# Patient Record
Sex: Female | Born: 1985
Health system: Southern US, Community
[De-identification: ages and names within clinical notes are randomized; demographics above are authoritative.]

## PROBLEM LIST (undated history)

## (undated) DIAGNOSIS — K219 Gastro-esophageal reflux disease without esophagitis: Secondary | ICD-10-CM

## (undated) DIAGNOSIS — K589 Irritable bowel syndrome without diarrhea: Secondary | ICD-10-CM

## (undated) DIAGNOSIS — F329 Major depressive disorder, single episode, unspecified: Secondary | ICD-10-CM

## (undated) DIAGNOSIS — N838 Other noninflammatory disorders of ovary, fallopian tube and broad ligament: Secondary | ICD-10-CM

## (undated) DIAGNOSIS — N946 Dysmenorrhea, unspecified: Secondary | ICD-10-CM

## (undated) DIAGNOSIS — R112 Nausea with vomiting, unspecified: Secondary | ICD-10-CM

## (undated) DIAGNOSIS — R87619 Unspecified abnormal cytological findings in specimens from cervix uteri: Secondary | ICD-10-CM

## (undated) DIAGNOSIS — Z9889 Other specified postprocedural states: Secondary | ICD-10-CM

## (undated) DIAGNOSIS — F32A Depression, unspecified: Secondary | ICD-10-CM

## (undated) DIAGNOSIS — Z8619 Personal history of other infectious and parasitic diseases: Secondary | ICD-10-CM

## (undated) DIAGNOSIS — K209 Esophagitis, unspecified without bleeding: Secondary | ICD-10-CM

## (undated) DIAGNOSIS — K449 Diaphragmatic hernia without obstruction or gangrene: Secondary | ICD-10-CM

## (undated) DIAGNOSIS — F419 Anxiety disorder, unspecified: Secondary | ICD-10-CM

## (undated) HISTORY — PX: CERVICAL BIOPSY  W/ LOOP ELECTRODE EXCISION: SUR135

## (undated) HISTORY — DX: Depression, unspecified: F32.A

## (undated) HISTORY — DX: Personal history of other infectious and parasitic diseases: Z86.19

## (undated) HISTORY — DX: Unspecified abnormal cytological findings in specimens from cervix uteri: R87.619

## (undated) HISTORY — DX: Gastro-esophageal reflux disease without esophagitis: K21.9

## (undated) HISTORY — DX: Major depressive disorder, single episode, unspecified: F32.9

## (undated) HISTORY — DX: Diaphragmatic hernia without obstruction or gangrene: K44.9

## (undated) HISTORY — DX: Irritable bowel syndrome, unspecified: K58.9

## (undated) HISTORY — DX: Anxiety disorder, unspecified: F41.9

## (undated) HISTORY — DX: Dysmenorrhea, unspecified: N94.6

## (undated) HISTORY — DX: Esophagitis, unspecified without bleeding: K20.90

## (undated) HISTORY — PX: COLPOSCOPY: SHX161

---
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
## 2005-05-24 ENCOUNTER — Ambulatory Visit: Payer: Self-pay | Admitting: Family Medicine

## 2005-10-10 ENCOUNTER — Ambulatory Visit: Payer: Self-pay | Admitting: Family Medicine

## 2006-05-08 ENCOUNTER — Ambulatory Visit: Payer: Self-pay | Admitting: Family Medicine

## 2006-05-13 ENCOUNTER — Ambulatory Visit: Payer: Self-pay | Admitting: Family Medicine

## 2006-10-28 HISTORY — PX: WISDOM TOOTH EXTRACTION: SHX21

## 2006-10-29 ENCOUNTER — Ambulatory Visit: Payer: Self-pay | Admitting: Family Medicine

## 2007-06-22 ENCOUNTER — Ambulatory Visit: Payer: Self-pay | Admitting: Family Medicine

## 2007-06-22 DIAGNOSIS — F332 Major depressive disorder, recurrent severe without psychotic features: Secondary | ICD-10-CM | POA: Insufficient documentation

## 2007-06-28 ENCOUNTER — Emergency Department: Payer: Self-pay | Admitting: Emergency Medicine

## 2007-07-07 ENCOUNTER — Ambulatory Visit: Payer: Self-pay | Admitting: Professional

## 2007-09-10 ENCOUNTER — Ambulatory Visit: Payer: Self-pay | Admitting: Family Medicine

## 2007-09-10 DIAGNOSIS — K219 Gastro-esophageal reflux disease without esophagitis: Secondary | ICD-10-CM | POA: Insufficient documentation

## 2007-09-10 DIAGNOSIS — F3132 Bipolar disorder, current episode depressed, moderate: Secondary | ICD-10-CM | POA: Insufficient documentation

## 2007-10-08 ENCOUNTER — Ambulatory Visit: Payer: Self-pay | Admitting: Family Medicine

## 2007-10-08 ENCOUNTER — Encounter: Payer: Self-pay | Admitting: Family Medicine

## 2007-10-08 ENCOUNTER — Other Ambulatory Visit: Admission: RE | Admit: 2007-10-08 | Discharge: 2007-10-08 | Payer: Self-pay | Admitting: Family Medicine

## 2007-10-09 ENCOUNTER — Encounter: Payer: Self-pay | Admitting: Family Medicine

## 2007-10-12 LAB — CONVERTED CEMR LAB: Pap Smear: NORMAL

## 2007-10-13 ENCOUNTER — Encounter (INDEPENDENT_AMBULATORY_CARE_PROVIDER_SITE_OTHER): Payer: Self-pay | Admitting: *Deleted

## 2007-10-13 LAB — CONVERTED CEMR LAB
HCV Ab: NEGATIVE
Hep B Core Total Ab: NEGATIVE
Hep B S Ab: POSITIVE — AB

## 2007-12-17 ENCOUNTER — Ambulatory Visit: Payer: Self-pay | Admitting: Family Medicine

## 2008-01-22 ENCOUNTER — Ambulatory Visit: Payer: Self-pay | Admitting: Family Medicine

## 2008-10-18 ENCOUNTER — Ambulatory Visit: Payer: Self-pay | Admitting: Family Medicine

## 2010-05-30 ENCOUNTER — Ambulatory Visit: Payer: Self-pay | Admitting: Family Medicine

## 2010-07-27 ENCOUNTER — Telehealth (INDEPENDENT_AMBULATORY_CARE_PROVIDER_SITE_OTHER): Payer: Self-pay | Admitting: *Deleted

## 2010-07-27 ENCOUNTER — Ambulatory Visit: Payer: Self-pay | Admitting: Family Medicine

## 2010-07-27 LAB — CONVERTED CEMR LAB
Eosinophils Absolute: 0.1 10*3/uL (ref 0.0–0.7)
Glucose, Bld: 87 mg/dL (ref 70–99)
HCT: 37.8 % (ref 36.0–46.0)
Lymphs Abs: 1.8 10*3/uL (ref 0.7–4.0)
MCHC: 34.4 g/dL (ref 30.0–36.0)
MCV: 88.4 fL (ref 78.0–100.0)
Monocytes Absolute: 0.4 10*3/uL (ref 0.1–1.0)
Neutrophils Relative %: 56.2 % (ref 43.0–77.0)
Platelets: 198 10*3/uL (ref 150.0–400.0)

## 2010-08-08 ENCOUNTER — Other Ambulatory Visit: Admission: RE | Admit: 2010-08-08 | Discharge: 2010-08-08 | Payer: Self-pay | Admitting: Family Medicine

## 2010-08-08 ENCOUNTER — Ambulatory Visit: Payer: Self-pay | Admitting: Family Medicine

## 2010-08-10 LAB — CONVERTED CEMR LAB: Hepatitis B Surface Ag: NEGATIVE

## 2010-08-16 DIAGNOSIS — R87619 Unspecified abnormal cytological findings in specimens from cervix uteri: Secondary | ICD-10-CM | POA: Insufficient documentation

## 2010-08-16 LAB — CONVERTED CEMR LAB

## 2010-09-10 ENCOUNTER — Ambulatory Visit: Payer: Self-pay | Admitting: Obstetrics & Gynecology

## 2010-09-11 ENCOUNTER — Ambulatory Visit: Payer: Self-pay | Admitting: Family Medicine

## 2010-09-26 ENCOUNTER — Ambulatory Visit: Payer: Self-pay | Admitting: Family Medicine

## 2010-09-26 LAB — CONVERTED CEMR LAB
Calcium: 9.8 mg/dL (ref 8.4–10.5)
GFR calc non Af Amer: 108.43 mL/min (ref >60)
Glucose, Bld: 74 mg/dL (ref 70–99)
Potassium: 4.7 meq/L (ref 3.5–5.1)
Sodium: 141 meq/L (ref 135–145)
Vitamin B-12: 393 pg/mL (ref 211–911)

## 2010-10-08 ENCOUNTER — Ambulatory Visit: Payer: Self-pay | Admitting: Obstetrics & Gynecology

## 2010-10-08 ENCOUNTER — Encounter: Payer: Self-pay | Admitting: Obstetrics and Gynecology

## 2010-10-08 LAB — CONVERTED CEMR LAB: Yeast Wet Prep HPF POC: NONE SEEN

## 2010-11-12 ENCOUNTER — Ambulatory Visit
Admission: RE | Admit: 2010-11-12 | Discharge: 2010-11-12 | Payer: Self-pay | Source: Home / Self Care | Attending: Family Medicine | Admitting: Family Medicine

## 2010-11-27 NOTE — Assessment & Plan Note (Signed)
Summary: PAIN IN LEGS  CYD   Vital Signs:  Patient profile:   25 year old female Height:      64.5 inches Weight:      156.75 pounds BMI:     26.59 Temp:     97.3 degrees F oral Pulse rate:   80 / minute Pulse rhythm:   regular BP sitting:   92 / 80  (right arm) Cuff size:   regular  Vitals Entered By: Linde Gillis CMA Duncan Dull) (September 26, 2010 11:21 AM) CC: pain in legs   History of Present Illness: Pt here for pain in the lower legs, mostly in the calf muscles, she denies unusual exercise, work or activity. It has been happening since last Fri (5 days) having it most days, frequent. She is not aware of being able to make it happen. She is on OCPS, this one since 6 mos ago. She is otherwise not on any other medication. She does not take any OTC meds. Working seems to make it worse...standing tends to bother her. 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 No new activity at work in the last 6 weeks. She thinks her job is ok. She has  noticed a little sweeling of the upper ankle of both legs.  She has never really eaten healthy foods. She has not started a new exercise regimen and does not exercise regularly. She has not changed her diet lately.   Problems Prior to Update: 1)  Abnormal Glandular Papanicolaou Smear of Cervix  (ICD-795.00) 2)  Routine Gynecological Examination  (ICD-V72.31) 3)  Screening Examination For Venereal Disease  (ICD-V74.5) 4)  Preventive Health Care  (ICD-V70.0) 5)  Bipolar I D/o Most Recent Epis Depressed Mod  (ICD-296.52) 6)  Gerd  (ICD-530.81) 7)  Depressive Disorder, Rcr, Severe  (ICD-296.33)  Medications Prior to Update: 1)  Sprintec 28 0.25-35 Mg-Mcg Tabs (Norgestimate-Eth Estradiol) .Marland Kitchen.. 1 Tab By Mouth Daily  Allergies (verified): No Known Drug Allergies  Physical Exam  General:  Well-developed,well-nourished,in no acute distress; alert,appropriate and cooperative throughout examination Extremities:  No clubbing,  cyanosis, edema, or deformity noted with normal full range of motion of all joints.  Calves NT to palpation, Homan's negative. No problems moving or getting on or off exam table.   Impression & Recommendations:  Problem # 1:  MUSCLE SPASM, CALVES BILAT (ICD-728.85) Assessment New See instructions. Will get Bmet for metabolites, Mg and B12. Discussed OCPs and clotting but bilat presentation trivializes this.  Increased activity should help. Orders: Venipuncture (11914) TLB-BMP (Basic Metabolic Panel-BMET) (80048-METABOL) TLB-Magnesium (Mg) (83735-MG) TLB-B12, Serum-Total ONLY (78295-A21)  Complete Medication List: 1)  Sprintec 28 0.25-35 Mg-mcg Tabs (Norgestimate-eth estradiol) .Marland Kitchen.. 1 tab by mouth daily  Patient Instructions: 1)  Call for lab results tomm nite.  2)  Try regular vinegar or mustard. 3)  Start regular exercise regimen as discussed. 4)  Be active at work station. 5)  RTC or call if not improved in a few weeks or worsens anytime.   Orders Added: 1)  Venipuncture [36415] 2)  TLB-BMP (Basic Metabolic Panel-BMET) [80048-METABOL] 3)  TLB-Magnesium (Mg) [83735-MG] 4)  TLB-B12, Serum-Total ONLY [82607-B12] 5)  Est. Patient Level III [30865]    Current Allergies (reviewed today): No known allergies

## 2010-11-27 NOTE — Progress Notes (Signed)
----   Converted from flag ---- ---- 07/27/2010 11:00 AM, Kerby Nora MD wrote: Paula Evans ---- 07/27/2010 10:42 AM, Liane Comber CMA (AAMA) wrote: Which labs do you want drawn on pt, she is scheduled for glucose, but it also has a dx code of 280.9 (iron def?) cannot find orders in chart. ------------------------------

## 2010-11-27 NOTE — Assessment & Plan Note (Signed)
Summary: 10 AM BIRTH CONTROL APPT/DLO   Vital Signs:  Patient profile:   25 year old female Height:      64.5 inches Weight:      153.2 pounds BMI:     25.98 Temp:     98.5 degrees F oral Pulse rate:   80 / minute Pulse rhythm:   regular BP sitting:   90 / 60  (left arm) Cuff size:   regular  Vitals Entered By: Benny Lennert CMA Duncan Dull) (May 30, 2010 10:01 AM)  History of Present Illness: Chief complaint birth control  Heavy menses, pain with menses. Cramping, severe..causes her to leave work...mainly on first day. Has always had this issue. She is interested in birth control for regulation of menses.  She is not currently sexually active.  Recent menses finished...lasts 5 days. 4-5 pads each day..soaked.  No lightheadedness. No fatigue.  Never been on birth control before.    No smoking.  No clots personal or family history.  No clotting disorders.  Problems Prior to Update: 1)  Cervicalgia  (ICD-723.1) 2)  Preventive Health Care  (ICD-V70.0) 3)  Bipolar I D/o Most Recent Epis Depressed Mod  (ICD-296.52) 4)  Gerd  (ICD-530.81) 5)  Depressive Disorder, Rcr, Severe  (ICD-296.33)  Current Medications (verified): 1)  Prilosec 20 Mg  Cpdr (Omeprazole) .... Take 1 Tablet By Mouthprn 2)  Zoloft 50 Mg  Tabs (Sertraline Hcl) .... One Tab By Mouth Qd 3)  Flexeril 10 Mg  Tabs (Cyclobenzaprine Hcl) .... 1/2 0-1 Tab By Mouth Once Daily As Needed 4)  Tramadol Hcl 50 Mg Tabs (Tramadol Hcl) .... Take One Tablet By Mouth Every 4 To 6 Hours 5)  Sprintec 28 0.25-35 Mg-Mcg Tabs (Norgestimate-Eth Estradiol) .Marland Kitchen.. 1 Tab By Mouth Daily  Allergies (verified): No Known Drug Allergies  Past History:  Past medical, surgical, family and social histories (including risk factors) reviewed, and no changes noted (except as noted below).  Past Medical History: Reviewed history from 01/22/2008 and no changes required. Current Problems:  PREVENTIVE HEALTH CARE (ICD-V70.0) BIPOLAR I D/O  MOST RECENT EPIS DEPRESSED MOD (ICD-296.52) GERD (ICD-530.81) DEPRESSIVE DISORDER, RCR, SEVERE (ICD-296.33)    Family History: Reviewed history from 10/08/2007 and no changes required. Father: healthy Mother: high cholesterol Siblings: sister healthy PGF: HTN PGM: HTN MGM: breast cancer  Social History: Reviewed history from 10/08/2007 and no changes required. Marital Status: Single Children: None Occupation: Will start [employer] 1/06, [employer] Lives with Mom Never Smoked Alcohol use-no Drug use-no  Review of Systems General:  Denies fatigue and fever. CV:  Denies chest pain or discomfort. Resp:  Denies shortness of breath.  Physical Exam  General:  Well-developed,well-nourished,in no acute distress; alert,appropriate and cooperative throughout examination Mouth:  Oral mucosa and oropharynx without lesions or exudates.  Teeth in good repair. Neck:  no carotid bruit or thyromegaly no cervical or supraclavicular lymphadenopathy  Lungs:  Normal respiratory effort, chest expands symmetrically. Lungs are clear to auscultation, no crackles or wheezes. Heart:  Normal rate and regular rhythm. S1 and S2 normal without gallop, murmur, click, rub or other extra sounds. Abdomen:  Bowel sounds positive,abdomen soft and non-tender without masses, organomegaly or hernias noted.   Impression & Recommendations:  Problem # 1:  DYSFUNCTIONAL UTERINE BLEEDING (ICD-626.8) Will return for pelvic exam.  EWill start on OCP...discussed SE and benifits.   Complete Medication List: 1)  Prilosec 20 Mg Cpdr (Omeprazole) .... Take 1 tablet by mouthprn 2)  Zoloft 50 Mg Tabs (Sertraline hcl) .Marland KitchenMarland KitchenMarland Kitchen  One tab by mouth qd 3)  Flexeril 10 Mg Tabs (Cyclobenzaprine hcl) .... 1/2 0-1 tab by mouth once daily as needed 4)  Tramadol Hcl 50 Mg Tabs (Tramadol hcl) .... Take one tablet by mouth every 4 to 6 hours 5)  Sprintec 28 0.25-35 Mg-mcg Tabs (Norgestimate-eth estradiol) .Marland Kitchen.. 1 tab by mouth daily  Patient  Instructions: 1)  Schedule CPX/PAP in next 1-2 months. Prescriptions: SPRINTEC 28 0.25-35 MG-MCG TABS (NORGESTIMATE-ETH ESTRADIOL) 1 tab by mouth daily  #1 x 2   Entered and Authorized by:   Kerby Nora MD   Signed by:   Kerby Nora MD on 05/30/2010   Method used:   Electronically to        [facility] (retail)       [address]       [place], [place]  [address]       Ph: [phone]       Fax: [phone]   RxID:   [id number]   Current Allergies (reviewed today): No known allergies

## 2010-11-27 NOTE — Assessment & Plan Note (Signed)
Summary: cpx/pap/dlo   Vital Signs:  Patient profile:   25 year old female Height:      64.5 inches Weight:      157.0 pounds BMI:     26.63 Temp:     99.0 degrees F oral Pulse rate:   80 / minute Pulse rhythm:   regular BP sitting:   90 / 60  (left arm) Cuff size:   regular  Vitals Entered By: Benny Lennert CMA Duncan Dull) (August 08, 2010 11:43 AM)  History of Present Illness: Chief complaint cpx with pap  The patient is here for annual wellness exam and preventative care.     GERD, well controlled..off prilosec.  Depression, bipolar stable..not on any medicaiton currently.  Doing well on sprintec...no current sexual activity.  Anemia in past per record (pt does not recall)..resolved..nml Hg   Problems Prior to Update: 1)  Unspecified Iron Deficiency Anemia  (ICD-280.9) 2)  Dysfunctional Uterine Bleeding  (ICD-626.8) 3)  Cervicalgia  (ICD-723.1) 4)  Preventive Health Care  (ICD-V70.0) 5)  Bipolar I D/o Most Recent Epis Depressed Mod  (ICD-296.52) 6)  Gerd  (ICD-530.81) 7)  Depressive Disorder, Rcr, Severe  (ICD-296.33)  Current Medications (verified): 1)  Sprintec 28 0.25-35 Mg-Mcg Tabs (Norgestimate-Eth Estradiol) .Marland Kitchen.. 1 Tab By Mouth Daily  Allergies (verified): No Known Drug Allergies  Past History:  Past medical, surgical, family and social histories (including risk factors) reviewed, and no changes noted (except as noted below).  Past Medical History: Reviewed history from 01/22/2008 and no changes required. Current Problems:  PREVENTIVE HEALTH CARE (ICD-V70.0) BIPOLAR I D/O MOST RECENT EPIS DEPRESSED MOD (ICD-296.52) GERD (ICD-530.81) DEPRESSIVE DISORDER, RCR, SEVERE (ICD-296.33)    Family History: Reviewed history from 10/08/2007 and no changes required. Father: healthy Mother: high cholesterol Siblings: sister healthy PGF: HTN PGM: HTN MGM: breast cancer  Social History: Reviewed history from 10/08/2007 and no changes required. Marital  Status: Single Children: None Occupation: Will start [employer] 1/06, [employer] Lives with Mom Never Smoked Alcohol use-no Drug use-no  Review of Systems General:  Denies fatigue. CV:  Denies chest pain or discomfort. Resp:  Denies shortness of breath. GI:  Complains of abdominal pain; denies bloody stools, constipation, and diarrhea; In last month.Marland Kitchenafter eatng sugary junk food..pulling pain in right side laterally.. GU:  Denies abnormal vaginal bleeding and dysuria. MS:  Denies joint pain; no neck pain seen at urgent care for tendinitis.Marland Kitchenin last month..resolved now.  Physical Exam  General:  Well-developed,well-nourished,in no acute distress; alert,appropriate and cooperative throughout examination Eyes:  No corneal or conjunctival inflammation noted. EOMI. Perrla. Funduscopic exam benign, without hemorrhages, exudates or papilledema. Vision grossly normal. Ears:  External ear exam shows no significant lesions or deformities.  Otoscopic examination reveals clear canals, tympanic membranes are intact bilaterally without bulging, retraction, inflammation or discharge. Hearing is grossly normal bilaterally. Nose:  External nasal examination shows no deformity or inflammation. Nasal mucosa are pink and moist without lesions or exudates. Mouth:  Oral mucosa and oropharynx without lesions or exudates.  Teeth in good repair. Neck:  no carotid bruit or thyromegaly no cervical or supraclavicular lymphadenopathy  Chest Wall:  No deformities, masses, or tenderness noted. Breasts:  No mass, nodules, thickening, tenderness, bulging, retraction, inflamation, nipple discharge or skin changes noted.   Lungs:  Normal respiratory effort, chest expands symmetrically. Lungs are clear to auscultation, no crackles or wheezes. Heart:  Normal rate and regular rhythm. S1 and S2 normal without gallop, murmur, click, rub or other extra sounds. Abdomen:  Bowel  sounds positive,abdomen soft and non-tender without  masses, organomegaly or hernias noted. Genitalia:  Pelvic Exam:        External: normal female genitalia without lesions or masses        Vagina: normal without lesions or masses        Cervix: normal without lesions or masses        Adnexa: normal bimanual exam without masses or fullness        Uterus: normal by palpation        Pap smear: performed Msk:  No deformity or scoliosis noted of thoracic or lumbar spine.   Pulses:  R and L posterior tibial pulses are full and equal bilaterally  Extremities:  no edmea Skin:  Intact without suspicious lesions or rashes Psych:  Cognition and judgment appear intact. Alert and cooperative with normal attention span and concentration. No apparent delusions, illusions, hallucinations   Impression & Recommendations:  Problem # 1:  PREVENTIVE HEALTH CARE (ICD-V70.0) The patient's preventative maintenance and recommended screening tests for an annual wellness exam were reviewed in full today. Brought up to date unless services declined.  Counselled on the importance of diet, exercise, and its role in overall health and mortality. The patient's FH and SH was reviewed, including their home life, tobacco status, and drug and alcohol status.     Problem # 2:  ROUTINE GYNECOLOGICAL EXAMINATION (ICD-V72.31) PAP pending.  STD screening.   Complete Medication List: 1)  Sprintec 28 0.25-35 Mg-mcg Tabs (Norgestimate-eth estradiol) .Marland Kitchen.. 1 tab by mouth daily  Other Orders: T-Hepatitis Acute Panel ([id number]) T-RPR (Syphilis) (09811-91478) T-HIV Antibody  (Reflex) ([id number])  Patient Instructions: 1)  Please schedule a follow-up appointment in 1 year.  2)  Work on [employer], sweets. Work on exercisie 3-5 times a week.  Current Allergies (reviewed today): No known allergies   Last Flu Vaccine:  refused (10/08/2007 10:57:20 AM) Flu Vaccine Result Date:  08/08/2010 Flu Vaccine Result:  at work Flu Vaccine Next Due:  1 yr  Appended  Document: cpx/pap/dlo

## 2011-02-11 ENCOUNTER — Other Ambulatory Visit: Payer: Self-pay | Admitting: Family Medicine

## 2011-03-07 ENCOUNTER — Ambulatory Visit (INDEPENDENT_AMBULATORY_CARE_PROVIDER_SITE_OTHER): Payer: Private Health Insurance - Indemnity

## 2011-03-07 DIAGNOSIS — N760 Acute vaginitis: Secondary | ICD-10-CM

## 2011-03-07 DIAGNOSIS — N898 Other specified noninflammatory disorders of vagina: Secondary | ICD-10-CM

## 2011-03-07 DIAGNOSIS — N899 Noninflammatory disorder of vagina, unspecified: Secondary | ICD-10-CM

## 2014-07-03 ENCOUNTER — Emergency Department: Payer: Self-pay | Admitting: Emergency Medicine

## 2014-07-03 LAB — CBC
HCT: 42.7 % (ref 35.0–47.0)
HGB: 14 g/dL (ref 12.0–16.0)
MCH: 27.5 pg (ref 26.0–34.0)
MCHC: 32.9 g/dL (ref 32.0–36.0)
MCV: 84 fL (ref 80–100)
Platelet: 260 10*3/uL (ref 150–440)
RBC: 5.1 10*6/uL (ref 3.80–5.20)
RDW: 14.1 % (ref 11.5–14.5)
WBC: 6.9 10*3/uL (ref 3.6–11.0)

## 2014-07-03 LAB — BASIC METABOLIC PANEL
ANION GAP: 4 — AB (ref 7–16)
BUN: 11 mg/dL (ref 7–18)
CHLORIDE: 104 mmol/L (ref 98–107)
CO2: 30 mmol/L (ref 21–32)
Calcium, Total: 10 mg/dL (ref 8.5–10.1)
Creatinine: 0.92 mg/dL (ref 0.60–1.30)
EGFR (African American): >60
EGFR (Non-African Amer.): >60
Glucose: 112 mg/dL — ABNORMAL HIGH (ref 65–99)
Osmolality: 276 (ref 275–301)
Potassium: 3.9 mmol/L (ref 3.5–5.1)
SODIUM: 138 mmol/L (ref 136–145)

## 2014-07-03 LAB — TROPONIN I: Troponin-I: <0.02 ng/mL

## 2014-07-04 LAB — D-DIMER(ARMC): D-Dimer: 223 ng/ml

## 2014-07-12 ENCOUNTER — Ambulatory Visit (INDEPENDENT_AMBULATORY_CARE_PROVIDER_SITE_OTHER): Payer: 59 | Admitting: Family Medicine

## 2014-07-12 ENCOUNTER — Encounter: Payer: Self-pay | Admitting: Family Medicine

## 2014-07-12 VITALS — BP 110/80 | HR 75 | Temp 98.3°F | Ht 65.0 in | Wt 203.2 lb

## 2014-07-12 DIAGNOSIS — K219 Gastro-esophageal reflux disease without esophagitis: Secondary | ICD-10-CM

## 2014-07-12 DIAGNOSIS — G5602 Carpal tunnel syndrome, left upper limb: Secondary | ICD-10-CM | POA: Insufficient documentation

## 2014-07-12 DIAGNOSIS — G5600 Carpal tunnel syndrome, unspecified upper limb: Secondary | ICD-10-CM

## 2014-07-12 NOTE — Patient Instructions (Addendum)
Decrease reflux triggers like caffeine, soda, spicy, tomato, junk food, chocolate, citris.  Consider a trial of prilosec 2 x 20 mg tablets daily 2-3 weeks then taper it off. Schedule CPX with fasting labs prior in next few months.  Work on low cholesterol diet.  Wear carpal tunnel brace at night x 2 weeks.  Fat and Cholesterol Control Diet Fat and cholesterol levels in your blood and organs are influenced by your diet. High levels of fat and cholesterol may lead to diseases of the heart, small and large blood vessels, gallbladder, liver, and pancreas. CONTROLLING FAT AND CHOLESTEROL WITH DIET Although exercise and lifestyle factors are important, your diet is key. That is because certain foods are known to raise cholesterol and others to lower it. The goal is to balance foods for their effect on cholesterol and more importantly, to replace saturated and trans fat with other types of fat, such as monounsaturated fat, polyunsaturated fat, and omega-3 fatty acids. On average, a person should consume no more than 15 to 17 g of saturated fat daily. Saturated and trans fats are considered "bad" fats, and they will raise LDL cholesterol. Saturated fats are primarily found in animal products such as meats, butter, and cream. However, that does not mean you need to give up all your favorite foods. Today, there are good tasting, low-fat, low-cholesterol substitutes for most of the things you like to eat. Choose low-fat or nonfat alternatives. Choose round or loin cuts of red meat. These types of cuts are lowest in fat and cholesterol. Chicken (without the skin), fish, veal, and ground [place] breast are great choices. Eliminate fatty meats, such as hot dogs and salami. Even shellfish have little or no saturated fat. Have a 3 oz (85 g) portion when you eat lean meat, poultry, or fish. Trans fats are also called "partially hydrogenated oils." They are oils that have been scientifically manipulated so that they are  solid at room temperature resulting in a longer shelf life and improved taste and texture of foods in which they are added. Trans fats are found in stick margarine, some tub margarines, cookies, crackers, and baked goods.  When baking and cooking, oils are a great substitute for butter. The monounsaturated oils are especially beneficial since it is believed they lower LDL and raise HDL. The oils you should avoid entirely are saturated tropical oils, such as coconut and palm.  Remember to eat a lot from food groups that are naturally free of saturated and trans fat, including fish, fruit, vegetables, beans, grains (barley, rice, couscous, bulgur wheat), and pasta (without cream sauces).  IDENTIFYING FOODS THAT LOWER FAT AND CHOLESTEROL  Soluble fiber may lower your cholesterol. This type of fiber is found in fruits such as apples, vegetables such as broccoli, potatoes, and carrots, legumes such as beans, peas, and lentils, and grains such as barley. Foods fortified with plant sterols (phytosterol) may also lower cholesterol. You should eat at least 2 g per day of these foods for a cholesterol lowering effect.  Read package labels to identify low-saturated fats, trans fat free, and low-fat foods at the supermarket. Select cheeses that have only 2 to 3 g saturated fat per ounce. Use a heart-healthy tub margarine that is free of trans fats or partially hydrogenated oil. When buying baked goods (cookies, crackers), avoid partially hydrogenated oils. Breads and muffins should be made from whole grains (whole-wheat or whole oat flour, instead of "flour" or "enriched flour"). Buy non-creamy canned soups with reduced salt and no  added fats.  FOOD PREPARATION TECHNIQUES  Never deep-fry. If you must fry, either stir-fry, which uses very little fat, or use non-stick cooking sprays. When possible, broil, bake, or roast meats, and steam vegetables. Instead of putting butter or margarine on vegetables, use lemon and herbs,  applesauce, and cinnamon (for squash and sweet potatoes). Use nonfat yogurt, salsa, and low-fat dressings for salads.  LOW-SATURATED FAT / LOW-FAT FOOD SUBSTITUTES Meats / Saturated Fat (g)  Avoid: Steak, marbled (3 oz/85 g) / 11 g  Choose: Steak, lean (3 oz/85 g) / 4 g  Avoid: Hamburger (3 oz/85 g) / 7 g  Choose: Hamburger, lean (3 oz/85 g) / 5 g  Avoid: Ham (3 oz/85 g) / 6 g  Choose: Ham, lean cut (3 oz/85 g) / 2.4 g  Avoid: Chicken, with skin, dark meat (3 oz/85 g) / 4 g  Choose: Chicken, skin removed, dark meat (3 oz/85 g) / 2 g  Avoid: Chicken, with skin, light meat (3 oz/85 g) / 2.5 g  Choose: Chicken, skin removed, light meat (3 oz/85 g) / 1 g Dairy / Saturated Fat (g)  Avoid: Whole milk (1 cup) / 5 g  Choose: Low-fat milk, 2% (1 cup) / 3 g  Choose: Low-fat milk, 1% (1 cup) / 1.5 g  Choose: Skim milk (1 cup) / 0.3 g  Avoid: Hard cheese (1 oz/28 g) / 6 g  Choose: Skim milk cheese (1 oz/28 g) / 2 to 3 g  Avoid: Cottage cheese, 4% fat (1 cup) / 6.5 g  Choose: Low-fat cottage cheese, 1% fat (1 cup) / 1.5 g  Avoid: Ice cream (1 cup) / 9 g  Choose: Sherbet (1 cup) / 2.5 g  Choose: Nonfat frozen yogurt (1 cup) / 0.3 g  Choose: Frozen fruit bar / trace  Avoid: Whipped cream (1 tbs) / 3.5 g  Choose: Nondairy whipped topping (1 tbs) / 1 g Condiments / Saturated Fat (g)  Avoid: Mayonnaise (1 tbs) / 2 g  Choose: Low-fat mayonnaise (1 tbs) / 1 g  Avoid: Butter (1 tbs) / 7 g  Choose: Extra light margarine (1 tbs) / 1 g  Avoid: Coconut oil (1 tbs) / 11.8 g  Choose: Olive oil (1 tbs) / 1.8 g  Choose: Corn oil (1 tbs) / 1.7 g  Choose: Safflower oil (1 tbs) / 1.2 g  Choose: Sunflower oil (1 tbs) / 1.4 g  Choose: Soybean oil (1 tbs) / 2.4 g  Choose: Canola oil (1 tbs) / 1 g Document Released: 10/14/2005 Document Revised: 02/08/2013 Document Reviewed: 01/12/2014 Exit[employer] Patient Information 2015 [place], [place]. This information is not intended to  replace advice given to you by your health care provider. Make sure you discuss any questions you have with your health care provider.
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
## 2014-07-12 NOTE — Progress Notes (Signed)
Pre visit review using our clinic review tool, if applicable. No additional management support is needed unless otherwise documented below in the visit note. 

## 2014-07-12 NOTE — Assessment & Plan Note (Signed)
Most likely cause of symptoms. Treat with nighttime carpal tunnel brace. Avoid positional triggers through the day. If not improving keep appt with neuro.

## 2014-07-12 NOTE — Assessment & Plan Note (Signed)
Trigger avoidance. Start prilosec 40 mg course then taper.

## 2014-09-01 ENCOUNTER — Encounter: Payer: Self-pay | Admitting: Family Medicine

## 2014-09-01 ENCOUNTER — Encounter: Payer: Self-pay | Admitting: *Deleted

## 2014-09-01 ENCOUNTER — Ambulatory Visit (INDEPENDENT_AMBULATORY_CARE_PROVIDER_SITE_OTHER): Payer: 59 | Admitting: Family Medicine

## 2014-09-01 VITALS — BP 110/80 | HR 75 | Temp 98.7°F | Ht 65.0 in | Wt 199.0 lb

## 2014-09-01 DIAGNOSIS — J111 Influenza due to unidentified influenza virus with other respiratory manifestations: Secondary | ICD-10-CM

## 2014-09-01 DIAGNOSIS — J1189 Influenza due to unidentified influenza virus with other manifestations: Secondary | ICD-10-CM

## 2014-09-01 MED ORDER — OSELTAMIVIR PHOSPHATE 75 MG PO CAPS: 75.0000 mg | ORAL_CAPSULE | Freq: Two times a day (BID) | ORAL | Status: DC

## 2014-09-01 NOTE — Progress Notes (Signed)
   Subjective:    Patient ID: Paula Evans, female    DOB: 1986/01/23, 28 y.o.   MRN: [id number]  Fever  This is a new problem. The current episode started yesterday (she had similar symptoms including fever 2 weeks ago as well x few days resolved  and came back). The maximum temperature noted was 102 to 102.9 F. Associated symptoms include congestion, coughing, ear pain, headaches, muscle aches and a sore throat. Pertinent negatives include no abdominal pain, chest pain, diarrhea, urinary pain or wheezing. Associated symptoms comments: Chills, right ear pain, no facial sinus pain, headache . She has tried acetaminophen and NSAIDs (Has taken OTC meds.) for the symptoms.  Cough Associated symptoms include ear pain, a fever, headaches and a sore throat. Pertinent negatives include no chest pain, shortness of breath or wheezing.    Has no none sick contacts.       Review of Systems  Constitutional: Positive for fever. Negative for fatigue.  HENT: Positive for congestion, ear pain and sore throat.   Eyes: Negative for pain.  Respiratory: Positive for cough. Negative for chest tightness, shortness of breath and wheezing.   Cardiovascular: Negative for chest pain, palpitations and leg swelling.  Gastrointestinal: Negative for abdominal pain and diarrhea.  Genitourinary: Negative for dysuria.  Neurological: Positive for headaches.       Objective:   Physical Exam  Constitutional: Vital signs are normal. She appears well-developed and well-nourished. She is cooperative.  Non-toxic appearance. She does not appear ill. No distress.  HENT:  Head: Normocephalic.  Right Ear: Hearing, tympanic membrane, external ear and ear canal normal. Tympanic membrane is not erythematous, not retracted and not bulging.  Left Ear: Hearing, tympanic membrane, external ear and ear canal normal. Tympanic membrane is not erythematous, not retracted and not bulging.  Nose: Mucosal edema and rhinorrhea present.  Right sinus exhibits no maxillary sinus tenderness and no frontal sinus tenderness. Left sinus exhibits no maxillary sinus tenderness and no frontal sinus tenderness.  Mouth/Throat: Uvula is midline, oropharynx is clear and moist and mucous membranes are normal.  Eyes: Conjunctivae, EOM and lids are normal. Pupils are equal, round, and reactive to light. Lids are everted and swept, no foreign bodies found.  Neck: Trachea normal and normal range of motion. Neck supple. Carotid bruit is not present. No thyroid mass and no thyromegaly present.  Cardiovascular: Normal rate, regular rhythm, S1 normal, S2 normal, normal heart sounds, intact distal pulses and normal pulses.  Exam reveals no gallop and no friction rub.   No murmur heard. Pulmonary/Chest: Effort normal and breath sounds normal. No tachypnea. No respiratory distress. She has no decreased breath sounds. She has no wheezes. She has no rhonchi. She has no rales.  Neurological: She is alert.  Skin: Skin is warm, dry and intact. No rash noted.  Psychiatric: Her speech is normal and behavior is normal. Judgment normal. Her mood appears not anxious. Cognition and memory are normal. She does not exhibit a depressed mood.          Assessment & Plan:

## 2014-09-01 NOTE — Patient Instructions (Signed)
Rest, fluids. Complete tamiflu x 5 days. Mucinex DM. Remains out of work until 24 hours after fever resolved off of antipyretics. Tylenol for fever.

## 2014-09-01 NOTE — Progress Notes (Signed)
Pre visit review using our clinic review tool, if applicable. No additional management support is needed unless otherwise documented below in the visit note. 

## 2014-09-08 DIAGNOSIS — R87610 Atypical squamous cells of undetermined significance on cytologic smear of cervix (ASC-US): Secondary | ICD-10-CM | POA: Insufficient documentation

## 2014-10-06 DIAGNOSIS — J111 Influenza due to unidentified influenza virus with other respiratory manifestations: Secondary | ICD-10-CM | POA: Insufficient documentation

## 2014-10-06 NOTE — Assessment & Plan Note (Signed)
Discussed symptomatic care.  Hydration, rest. Call if SOB, cough worsening or prolongued fever. Reviewed flu timeline. She has decided to use use of tamiflu. Discussed family prophylaxis, her children will call pediatrition to consider tamiflu prophylaxis. She was advised to not return to work until 24 hour after fever resolved on no antipyretics.

## 2014-11-01 ENCOUNTER — Telehealth: Payer: Self-pay | Admitting: Family Medicine

## 2014-11-01 DIAGNOSIS — Z13220 Encounter for screening for lipoid disorders: Secondary | ICD-10-CM

## 2014-11-01 NOTE — Telephone Encounter (Signed)
-----   Message from Ellamae Sia sent at 10/25/2014  2:08 PM EST ----- Regarding: Lab orders for Wednesday, 1.6.15 Patient is scheduled for CPX labs, please order future labs, Thanks , Karna Christmas

## 2014-11-02 ENCOUNTER — Other Ambulatory Visit (INDEPENDENT_AMBULATORY_CARE_PROVIDER_SITE_OTHER): Payer: 59

## 2014-11-02 DIAGNOSIS — Z13220 Encounter for screening for lipoid disorders: Secondary | ICD-10-CM

## 2014-11-02 LAB — COMPREHENSIVE METABOLIC PANEL
ALBUMIN: 3.7 g/dL (ref 3.5–5.2)
ALK PHOS: 40 U/L (ref 39–117)
ALT: 15 U/L (ref 0–35)
AST: 20 U/L (ref 0–37)
BUN: 10 mg/dL (ref 6–23)
CALCIUM: 8.9 mg/dL (ref 8.4–10.5)
CHLORIDE: 107 meq/L (ref 96–112)
CO2: 26 meq/L (ref 19–32)
CREATININE: 0.8 mg/dL (ref 0.4–1.2)
GFR: 91.9 mL/min (ref >60.00)
GLUCOSE: 95 mg/dL (ref 70–99)
POTASSIUM: 4.2 meq/L (ref 3.5–5.1)
Sodium: 137 mEq/L (ref 135–145)
Total Bilirubin: 0.4 mg/dL (ref 0.2–1.2)
Total Protein: 6.9 g/dL (ref 6.0–8.3)

## 2014-11-02 LAB — LIPID PANEL
Cholesterol: 177 mg/dL (ref 0–200)
HDL: 44.7 mg/dL (ref >39.00)
LDL Cholesterol: 108 mg/dL — ABNORMAL HIGH (ref 0–99)
NonHDL: 132.3
Total CHOL/HDL Ratio: 4
Triglycerides: 121 mg/dL (ref 0.0–149.0)
VLDL: 24.2 mg/dL (ref 0.0–40.0)

## 2014-11-08 ENCOUNTER — Encounter: Payer: Self-pay | Admitting: Family Medicine

## 2014-11-08 ENCOUNTER — Ambulatory Visit (INDEPENDENT_AMBULATORY_CARE_PROVIDER_SITE_OTHER): Payer: 59 | Admitting: Family Medicine

## 2014-11-08 VITALS — BP 108/70 | HR 64 | Temp 98.6°F | Ht 65.5 in | Wt 199.5 lb

## 2014-11-08 DIAGNOSIS — K5900 Constipation, unspecified: Secondary | ICD-10-CM

## 2014-11-08 DIAGNOSIS — M5417 Radiculopathy, lumbosacral region: Secondary | ICD-10-CM

## 2014-11-08 DIAGNOSIS — M5416 Radiculopathy, lumbar region: Secondary | ICD-10-CM | POA: Insufficient documentation

## 2014-11-08 MED ORDER — DICLOFENAC SODIUM 75 MG PO TBEC: 75.0000 mg | DELAYED_RELEASE_TABLET | Freq: Two times a day (BID) | ORAL | Status: DC

## 2014-11-08 NOTE — Assessment & Plan Note (Signed)
Due to poor diet. Increase water fiber and exercise.

## 2014-11-08 NOTE — Patient Instructions (Addendum)
Increase exercise as able. Goal 150 min per week. Work on healthy eating, increase fiber and water. Start home back physical therapy. Start course of diclofenac twice daily x 1-2 weeks.  Limit lifting > 10 lbs and bending at waist x 2 weeks. If back pain not improving follow up in 2 weeks.

## 2014-11-08 NOTE — Assessment & Plan Note (Signed)
NSAIDs, heat, Start home PT. Recommended increasing core strength. No indication of X-ray at this time.

## 2014-11-08 NOTE — Progress Notes (Signed)
Pre visit review using our clinic review tool, if applicable. No additional management support is needed unless otherwise documented below in the visit note. 

## 2014-11-08 NOTE — Progress Notes (Signed)
   Subjective:    Patient ID: Paula Evans, female    DOB: 1985/11/25, 29 y.o.   MRN: [id number]   HPI 29 year old female presents for annual eval of chronic medical issues.   She has occ pain in low back, about 5 days a week.  Has shooting pain in right buttock when at work.  Pain is constant through the day if moving. Better at rest sitting. No known injury. She does have to lift 20 lb boxes at times. Occ uses aleve.   She sees a GYN for her pelvic and breast exam.  Labs reviewed in detail with pt. Lab Results  Component Value Date   CHOL 177 11/02/2014   HDL 44.70 11/02/2014   LDLCALC 108* 11/02/2014   TRIG 121.0 11/02/2014   CHOLHDL 4 11/02/2014    Diet: poor diet, limited water and fiber. Occ exercise on bike.  Review of Systems  Constitutional: Negative for fever and fatigue.  HENT: Negative for ear pain.   Eyes: Negative for pain.  Respiratory: Negative for chest tightness and shortness of breath.   Cardiovascular: Negative for chest pain, palpitations and leg swelling.  Gastrointestinal: Positive for constipation. Negative for abdominal pain.       Milk of mag helped.  Genitourinary: Negative for dysuria.       Objective:   Physical Exam  Constitutional: Vital signs are normal. She appears well-developed and well-nourished. She is cooperative.  Non-toxic appearance. She does not appear ill. No distress.  HENT:  Head: Normocephalic.  Right Ear: Hearing, tympanic membrane, external ear and ear canal normal. Tympanic membrane is not erythematous, not retracted and not bulging.  Left Ear: Hearing, tympanic membrane, external ear and ear canal normal. Tympanic membrane is not erythematous, not retracted and not bulging.  Nose: No mucosal edema or rhinorrhea. Right sinus exhibits no maxillary sinus tenderness and no frontal sinus tenderness. Left sinus exhibits no maxillary sinus tenderness and no frontal sinus tenderness.  Mouth/Throat: Uvula is midline, oropharynx  is clear and moist and mucous membranes are normal.  Eyes: Conjunctivae, EOM and lids are normal. Pupils are equal, round, and reactive to light. Lids are everted and swept, no foreign bodies found.  Neck: Trachea normal and normal range of motion. Neck supple. Carotid bruit is not present. No thyroid mass and no thyromegaly present.  Cardiovascular: Normal rate, regular rhythm, S1 normal, S2 normal, normal heart sounds, intact distal pulses and normal pulses.  Exam reveals no gallop and no friction rub.   No murmur heard. Pulmonary/Chest: Effort normal and breath sounds normal. No tachypnea. No respiratory distress. She has no decreased breath sounds. She has no wheezes. She has no rhonchi. She has no rales.  Abdominal: Soft. Normal appearance and bowel sounds are normal. There is no tenderness.  Musculoskeletal:       Lumbar back: She exhibits decreased range of motion and tenderness. She exhibits no bony tenderness.  Neg faber's, neg SLR  Neurological: She is alert. She has normal strength. No cranial nerve deficit or sensory deficit. She displays a negative Romberg sign. Gait normal.  Skin: Skin is warm, dry and intact. No rash noted.  Psychiatric: Her speech is normal and behavior is normal. Judgment and thought content normal. Her mood appears not anxious. Cognition and memory are normal. She does not exhibit a depressed mood.          Assessment & Plan:

## 2015-03-27 ENCOUNTER — Telehealth: Payer: Self-pay | Admitting: Family

## 2015-03-27 DIAGNOSIS — B3731 Acute candidiasis of vulva and vagina: Secondary | ICD-10-CM

## 2015-03-27 DIAGNOSIS — B373 Candidiasis of vulva and vagina: Secondary | ICD-10-CM

## 2015-03-27 MED ORDER — FLUCONAZOLE 150 MG PO TABS: 150.0000 mg | ORAL_TABLET | Freq: Once | ORAL | Status: DC

## 2015-03-27 NOTE — Progress Notes (Signed)
We are sorry that you are not feeling well. Here is how we plan to help! Based on what you shared with me it looks like you: May have a yeast vaginosis  Vaginosis is an inflammation of the vagina that can result in discharge, itching and pain. The cause is usually a change in the normal balance of vaginal bacteria or an infection. Vaginosis can also result from reduced estrogen levels after menopause.  The most common causes of vaginosis are:   Bacterial vaginosis which results from an overgrowth of one on several organisms that are normally present in your vagina.   Yeast infections which are caused by a naturally occurring fungus called candida.   Vaginal atrophy (atrophic vaginosis) which results from the thinning of the vagina from reduced estrogen levels after menopause.   Trichomoniasis which is caused by a parasite and is commonly transmitted by sexual intercourse.  Factors that increase your risk of developing vaginosis include: Marland Kitchen Medications, such as antibiotics and steroids . Uncontrolled diabetes . Use of hygiene products such as bubble bath, vaginal spray or vaginal deodorant . Douching . Wearing damp or tight-fitting clothing . Using an intrauterine device (IUD) for birth control . Hormonal changes, such as those associated with pregnancy, birth control pills or menopause . Sexual activity . Having a sexually transmitted infection  Your treatment plan is A single Diflucan (fluconazole) 150mg  tablet once.  I have electronically sent this prescription into the pharmacy that you have chosen.   You need to follow up with your primary care provider about the vaginal lesions.  Be sure to take all of the medication as directed. Stop taking any medication if you develop a rash, tongue swelling or shortness of breath. Mothers who are breast feeding should consider pumping and discarding their breast milk while on these antibiotics. However, there is no consensus that infant exposure  at these doses would be harmful.  Remember that medication creams can weaken latex condoms. Marland Kitchen   HOME CARE:  Good hygiene may prevent some types of vaginosis from recurring and may relieve some symptoms:  . Avoid baths, hot tubs and whirlpool spas. Rinse soap from your outer genital area after a shower, and dry the area well to prevent irritation. Don't use scented or harsh soaps, such as those with deodorant or antibacterial action. Marland Kitchen Avoid irritants. These include scented tampons and pads. . Wipe from front to back after using the toilet. Doing so avoids spreading fecal bacteria to your vagina.  Other things that may help prevent vaginosis include:  Marland Kitchen Don't douche. Your vagina doesn't require cleansing other than normal bathing. Repetitive douching disrupts the normal organisms that reside in the vagina and can actually increase your risk of vaginal infection. Douching won't clear up a vaginal infection. . Use a latex condom. Both female and female latex condoms may help you avoid infections spread by sexual contact. . Wear cotton underwear. Also wear pantyhose with a cotton crotch. If you feel comfortable without it, skip wearing underwear to bed. Yeast thrives in [employer] Your symptoms should improve in the next day or two.  GET HELP RIGHT AWAY IF:  . You have pain in your lower abdomen ( pelvic area or over your ovaries) . You develop nausea or vomiting . You develop a fever . Your discharge changes or worsens . You have persistent pain with intercourse . You develop shortness of breath, a rapid pulse, or you faint.  These symptoms could be signs of problems or infections  that need to be evaluated by a medical provider now.  MAKE SURE YOU    Understand these instructions.  Will watch your condition.  Will get help right away if you are not doing well or get worse.   Your e-visit answers were reviewed by a board certified advanced clinical practitioner to complete  your personal care plan. Depending upon the condition, your plan could have included both over the counter or prescription medications. Please review your pharmacy choice to make sure that you have choses a pharmacy that is open for you to pick up any needed prescription, Your safety is important to [place]. If you have drug allergies check your prescription carefully.  You can use MyChart to ask questions about today's visit, request a non-urgent call back, or ask for a work or school excuse. You will get a MyChart message within the next two days asking about your experience. I hope that your e-visit has been valuable and will speed your recovery.

## 2015-03-27 NOTE — Addendum Note (Signed)
Addended by: Evelina Dun A on: 03/27/2015 03:08 PM   Modules accepted: Orders

## 2015-05-09 ENCOUNTER — Encounter: Payer: Self-pay | Admitting: Obstetrics and Gynecology

## 2015-05-09 ENCOUNTER — Ambulatory Visit (INDEPENDENT_AMBULATORY_CARE_PROVIDER_SITE_OTHER): Payer: 59 | Admitting: Obstetrics and Gynecology

## 2015-05-09 VITALS — BP 119/76 | HR 87 | Ht 65.5 in | Wt 192.0 lb

## 2015-05-09 DIAGNOSIS — N871 Moderate cervical dysplasia: Secondary | ICD-10-CM

## 2015-05-09 MED ORDER — DIAZEPAM 5 MG PO TABS: 5.0000 mg | ORAL_TABLET | Freq: Three times a day (TID) | ORAL | Status: DC | PRN

## 2015-05-09 MED ORDER — NORETHIN-ETH ESTRAD-FE BIPHAS 1 MG-10 MCG / 10 MCG PO TABS: 1.0000 | ORAL_TABLET | Freq: Every day | ORAL | Status: DC

## 2015-05-09 NOTE — Patient Instructions (Signed)
Call back once completed sample pills to get prescription .

## 2015-05-09 NOTE — Progress Notes (Signed)
GYNECOLOGY PROGRESS NOTE  Subjective:    Patient ID: Paula Evans, female    DOB: Aug 14, 1986, 29 y.o.   MRN: [id number]  HPI  Patient is a 29 y.o. G0P0000 female who presents for f/u pap smear with ECC.  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  Had LEEP with results of CIN II with positive margins (12/19/14).     Patient also complains of increased yeast infections and vaginal dryness on OCPs.  Discontinued pills 2 months ago, with improvement in symptoms. Desires to discuss alternative contraceptive management for heavy menses.  Lastly, patient desires a short supply of anxiety medication as she is taking a trip to [place] and has a h/o anxiety, noting a panic attack on last lengthy plane ride.   The following portions of the patient's history were reviewed and updated as appropriate: allergies, current medications, past family history, past medical history, past social history, past surgical history and problem list.  Review of Systems Pertinent items are noted in HPI.   Objective:    Blood pressure 119/76, pulse 87, height 5' 5.5" (1.664 m), weight 192 lb (87.091 kg), last menstrual period 04/16/2015. General appearance: alert and no distress Abdomen: soft, non-tender; bowel sounds normal; no masses,  no organomegaly Pelvic: cervix normal in appearance, external genitalia normal, rectovaginal septum normal and vagina normal without discharge Extremities: extremities normal, atraumatic, no cyanosis or edema Neurologic: Grossly normal   Assessment:   CIN II Heavy menses Anxiety  Plan:   Pap smear with ECC done today.  Needs to have pap with ECC q 4 months until 2 negative results.  Discussion had on other management options for heavy menses, including changing to differen (lower dose) OCP, or different method altogether (IUD, Nexplanon, Depo Provera, contraceptive patch or vaginal ring).  Patient desires to try lower  dose contraceptive pill.  Given 2 month sample of Lo Loestrin to begin after onset of next cycle. To call if medication working well to get prescription called in.  Will prescribe short supply of Valium for plane trip.   RTC in 4 months.   Rubie Maid, MD [facility]

## 2015-05-10 LAB — PAP IG W/ RFLX HPV ASCU: PAP Smear Comment: 0

## 2015-05-11 LAB — PATHOLOGY

## 2015-05-12 ENCOUNTER — Telehealth: Payer: Self-pay

## 2015-05-12 NOTE — Telephone Encounter (Signed)
-----   Message from Rubie Maid, MD sent at 05/11/2015  1:55 PM EDT ----- Please inform patient of negative pap smear and ECC. For repeat in 4-6 months as previously scheduled.

## 2015-05-12 NOTE — Telephone Encounter (Signed)
Pt informed of negative results.

## 2015-08-25 ENCOUNTER — Telehealth: Payer: 59 | Admitting: Neurology

## 2015-08-25 DIAGNOSIS — N760 Acute vaginitis: Secondary | ICD-10-CM | POA: Diagnosis not present

## 2015-08-25 DIAGNOSIS — A499 Bacterial infection, unspecified: Secondary | ICD-10-CM

## 2015-08-25 DIAGNOSIS — B9689 Other specified bacterial agents as the cause of diseases classified elsewhere: Secondary | ICD-10-CM

## 2015-08-25 MED ORDER — FLUCONAZOLE 150 MG PO TABS: 150.0000 mg | ORAL_TABLET | Freq: Once | ORAL | Status: DC

## 2015-08-25 NOTE — Progress Notes (Signed)
ok 

## 2015-08-25 NOTE — Progress Notes (Signed)

## 2015-09-12 ENCOUNTER — Ambulatory Visit (INDEPENDENT_AMBULATORY_CARE_PROVIDER_SITE_OTHER): Payer: 59 | Admitting: Obstetrics and Gynecology

## 2015-09-12 VITALS — BP 106/69 | HR 72 | Ht 65.5 in | Wt 176.3 lb

## 2015-09-12 DIAGNOSIS — Z01419 Encounter for gynecological examination (general) (routine) without abnormal findings: Secondary | ICD-10-CM | POA: Diagnosis not present

## 2015-09-12 DIAGNOSIS — R87610 Atypical squamous cells of undetermined significance on cytologic smear of cervix (ASC-US): Secondary | ICD-10-CM

## 2015-09-12 DIAGNOSIS — Z113 Encounter for screening for infections with a predominantly sexual mode of transmission: Secondary | ICD-10-CM

## 2015-09-12 DIAGNOSIS — Z8742 Personal history of other diseases of the female genital tract: Secondary | ICD-10-CM

## 2015-09-13 ENCOUNTER — Encounter: Payer: Self-pay | Admitting: Obstetrics and Gynecology

## 2015-09-13 NOTE — Progress Notes (Signed)
Subjective:    Paula Evans is a 29 y.o. P0 female who presents for an annual exam. The patient has no complaints today. The patient is sexually active (female partners only). GYN screening history: last pap: was normal and approximate date 04/2015 and was normal (however pap smear in 08/2014 was ASCUS HR HPV with CIN II on cervical biopsy). The patient wears seatbelts: yes. The patient participates in regular exercise: yes. Has the patient ever been transfused or tattooed?: yes. The patient reports that there is not domestic violence in her life.   Menstrual History: OB History    Gravida Para Term Preterm AB TAB SAB Ectopic Multiple Living   0 0 0 0 0 0 0 0 0 0       Menarche age: 24  Patient's last menstrual period was 09/07/2015.  Notes normal regular menses (took OCPs x 1 month then discontinued)  Denies h/o of STIs. Does note h/o abnormal pap smears  Past Medical History  Diagnosis Date  . History of chicken pox   . GERD (gastroesophageal reflux disease)   . Abnormal Pap smear of cervix   . Dysmenorrhea   . Anxiety     Family History  Problem Relation Age of Onset  . Hyperlipidemia Mother   . Alcohol abuse Father   . Lung cancer Paternal Uncle   . Diabetes Sister     prediabetes   Past Surgical History  Procedure Laterality Date  . Wisdom tooth extraction  2008  . Cervical biopsy  w/ loop electrode excision  11/2014  . Colposcopy  10/2014    Social History   Social History  . Marital Status: Single    Spouse Name: N/A  . Number of Children: N/A  . Years of Education: N/A   Occupational History  . dining services [employer]   Social History Main Topics  . Smoking status: Never Smoker   . Smokeless tobacco: Never Used  . Alcohol Use: 1.2 oz/week    2 Standard drinks or equivalent per week  . Drug Use: No  . Sexual Activity:    Partners: Female    Museum/gallery curator: None   Other Topics Concern  . Not on file   Social History Narrative   Diet: junk  food, water, some fruits and veggies, minimum calcium.    Medications: None  No Known Allergies  Review of Systems A comprehensive review of systems was negative.    Objective:    BP 106/69 mmHg  Pulse 72  Ht 5' 5.5" (1.664 m)  Wt 176 lb 4.8 oz (79.969 kg)  BMI 28.88 kg/m2  LMP 09/07/2015.    General Appearance:    Alert, cooperative, no distress, appears stated age  Head:    Normocephalic, without obvious abnormality, atraumatic  Eyes:    PERRL, conjunctiva/corneas clear, EOM's intact, both eyes  Ears:    Normal external ear canals, both ears  Nose:   Nares normal, septum midline, mucosa normal, no drainage or sinus tenderness  Throat:   Lips, mucosa, and tongue normal; teeth and gums normal  Neck:   Supple, symmetrical, trachea midline, no adenopathy; thyroid: no enlargement/tenderness/nodules; no carotid bruit or JVD  Back:     Symmetric, no curvature, ROM normal, no CVA tenderness  Lungs:     Clear to auscultation bilaterally, respirations unlabored  Chest Wall:    No tenderness or deformity   Heart:    Regular rate and rhythm, S1 and S2 normal, no murmur, rub or gallop  Breast Exam:    No tenderness, masses, or nipple abnormality  Abdomen:     Soft, non-tender, bowel sounds active all four quadrants, no masses, no organomegaly.   Genitalia:    Pelvic:external genitalia normal, vagina without lesions, discharge, or tenderness, rectovaginal septum  normal. Cervix normal in appearance, no cervical motion tenderness, no adnexal masses or tenderness.  Uterus normal shape, size, consistency, nontender.   Rectal:    Normal external sphincter.  No hemorrhoids appreciated. Internal exam not done.   Extremities:   Extremities normal, atraumatic, no cyanosis or edema  Pulses:   2+ and symmetric all extremities  Skin:   Skin color, texture, turgor normal, no rashes or lesions  Lymph nodes:   Cervical, supraclavicular, and axillary nodes normal  Neurologic:   CNII-XII intact, normal  strength, sensation and reflexes throughout    Assessment:    Healthy female exam.    Plan:     Blood tests: Basic metabolic panel and CBC with diff. Breast self exam technique reviewed and patient encouraged to perform self-exam monthly. Contraception: none.  In same sex relationship. Counseled on safe sex practices. Discussed healthy lifestyle modifications. Endocervical curettage. Thin prep Pap smear.   Desires STD testing.  Will perform.  Follow up in 1 year.    Rubie Maid, MD [facility]

## 2015-09-13 NOTE — Addendum Note (Signed)
Addended by: Gordy Clement C on: 09/13/2015 11:19 AM   Modules accepted: Orders

## 2015-09-14 ENCOUNTER — Ambulatory Visit: Payer: 59 | Admitting: Obstetrics and Gynecology

## 2015-09-14 LAB — BASIC METABOLIC PANEL
BUN / CREAT RATIO: 10 (ref 8–20)
BUN: 8 mg/dL (ref 6–20)
CHLORIDE: 101 mmol/L (ref 97–106)
CO2: 25 mmol/L (ref 18–29)
Calcium: 9.3 mg/dL (ref 8.7–10.2)
Creatinine, Ser: 0.81 mg/dL (ref 0.57–1.00)
GFR, EST AFRICAN AMERICAN: 114 mL/min/{1.73_m2} (ref >59)
GFR, EST NON AFRICAN AMERICAN: 98 mL/min/{1.73_m2} (ref >59)
Glucose: 76 mg/dL (ref 65–99)
POTASSIUM: 4.6 mmol/L (ref 3.5–5.2)
SODIUM: 140 mmol/L (ref 136–144)

## 2015-09-14 LAB — RPR QUALITATIVE: RPR Ser Ql: NONREACTIVE

## 2015-09-14 LAB — CBC
HEMATOCRIT: 38.8 % (ref 34.0–46.6)
Hemoglobin: 12.5 g/dL (ref 11.1–15.9)
MCH: 25.1 pg — AB (ref 26.6–33.0)
MCHC: 32.2 g/dL (ref 31.5–35.7)
MCV: 78 fL — AB (ref 79–97)
PLATELETS: 332 10*3/uL (ref 150–379)
RBC: 4.98 x10E6/uL (ref 3.77–5.28)
RDW: 15.5 % — AB (ref 12.3–15.4)
WBC: 5.5 10*3/uL (ref 3.4–10.8)

## 2015-09-14 LAB — HIV-2 ANTIBODIES: HIV-2 Ab-O.D. Ratio: NEGATIVE (ref <1.00)

## 2015-09-14 LAB — HEPATITIS B SURFACE ANTIGEN: Hepatitis B Surface Ag: NEGATIVE

## 2015-09-15 LAB — GC/CHLAMYDIA PROBE AMP
Chlamydia trachomatis, NAA: NEGATIVE
Neisseria gonorrhoeae by PCR: NEGATIVE

## 2015-09-15 LAB — PATHOLOGY

## 2015-09-19 ENCOUNTER — Telehealth: Payer: Self-pay

## 2015-09-20 ENCOUNTER — Telehealth: Payer: Self-pay

## 2015-09-20 LAB — PAP IG AND HPV HIGH-RISK
HPV, high-risk: NEGATIVE
PAP SMEAR COMMENT: 0

## 2015-09-20 NOTE — Telephone Encounter (Signed)
Pt informed of normal labs.

## 2015-09-20 NOTE — Telephone Encounter (Signed)
-----   Message from Rubie Maid, MD sent at 09/15/2015  8:32 PM EST ----- Please inform patient that her pap with ECC, annual labs, and STD screening were normal.  Can now go to yearly cervical cancer screening.

## 2015-09-20 NOTE — Telephone Encounter (Signed)
ERROR

## 2015-11-29 ENCOUNTER — Telehealth: Payer: 59 | Admitting: Family

## 2015-11-29 DIAGNOSIS — B3731 Acute candidiasis of vulva and vagina: Secondary | ICD-10-CM

## 2015-11-29 DIAGNOSIS — B373 Candidiasis of vulva and vagina: Secondary | ICD-10-CM

## 2015-11-29 MED ORDER — FLUCONAZOLE 150 MG PO TABS: 150.0000 mg | ORAL_TABLET | Freq: Once | ORAL | Status: DC

## 2015-11-29 NOTE — Progress Notes (Signed)

## 2016-03-05 ENCOUNTER — Encounter: Payer: Self-pay | Admitting: Physician Assistant

## 2016-03-05 ENCOUNTER — Ambulatory Visit: Payer: Self-pay | Admitting: Physician Assistant

## 2016-03-05 VITALS — BP 90/72 | HR 80 | Temp 98.6°F

## 2016-03-05 DIAGNOSIS — F3130 Bipolar disorder, current episode depressed, mild or moderate severity, unspecified: Secondary | ICD-10-CM

## 2016-03-05 MED ORDER — SERTRALINE HCL 50 MG PO TABS: 50.0000 mg | ORAL_TABLET | Freq: Every day | ORAL | Status: DC

## 2016-03-05 MED ORDER — ARIPIPRAZOLE 5 MG PO TABS: 5.0000 mg | ORAL_TABLET | Freq: Every day | ORAL | Status: DC

## 2016-03-05 NOTE — Progress Notes (Signed)
S: hx of bipolar disorder, made an emergency call to [facility] yesterday, they recommend getting back on medication with f/u for counseling with them, has pcp at [place]; denies si/hi, states she use to be on abilify and ?klonopin, not sure but zoloft sounded familiar too  O: vitals wnl, nad, pt is quiet, appears sad, lungs c t a, cv rrr, neuro intact  A: bipolar d/o  P: abilify 5mg  qd, zoloft 50mg  qd, if worsening while on medication stop the meds and come to clinic or go to ER asap, pt to f/u with [facility] and ?dr Nicolasa Ducking if meds not helping

## 2016-05-10 ENCOUNTER — Telehealth: Payer: 59 | Admitting: Nurse Practitioner

## 2016-05-10 DIAGNOSIS — M545 Low back pain: Secondary | ICD-10-CM | POA: Diagnosis not present

## 2016-05-10 MED ORDER — CYCLOBENZAPRINE HCL 10 MG PO TABS: 10.0000 mg | ORAL_TABLET | Freq: Three times a day (TID) | ORAL | Status: DC | PRN

## 2016-05-10 MED ORDER — NAPROXEN 500 MG PO TABS: 500.0000 mg | ORAL_TABLET | Freq: Two times a day (BID) | ORAL | Status: DC

## 2016-05-10 NOTE — Progress Notes (Signed)

## 2016-05-24 ENCOUNTER — Ambulatory Visit: Payer: Self-pay | Admitting: Family Medicine

## 2016-05-27 ENCOUNTER — Ambulatory Visit: Payer: Self-pay | Admitting: Family Medicine

## 2016-05-31 ENCOUNTER — Telehealth: Payer: Self-pay | Admitting: Family Medicine

## 2016-05-31 ENCOUNTER — Ambulatory Visit (INDEPENDENT_AMBULATORY_CARE_PROVIDER_SITE_OTHER): Payer: 59 | Admitting: Family Medicine

## 2016-05-31 ENCOUNTER — Encounter (HOSPITAL_COMMUNITY): Payer: Self-pay

## 2016-05-31 ENCOUNTER — Encounter: Payer: Self-pay | Admitting: Family Medicine

## 2016-05-31 VITALS — BP 112/69 | HR 64 | Temp 98.5°F | Ht 65.5 in | Wt 175.8 lb

## 2016-05-31 DIAGNOSIS — F3132 Bipolar disorder, current episode depressed, moderate: Secondary | ICD-10-CM

## 2016-05-31 DIAGNOSIS — F332 Major depressive disorder, recurrent severe without psychotic features: Secondary | ICD-10-CM

## 2016-05-31 MED ORDER — QUETIAPINE FUMARATE 25 MG PO TABS: 25.0000 mg | ORAL_TABLET | Freq: Every day | ORAL | 3 refills | Status: DC

## 2016-05-31 NOTE — Telephone Encounter (Signed)
Pt placed on [facility] WQ. Pt is aware they will call her to schedule her psychiatry appt. Pt has all of the info for her to call as well.

## 2016-05-31 NOTE — Assessment & Plan Note (Signed)
Needs mood stabilizer. Will start seroquel and refer to psychiatry for further recommendations.

## 2016-05-31 NOTE — Assessment & Plan Note (Signed)
Significant improvement with sertraline.. But ? SE of fatigue or still not ideally controlled depression.  Will add Seroquel as mood stabilizer as well as antidepressant adjunct.

## 2016-05-31 NOTE — Patient Instructions (Signed)
Continue sertaline.  Start Seroquel at bedtime.  Stop at front desk to set up psychiatrist referral.

## 2016-05-31 NOTE — Progress Notes (Signed)
   Subjective:    Patient ID: Paula Evans, female    DOB: [birth year], 30 y.o.   MRN: [id number]  HPI  30 year old female with history of bipolar disorder and major depression present for follow up recent OV at work [facility]) with a PA, Ashok Cordia.   She had been off medication for 5-6 years. Had been doing well until earlier this year about 3-4 months ago.  Felt loss of appetite, depression, moody, decreased motivation, anhedonia. She did have some SI, no plan at that time. Was triggered with stress at work and " bad things"  Started on sertraline and ablify on 03/05/2016 She stopped Abilify because it was making her feel ill,paniky.  At this point she reports she feels 100% better overall from 02/2016. NO SI. She feels sertraline has decreased her energy and it makes her tired.  She has some issues focusing. No symptoms of mania on  SSRI alone.. No hallucinations, no excessive highs.  PHQ9: 10   Review of Systems  Constitutional: Positive for fatigue. Negative for unexpected weight change.  HENT: Negative for ear pain.   Eyes: Negative for pain.  Respiratory: Negative for shortness of breath.   Cardiovascular: Negative for chest pain.       Objective:   Physical Exam  Constitutional: Vital signs are normal. She appears well-developed and well-nourished. She is cooperative.  Non-toxic appearance. She does not appear ill. No distress.  HENT:  Head: Normocephalic.  Right Ear: Hearing, tympanic membrane, external ear and ear canal normal. Tympanic membrane is not erythematous, not retracted and not bulging.  Left Ear: Hearing, tympanic membrane, external ear and ear canal normal. Tympanic membrane is not erythematous, not retracted and not bulging.  Nose: No mucosal edema or rhinorrhea. Right sinus exhibits no maxillary sinus tenderness and no frontal sinus tenderness. Left sinus exhibits no maxillary sinus tenderness and no frontal sinus tenderness.  Mouth/Throat: Uvula is midline,  oropharynx is clear and moist and mucous membranes are normal.  Eyes: Conjunctivae, EOM and lids are normal. Pupils are equal, round, and reactive to light. Lids are everted and swept, no foreign bodies found.  Neck: Trachea normal and normal range of motion. Neck supple. Carotid bruit is not present. No thyroid mass and no thyromegaly present.  Cardiovascular: Normal rate, regular rhythm, S1 normal, S2 normal, normal heart sounds, intact distal pulses and normal pulses.  Exam reveals no gallop and no friction rub.   No murmur heard. Pulmonary/Chest: Effort normal and breath sounds normal. No tachypnea. No respiratory distress. She has no decreased breath sounds. She has no wheezes. She has no rhonchi. She has no rales.  Abdominal: Soft. Normal appearance and bowel sounds are normal. There is no tenderness.  Neurological: She is alert.  Skin: Skin is warm, dry and intact. No rash noted.  Psychiatric: Her speech is normal. Judgment and thought content normal. Her mood appears not anxious. Her affect is blunt. She is withdrawn. She is not slowed. Cognition and memory are normal. She exhibits a depressed mood.   Very flat affect          Assessment & Plan:

## 2016-05-31 NOTE — Progress Notes (Signed)
Pre visit review using our clinic review tool, if applicable. No additional management support is needed unless otherwise documented below in the visit note. 

## 2016-06-13 ENCOUNTER — Encounter: Payer: Self-pay | Admitting: Emergency Medicine

## 2016-06-13 ENCOUNTER — Emergency Department
Admission: EM | Admit: 2016-06-13 | Discharge: 2016-06-13 | Disposition: A | Discharge status: Home / Self Care | Payer: 59 | Attending: Student in an Organized Health Care Education/Training Program | Admitting: Student in an Organized Health Care Education/Training Program

## 2016-06-13 DIAGNOSIS — F332 Major depressive disorder, recurrent severe without psychotic features: Secondary | ICD-10-CM | POA: Diagnosis not present

## 2016-06-13 DIAGNOSIS — F329 Major depressive disorder, single episode, unspecified: Secondary | ICD-10-CM | POA: Diagnosis not present

## 2016-06-13 DIAGNOSIS — R45851 Suicidal ideations: Secondary | ICD-10-CM | POA: Insufficient documentation

## 2016-06-13 DIAGNOSIS — F32A Depression, unspecified: Secondary | ICD-10-CM

## 2016-06-13 DIAGNOSIS — Z79899 Other long term (current) drug therapy: Secondary | ICD-10-CM | POA: Insufficient documentation

## 2016-06-13 DIAGNOSIS — Z046 Encounter for general psychiatric examination, requested by authority: Secondary | ICD-10-CM

## 2016-06-13 LAB — COMPREHENSIVE METABOLIC PANEL
ALT: 25 U/L (ref 14–54)
AST: 31 U/L (ref 15–41)
Albumin: 3.8 g/dL (ref 3.5–5.0)
Alkaline Phosphatase: 48 U/L (ref 38–126)
Anion gap: 7 (ref 5–15)
BUN: 14 mg/dL (ref 6–20)
CHLORIDE: 108 mmol/L (ref 101–111)
CO2: 24 mmol/L (ref 22–32)
CREATININE: 0.74 mg/dL (ref 0.44–1.00)
Calcium: 8.6 mg/dL — ABNORMAL LOW (ref 8.9–10.3)
GFR calc non Af Amer: >60 mL/min (ref >60)
Glucose, Bld: 94 mg/dL (ref 65–99)
POTASSIUM: 3.5 mmol/L (ref 3.5–5.1)
SODIUM: 139 mmol/L (ref 135–145)
Total Bilirubin: 0.5 mg/dL (ref 0.3–1.2)
Total Protein: 7.2 g/dL (ref 6.5–8.1)

## 2016-06-13 LAB — CBC
HCT: 36.1 % (ref 35.0–47.0)
Hemoglobin: 11.9 g/dL — ABNORMAL LOW (ref 12.0–16.0)
MCH: 25.5 pg — AB (ref 26.0–34.0)
MCHC: 33 g/dL (ref 32.0–36.0)
MCV: 77.2 fL — AB (ref 80.0–100.0)
PLATELETS: 255 10*3/uL (ref 150–440)
RBC: 4.68 MIL/uL (ref 3.80–5.20)
RDW: 16.6 % — AB (ref 11.5–14.5)
WBC: 5.9 10*3/uL (ref 3.6–11.0)

## 2016-06-13 LAB — ETHANOL

## 2016-06-13 LAB — URINE DRUG SCREEN, QUALITATIVE (ARMC ONLY)
Amphetamines, Ur Screen: NOT DETECTED
BARBITURATES, UR SCREEN: NOT DETECTED
BENZODIAZEPINE, UR SCRN: NOT DETECTED
CANNABINOID 50 NG, UR ~~LOC~~: NOT DETECTED
Cocaine Metabolite,Ur ~~LOC~~: NOT DETECTED
MDMA (Ecstasy)Ur Screen: NOT DETECTED
Methadone Scn, Ur: NOT DETECTED
Opiate, Ur Screen: NOT DETECTED
PHENCYCLIDINE (PCP) UR S: NOT DETECTED
TRICYCLIC, UR SCREEN: NOT DETECTED

## 2016-06-13 LAB — ACETAMINOPHEN LEVEL: Acetaminophen (Tylenol), Serum: <10 ug/mL — ABNORMAL LOW (ref 10–30)

## 2016-06-13 LAB — POCT PREGNANCY, URINE: PREG TEST UR: NEGATIVE

## 2016-06-13 LAB — SALICYLATE LEVEL

## 2016-06-13 NOTE — ED Notes (Signed)
Report given to Wendy, RN.

## 2016-06-13 NOTE — ED Notes (Signed)
NAD noted at this time. Pt resting in bed. Will continue to monitor for further patient needs.

## 2016-06-13 NOTE — ED Notes (Signed)
Dr. Weber Cooks talking with Patient, Patient is calm and cooperative.

## 2016-06-13 NOTE — ED Notes (Signed)
Reviewed discharge instructions with patient, including follow-up appointments, suicide prevention information, and medications. Pt verbalized that she has supports she can call upon if needed and she verbalized readiness for discharge. She said she was ready to go home discharge and that her mother is en route.

## 2016-06-13 NOTE — ED Notes (Signed)
Pt arousable with some verbal stimulation. NAD noted. Instructed patient that lunch would be here soon. Pt states understanding. Will continue to monitor at this time.

## 2016-06-13 NOTE — ED Notes (Signed)
Patient was discharged per MD. Belongings were returned.Patient verbalized readiness for discharge and appeared in no acute distress when escorted to lobby.

## 2016-06-13 NOTE — ED Notes (Signed)
Paterica oriented to [facility] and urged to approach staff with needs. Pt denied SI/HI/AVH. Tearful. Asked for phone to speak with mom. Phone given and emotional support offered. Will continue to monitor for needs/safety.

## 2016-06-13 NOTE — ED Notes (Signed)
Pt sat up and given meal tray at this time. Will continue to monitor for further patient needs at this time.

## 2016-06-13 NOTE — ED Notes (Signed)
Per Dr. Quentin Cornwall, no 1:1 sitter due to patient being in a hall bed and easily visualized by Rover and ODS officer.

## 2016-06-13 NOTE — ED Notes (Signed)
Pt resting in bed at this time. Respirations even and unlabored at this time. Will continue to monitor for further patient needs. ODS officer sitting in front of patient at this time.

## 2016-06-13 NOTE — Consult Note (Signed)
[facility] Face-to-Face Psychiatry Consult   Reason for Consult:  Consult 30 year old woman with a history of depression who presented to the emergency room today because of feeling acutely overwhelmed. Referring Physician:  Quentin Cornwall Patient Identification: Paula Evans MRN:  [id number] Principal Diagnosis: Recurrent major depression-severe [facility]) Diagnosis:   Patient Active Problem List   Diagnosis Date Noted  . Involuntary commitment [Z04.6] 06/13/2016  . CIN II (cervical intraepithelial neoplasia II) [N87.1] 05/09/2015  . Lumbar back pain with radiculopathy affecting right lower extremity [M54.17] 11/08/2014  . Constipation [K59.00] 11/08/2014  . Pap smear abnormality of cervix with ASCUS favoring dysplasia [R87.610] 09/08/2014  . Carpal tunnel syndrome, left [G56.02] 07/12/2014  . ABNORMAL GLANDULAR PAPANICOLAOU SMEAR OF CERVIX [R87.619] 08/16/2010  . Moderate depressed bipolar I disorder ([place]) [F31.32] 09/10/2007  . GERD [K21.9] 09/10/2007  . Recurrent major depression-severe ([place]) [F33.2] 06/22/2007    Total Time spent with patient: 1 hour  Subjective:   Paula Evans is a 30 y.o. female patient admitted with "I've just been feeling very depressed".  HPI:  Patient interviewed. Chart reviewed. Labs and vitals reviewed. 30 year old woman presented to the emergency room this morning feeling depressed. 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 She had thoughts about wishing she were dead. She came to the emergency room to be evaluated. Patient says she is been feeling depressed for about 4 months. Her mood is down much of the time. She feels fatigued a great deal of the time. Appetite is about the same. She is not having any psychotic symptoms. She has not been having any suicidal thoughts previously. She has been compliant with prescribed medication from her primary care doctor but has not found it to be helpful yet.  Patient speculates that part of the problem today is that she had been out of work for the past 3 days this was the first day coming back to an increased work load and it made her feel worse than usual. She doesn't report any other specific new stress in her life. Denies abuse of alcohol or drugs. She does have an appointment in place to see a new psychiatrist tomorrow.  Medical history: Patient denied any significant medical history but it looks like from her chart that she's had some gynecologic complaints and some chronic pain issues in the past but nothing requiring current ongoing treatment.  Social history: She lives with her mother. She says that she has lived with her mother her whole life. Nevertheless she says that she does feel like she has some friends or support. 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  Substance abuse history: Says she drinks only occasionally maybe one or 2 beers a week. Never felt like it's been a problem. Denies any other drug abuse  Past Psychiatric History: Has had problems with depression in the past. Never been in a psychiatric hospital. No history of suicide attempts. No history of psychosis. Previously she thinks that the Zoloft and Abilify combination may have been helpful but recently the combination she was prescribed by her primary care doctor was not helping. No history of violence.  Risk to Self: Suicidal Ideation: Yes-Currently Present Suicidal Intent: No Is patient at risk for suicide?: Yes Suicidal Plan?: No Access to Means:  (N/A) What has been your use of drugs/alcohol within the last  12 months?: Pt denies  How many times?: 0 Other Self Harm Risks: 0 Triggers for Past Attempts: Other (Comment) (N/A) Intentional Self Injurious Behavior: None Risk to Others: Homicidal Ideation: No Thoughts of Harm to Others: No Current Homicidal Intent:  No Current Homicidal Plan: No Access to Homicidal Means: No Identified Victim: N/A History of harm to others?: No Assessment of Violence: None Noted Violent Behavior Description: N/A Does patient have access to weapons?: No Criminal Charges Pending?: No Does patient have a court date: No Prior Inpatient Therapy: Prior Inpatient Therapy: No Prior Therapy Dates: N/A Prior Therapy Facilty/Provider(s): N/A Reason for Treatment: N/A Prior Outpatient Therapy: Prior Outpatient Therapy: No Prior Therapy Dates: N/A Prior Therapy Facilty/Provider(s): N/A Reason for Treatment: N/A Does patient have an ACCT team?: No Does patient have Intensive In-House Services?  : No Does patient have [facility] services? : No Does patient have P4CC services?: No  Past Medical History:  Past Medical History:  Diagnosis Date  . Abnormal Pap smear of cervix   . Anxiety   . Dysmenorrhea   . GERD (gastroesophageal reflux disease)   . History of chicken pox     Past Surgical History:  Procedure Laterality Date  . CERVICAL BIOPSY  W/ LOOP ELECTRODE EXCISION    . COLPOSCOPY    . WISDOM TOOTH EXTRACTION  2008   Family History:  Family History  Problem Relation Age of Onset  . Hyperlipidemia Mother   . Alcohol abuse Father   . Lung cancer Paternal Uncle   . Diabetes Sister     prediabetes   Family Psychiatric  History: Father has posttraumatic stress disorder. Social History:  History  Alcohol Use  . 1.2 oz/week  . 2 Standard drinks or equivalent per week     History  Drug Use No    Social History   Social History  . Marital status: Single    Spouse name: N/A  . Number of children: N/A  . Years of education: N/A   Occupational History  . dining services [employer]   Social History Main Topics  . Smoking status: Never Smoker  . Smokeless tobacco: Never Used  . Alcohol use 1.2 oz/week    2 Standard drinks or equivalent per week  . Drug use: No  . Sexual activity: Yes    Partners: Female     Birth control/ protection: None   Other Topics Concern  . None   Social History Narrative   Diet: junk food, water, some fruits and veggies, minimum calcium.   Additional Social History:    Allergies:  No Known Allergies  Labs:  Results for orders placed or performed during the hospital encounter of 06/13/16 (from the past 48 hour(s))  Comprehensive metabolic panel     Status: Abnormal   Collection Time: 06/13/16  7:30 AM  Result Value Ref Range   Sodium 139 135 - 145 mmol/L   Potassium 3.5 3.5 - 5.1 mmol/L   Chloride 108 101 - 111 mmol/L   CO2 24 22 - 32 mmol/L   Glucose, Bld 94 65 - 99 mg/dL   BUN 14 6 - 20 mg/dL   Creatinine, Ser 0.74 0.44 - 1.00 mg/dL   Calcium 8.6 (L) 8.9 - 10.3 mg/dL   Total Protein 7.2 6.5 - 8.1 g/dL   Albumin 3.8 3.5 - 5.0 g/dL   AST 31 15 - 41 U/L   ALT 25 14 - 54 U/L   Alkaline Phosphatase 48 38 - 126 U/L   Total  Bilirubin 0.5 0.3 - 1.2 mg/dL   GFR calc non Af Amer >60 >60 mL/min   GFR calc Af Amer >60 >60 mL/min    Comment: (NOTE) The eGFR has been calculated using the CKD EPI equation. This calculation has not been validated in all clinical situations. eGFR's persistently <60 mL/min signify possible Chronic Kidney Disease.    Anion gap 7 5 - 15  Ethanol     Status: None   Collection Time: 06/13/16  7:30 AM  Result Value Ref Range   Alcohol, Ethyl (B) <5 <5 mg/dL    Comment:        LOWEST DETECTABLE LIMIT FOR SERUM ALCOHOL IS 5 mg/dL FOR MEDICAL PURPOSES ONLY   Salicylate level     Status: None   Collection Time: 06/13/16  7:30 AM  Result Value Ref Range   Salicylate Lvl <2.7 2.8 - 30.0 mg/dL  Acetaminophen level     Status: Abnormal   Collection Time: 06/13/16  7:30 AM  Result Value Ref Range   Acetaminophen (Tylenol), Serum <10 (L) 10 - 30 ug/mL    Comment:        THERAPEUTIC CONCENTRATIONS VARY SIGNIFICANTLY. A RANGE OF 10-30 ug/mL MAY BE AN EFFECTIVE CONCENTRATION FOR MANY PATIENTS. HOWEVER, SOME ARE BEST TREATED AT  CONCENTRATIONS OUTSIDE THIS RANGE. ACETAMINOPHEN CONCENTRATIONS >150 ug/mL AT 4 HOURS AFTER INGESTION AND >50 ug/mL AT 12 HOURS AFTER INGESTION ARE OFTEN ASSOCIATED WITH TOXIC REACTIONS.   cbc     Status: Abnormal   Collection Time: 06/13/16  7:30 AM  Result Value Ref Range   WBC 5.9 3.6 - 11.0 K/uL   RBC 4.68 3.80 - 5.20 MIL/uL   Hemoglobin 11.9 (L) 12.0 - 16.0 g/dL   HCT 36.1 35.0 - 47.0 %   MCV 77.2 (L) 80.0 - 100.0 fL   MCH 25.5 (L) 26.0 - 34.0 pg   MCHC 33.0 32.0 - 36.0 g/dL   RDW 16.6 (H) 11.5 - 14.5 %   Platelets 255 150 - 440 K/uL  Urine Drug Screen, Qualitative     Status: None   Collection Time: 06/13/16  7:30 AM  Result Value Ref Range   Tricyclic, Ur Screen NONE DETECTED NONE DETECTED   Amphetamines, Ur Screen NONE DETECTED NONE DETECTED   MDMA (Ecstasy)Ur Screen NONE DETECTED NONE DETECTED   Cocaine Metabolite,Ur [place] NONE DETECTED NONE DETECTED   Opiate, Ur Screen NONE DETECTED NONE DETECTED   Phencyclidine (PCP) Ur S NONE DETECTED NONE DETECTED   Cannabinoid 50 Ng, Ur [place] NONE DETECTED NONE DETECTED   Barbiturates, Ur Screen NONE DETECTED NONE DETECTED   Benzodiazepine, Ur Scrn NONE DETECTED NONE DETECTED   Methadone Scn, Ur NONE DETECTED NONE DETECTED    Comment: (NOTE) 782  Tricyclics, urine               Cutoff 1000 ng/mL 200  Amphetamines, urine             Cutoff 1000 ng/mL 300  MDMA (Ecstasy), urine           Cutoff 500 ng/mL 400  Cocaine Metabolite, urine       Cutoff 300 ng/mL 500  Opiate, urine                   Cutoff 300 ng/mL 600  Phencyclidine (PCP), urine      Cutoff 25 ng/mL 700  Cannabinoid, urine              Cutoff 50 ng/mL 800  Barbiturates, urine             Cutoff 200 ng/mL 900  Benzodiazepine, urine           Cutoff 200 ng/mL 1000 Methadone, urine                Cutoff 300 ng/mL 1100 1200 The urine drug screen provides only a preliminary, unconfirmed 1300 analytical test result and should not be used for non-medical 1400 purposes.  Clinical consideration and professional judgment should 1500 be applied to any positive drug screen result due to possible 1600 interfering substances. A more specific alternate chemical method 1700 must be used in order to obtain a confirmed analytical result.  1800 Gas chromato graphy / mass spectrometry (GC/MS) is the preferred 1900 confirmatory method.   Pregnancy, urine POC     Status: None   Collection Time: 06/13/16  7:52 AM  Result Value Ref Range   Preg Test, Ur NEGATIVE NEGATIVE    Comment:        THE SENSITIVITY OF THIS METHODOLOGY IS >24 mIU/mL     No current facility-administered medications for this encounter.    Current Outpatient Prescriptions  Medication Sig Dispense Refill  . QUEtiapine (SEROQUEL) 25 MG tablet Take 1 tablet (25 mg total) by mouth at bedtime. 30 tablet 3  . sertraline (ZOLOFT) 50 MG tablet Take 1 tablet (50 mg total) by mouth daily. 30 tablet 3    Musculoskeletal: Strength & Muscle Tone: within normal limits Gait & Station: normal Patient leans: N/A  Psychiatric Specialty Exam: Physical Exam  Nursing note and vitals reviewed. Constitutional: She appears well-developed and well-nourished.  HENT:  Head: Normocephalic and atraumatic.  Eyes: Conjunctivae are normal. Pupils are equal, round, and reactive to light.  Neck: Normal range of motion.  Cardiovascular: Regular rhythm and normal heart sounds.   Respiratory: Effort normal. No respiratory distress.  GI: Soft.  Musculoskeletal: Normal range of motion.  Neurological: She is alert.  Skin: Skin is warm and dry.  Psychiatric: Judgment normal. Her affect is blunt. Her speech is delayed. She is slowed. Cognition and memory are normal. She expresses no suicidal ideation.    Review of Systems  Constitutional: Positive for malaise/fatigue.  HENT: Negative.   Eyes: Negative.   Respiratory: Negative.   Cardiovascular: Negative.   Gastrointestinal: Negative.   Musculoskeletal: Negative.    Skin: Negative.   Neurological: Negative.   Psychiatric/Behavioral: Positive for depression. Negative for hallucinations, memory loss, substance abuse and suicidal ideas. The patient is nervous/anxious. The patient does not have insomnia.     Height _0  (1.651 m), weight 79.4 kg (175 lb), last menstrual period 06/08/2016.Body mass index is 29.12 kg/m.  General Appearance: Casual  Eye Contact:  Fair  Speech:  Slow  Volume:  Decreased  Mood:  Depressed  Affect:  Blunt  Thought Process:  Goal Directed  Orientation:  Full (Time, Place, and Person)  Thought Content:  Logical  Suicidal Thoughts:  No  Homicidal Thoughts:  No  Memory:  Immediate;   Good Recent;   Good Remote;   Fair  Judgement:  Fair  Insight:  Fair  Psychomotor Activity:  Decreased  Concentration:  Concentration: Fair  Recall:  [employer] of Knowledge:  Fair  Language:  Fair  Akathisia:  No  Handed:  Right  AIMS (if indicated):     Assets:  Communication Skills Desire for Improvement Financial Resources/Insurance Housing Physical Health Resilience Social Support  ADL's:  Intact  Cognition:  WNL  Sleep:        Treatment Plan Summary: Plan 29 year old woman with Maj. depression came into the hospital because she felt overwhelmed today. It sounds like it was something of a panicky response. She was having thoughts of wishing she were dead but did not have any plan to act on it and did not do anything to hurt her self. That feeling has largely past and she is not having any current suicidal thoughts. She has good perspective on this. No past history of suicidality. Not psychotic. I don't think she requires inpatient hospital treatment. Patient is stating that she definitely will follow-up with the outpatient appointment seeing her psychiatrist tomorrow. She is to continue on her Zoloft for the moment. She says that her doctor at put her on some Seroquel recently and she thinks that's making her feel worse. I told  her it was safe to discontinue this if she thinks it's making her feel bad. No new prescriptions needed. Discontinue IVC. Case reviewed with emergency room doctor.  Disposition: Patient does not meet criteria for psychiatric inpatient admission. Supportive therapy provided about ongoing stressors.  Alethia Berthold, MD 06/13/2016 3:20 PM

## 2016-06-13 NOTE — ED Triage Notes (Signed)
Patient returned to work from vacation today and expressed to manager that she did not want continue to live.  Patient states "Everything is too hard"  Patient is tearful and avoiding eye contact in triage.

## 2016-06-13 NOTE — ED Provider Notes (Addendum)
[facility] Emergency Department Provider Note    First MD Initiated Contact with Patient 06/13/16 0801     (approximate)  I have reviewed the triage vital signs and the nursing notes.   HISTORY  Chief Complaint Suicidal    HPI Paula Evans is a 30 y.o. female who presents with suicidal ideation. Patient has a history of depression and anxiety and reports that she had previous bouts of suicidal ideations. States that she was recently started on Seroquel and feels that her symptoms have worsened over the past 2 weeks. States that she just got back from the beach on vacation was otherwise feeling well and so she came in to work this morning. States the last 24 hours she has been feeling overwhelmed since of emptiness and hopelessness. States that she no longer wants to live. States that she has a plan to overdose on her Seroquel pills. States that she told her boss at work this morning about her thoughts and she was brought to the ER for further evaluation and management.   Past Medical History:  Diagnosis Date  . Abnormal Pap smear of cervix   . Anxiety   . Dysmenorrhea   . GERD (gastroesophageal reflux disease)   . History of chicken pox     Patient Active Problem List   Diagnosis Date Noted  . CIN II (cervical intraepithelial neoplasia II) 05/09/2015  . Lumbar back pain with radiculopathy affecting right lower extremity 11/08/2014  . Constipation 11/08/2014  . Pap smear abnormality of cervix with ASCUS favoring dysplasia 09/08/2014  . Carpal tunnel syndrome, left 07/12/2014  . ABNORMAL GLANDULAR PAPANICOLAOU SMEAR OF CERVIX 08/16/2010  . Moderate depressed bipolar I disorder ([place]) 09/10/2007  . GERD 09/10/2007  . Recurrent major depression-severe ([place]) 06/22/2007    Past Surgical History:  Procedure Laterality Date  . CERVICAL BIOPSY  W/ LOOP ELECTRODE EXCISION    . COLPOSCOPY    . [place] EXTRACTION  2008    Prior to Admission  medications   Medication Sig Start Date End Date Taking? Authorizing Provider  QUEtiapine (SEROQUEL) 25 MG tablet Take 1 tablet (25 mg total) by mouth at bedtime. 05/31/16  Yes Amy Cletis [place], MD  sertraline (ZOLOFT) 50 MG tablet Take 1 tablet (50 mg total) by mouth daily. 03/05/16  Yes Versie Starks, PA-C    Allergies Review of patient's allergies indicates no known allergies.  Family History  Problem Relation Age of Onset  . Hyperlipidemia Mother   . Alcohol abuse Father   . Lung cancer Paternal Uncle   . Diabetes Sister     prediabetes    Social History Social History  Substance Use Topics  . Smoking status: Never Smoker  . Smokeless tobacco: Never Used  . Alcohol use 1.2 oz/week    2 Standard drinks or equivalent per week    Review of Systems Patient denies headaches, rhinorrhea, blurry vision, numbness, shortness of breath, chest pain, edema, cough, abdominal pain, nausea, vomiting, diarrhea, dysuria, fevers, rashes or hallucinations unless otherwise stated above in HPI. ____________________________________________   PHYSICAL EXAM:  VITAL SIGNS: There were no vitals filed for this visit.  Constitutional: Alert and oriented. Tearful and withdrawan Eyes: Conjunctivae are normal. PERRL. EOMI. Head: Atraumatic. Nose: No congestion/rhinnorhea. Mouth/Throat: Mucous membranes are moist.  Oropharynx non-erythematous. Neck: No stridor. Painless ROM. No cervical spine tenderness to palpation Hematological/Lymphatic/Immunilogical: No cervical lymphadenopathy. Cardiovascular: Normal rate, regular rhythm. Grossly normal heart sounds.  Good peripheral circulation. Respiratory: Normal respiratory  effort.  No retractions. Lungs CTAB. Gastrointestinal: Soft and nontender. No distention. No abdominal bruits. No CVA tenderness. Genitourinary:  Musculoskeletal: No lower extremity tenderness nor edema.  No joint effusions. Neurologic:  Normal speech and language. No gross focal  neurologic deficits are appreciated. No gait instability. Skin:  Skin is warm, dry and intact. No rash noted. Psychiatric: Mood depressed and affect withdrawan. Speech and behavior are normal.  ____________________________________________   LABS (all labs ordered are listed, but only abnormal results are displayed)  Results for orders placed or performed during the hospital encounter of 06/13/16 (from the past 24 hour(s))  Comprehensive metabolic panel     Status: Abnormal   Collection Time: 06/13/16  7:30 AM  Result Value Ref Range   Sodium 139 135 - 145 mmol/L   Potassium 3.5 3.5 - 5.1 mmol/L   Chloride 108 101 - 111 mmol/L   CO2 24 22 - 32 mmol/L   Glucose, Bld 94 65 - 99 mg/dL   BUN 14 6 - 20 mg/dL   Creatinine, Ser 0.74 0.44 - 1.00 mg/dL   Calcium 8.6 (L) 8.9 - 10.3 mg/dL   Total Protein 7.2 6.5 - 8.1 g/dL   Albumin 3.8 3.5 - 5.0 g/dL   AST 31 15 - 41 U/L   ALT 25 14 - 54 U/L   Alkaline Phosphatase 48 38 - 126 U/L   Total Bilirubin 0.5 0.3 - 1.2 mg/dL   GFR calc non Af Amer >60 >60 mL/min   GFR calc Af Amer >60 >60 mL/min   Anion gap 7 5 - 15  Ethanol     Status: None   Collection Time: 06/13/16  7:30 AM  Result Value Ref Range   Alcohol, Ethyl (B) <5 <5 mg/dL  Salicylate level     Status: None   Collection Time: 06/13/16  7:30 AM  Result Value Ref Range   Salicylate Lvl 123456 2.8 - 30.0 mg/dL  Acetaminophen level     Status: Abnormal   Collection Time: 06/13/16  7:30 AM  Result Value Ref Range   Acetaminophen (Tylenol), Serum <10 (L) 10 - 30 ug/mL  cbc     Status: Abnormal   Collection Time: 06/13/16  7:30 AM  Result Value Ref Range   WBC 5.9 3.6 - 11.0 K/uL   RBC 4.68 3.80 - 5.20 MIL/uL   Hemoglobin 11.9 (L) 12.0 - 16.0 g/dL   HCT 36.1 35.0 - 47.0 %   MCV 77.2 (L) 80.0 - 100.0 fL   MCH 25.5 (L) 26.0 - 34.0 pg   MCHC 33.0 32.0 - 36.0 g/dL   RDW 16.6 (H) 11.5 - 14.5 %   Platelets 255 150 - 440 K/uL  Urine Drug Screen, Qualitative     Status: None    Collection Time: 06/13/16  7:30 AM  Result Value Ref Range   Tricyclic, Ur Screen NONE DETECTED NONE DETECTED   Amphetamines, Ur Screen NONE DETECTED NONE DETECTED   MDMA (Ecstasy)Ur Screen NONE DETECTED NONE DETECTED   Cocaine Metabolite,Ur [place] NONE DETECTED NONE DETECTED   Opiate, Ur Screen NONE DETECTED NONE DETECTED   Phencyclidine (PCP) Ur S NONE DETECTED NONE DETECTED   Cannabinoid 50 Ng, Ur [place] NONE DETECTED NONE DETECTED   Barbiturates, Ur Screen NONE DETECTED NONE DETECTED   Benzodiazepine, Ur Scrn NONE DETECTED NONE DETECTED   Methadone Scn, Ur NONE DETECTED NONE DETECTED  Pregnancy, urine POC     Status: None   Collection Time: 06/13/16  7:52 AM  Result  Value Ref Range   Preg Test, Ur NEGATIVE NEGATIVE   ____________________________________________  _______________________________  RADIOLOGY   ____________________________________________   PROCEDURES  Procedure(s) performed: none    Critical Care performed: no ____________________________________________   INITIAL IMPRESSION / ASSESSMENT AND PLAN / ED COURSE  Pertinent labs & imaging results that were available during my care of the patient were reviewed by me and considered in my medical decision making (see chart for details).  DDX: Psychosis, delirium, medication effect, noncompliance, polysubstance abuse, Si, Hi, depression   Paula Evans is a 30 y.o. who presents to the ED with for evaluation of SI.  Patient has psych history of Depression, anxiety, SI.  Laboratory testing was ordered to evaluation for underlying electrolyte derangement or signs of underlying organic pathology to explain today's presentation.  Based on history and physical and laboratory evaluation, it appears that the patient's presentation is 2/2 underlying psychiatric disorder and will require further evaluation and management by inpatient psychiatry.  Patient was  made an IVC due to SI with plan.  Disposition pending psychiatric  evaluation.   Clinical Course  Comment By Time  Patient has been evaluated by Dr. Weber Cooks with psychiatry.  Patient felt to be stable without any active suicidal ideation without a true plan. Likely stress reaction. Patient reiterates that she is not suicidal at this time and is no plan for self-harm. Patient will be discharged for further management with an outpatient psychiatrist.  Have discussed with the patient and available family all diagnostics and treatments performed thus far and all questions were answered to the best of my ability. The patient demonstrates understanding and agreement with plan.  Paula Lot, MD 08/17 1644     ____________________________________________   FINAL CLINICAL IMPRESSION(S) / ED DIAGNOSES  Final diagnoses:  Suicidal ideation  Depression      NEW MEDICATIONS STARTED DURING THIS VISIT:  New Prescriptions   No medications on file     Note:  This document was prepared using Dragon voice recognition software and may include unintentional dictation errors.    Paula Lot, MD 06/13/16 [place], MD 06/13/16 [phone]
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
## 2016-06-14 ENCOUNTER — Ambulatory Visit (INDEPENDENT_AMBULATORY_CARE_PROVIDER_SITE_OTHER): Payer: 59 | Admitting: Licensed Clinical Social Worker

## 2016-06-14 ENCOUNTER — Encounter: Payer: Self-pay | Admitting: Licensed Clinical Social Worker

## 2016-06-14 DIAGNOSIS — F411 Generalized anxiety disorder: Secondary | ICD-10-CM | POA: Diagnosis not present

## 2016-06-14 DIAGNOSIS — F332 Major depressive disorder, recurrent severe without psychotic features: Secondary | ICD-10-CM | POA: Diagnosis not present
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
## 2016-06-28 ENCOUNTER — Ambulatory Visit (INDEPENDENT_AMBULATORY_CARE_PROVIDER_SITE_OTHER): Payer: 59 | Admitting: Licensed Clinical Social Worker

## 2016-06-28 DIAGNOSIS — F332 Major depressive disorder, recurrent severe without psychotic features: Secondary | ICD-10-CM

## 2016-06-28 DIAGNOSIS — F411 Generalized anxiety disorder: Secondary | ICD-10-CM | POA: Diagnosis not present
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
## 2016-07-10 ENCOUNTER — Encounter: Payer: Self-pay | Admitting: Psychiatry

## 2016-07-10 ENCOUNTER — Ambulatory Visit (INDEPENDENT_AMBULATORY_CARE_PROVIDER_SITE_OTHER): Payer: 59 | Admitting: Psychiatry

## 2016-07-10 VITALS — BP 107/69 | HR 67 | Temp 98.9°F | Ht 65.0 in | Wt 181.2 lb

## 2016-07-10 DIAGNOSIS — F3130 Bipolar disorder, current episode depressed, mild or moderate severity, unspecified: Secondary | ICD-10-CM

## 2016-07-10 DIAGNOSIS — F332 Major depressive disorder, recurrent severe without psychotic features: Secondary | ICD-10-CM

## 2016-07-10 MED ORDER — SERTRALINE HCL 50 MG PO TABS: 75.0000 mg | ORAL_TABLET | Freq: Every day | ORAL | 3 refills | Status: DC

## 2016-07-10 NOTE — Progress Notes (Signed)
Psychiatric Initial Adult Assessment   Patient Identification: Paula Evans MRN:  [id number] Date of Evaluation:  07/10/2016 Referral Source: ER  Chief Complaint:   Chief Complaint    Depression; Establish Care; Panic Attack; Stress; Fatigue     Visit Diagnosis:    ICD-9-CM ICD-10-CM   1. Severe episode of recurrent major depressive disorder, without psychotic features ([place]) 296.33 F33.2     History of Present Illness:    Patient is a 30 year old single female who was referred from the emergency room. She was evaluated in the emergency room on August 17 by Dr. Weber Cooks  as she was feeling acutely overwhelmed. Patient reported that she has history of severe depression which started almost 5 months ago after she had relationship issues and was having suicidal ideations at that time. However she currently denied feeling suicidal. She reported that she feels depressed hopeless helpless and has been feeling tired. He reported that she was initially referred to the employee assistance program and she started seeing the therapist over there. She was then sent to the employee health and they started her on the combination of Zoloft and the Abilify. However the Abilify was making her more tired and she stopped taking the medication. She was later started on the Seroquel which made her feel worse. She stopped the medication right away and went to the emergency room. When she was in the emergency room it made her feel worse as she thought that she does not belong to the behavioral health. She started following up with a therapist in our practice and she is feeling better. She reported that she is following with the suggestions of the therapist and is also doing exercise. Her mood is improving. She is planning to go to [place] tomorrow to visit her family members and is looking forward to the change  Pt reported that she feels isolated and lonely and depressed. She having a relationship for the past 6  months but her girlfriend left her without any reasons. Not have any other relationship at this time.  Patient currently denied having any suicidal ideations or plans. She denied having any perceptual disturbances.  Associated Signs/Symptoms: Depression Symptoms:  depressed mood, anhedonia, hypersomnia, psychomotor retardation, fatigue, feelings of worthlessness/guilt, difficulty concentrating, hopelessness, anxiety, loss of energy/fatigue, disturbed sleep, (Hypo) Manic Symptoms:  none Anxiety Symptoms:  anxiety Psychotic Symptoms:  none PTSD Symptoms: Negative NA  Past Psychiatric History:  Employee Health- Zoloft 50mg   Abilify- 5 mg -making her tired.  PCP- gave her Seroquel which made her feel worse She has no history of suicide attempts. She has not ever being admitted to a psychiatric hospital  Previous Psychotropic Medications: Zoloft Abilify seroquel - making her worse  Substance Abuse History in the last 12 months:  Yes.    Occasional beer   Consequences of Substance Abuse: Negative NA  Past Medical History:  Past Medical History:  Diagnosis Date  . Abnormal Pap smear of cervix   . Anxiety   . Dysmenorrhea   . GERD (gastroesophageal reflux disease)   . History of chicken pox     Past Surgical History:  Procedure Laterality Date  . CERVICAL BIOPSY  W/ LOOP ELECTRODE EXCISION    . COLPOSCOPY    . WISDOM TOOTH EXTRACTION  2008    Family Psychiatric History:  Father- PTSD, military   Family History:  Family History  Problem Relation Age of Onset  . Hyperlipidemia Mother   . Alcohol abuse Father   . Post-traumatic stress  disorder Father   . Drug abuse Father   . Lung cancer Paternal Uncle     Social History:   Social History   Social History  . Marital status: Single    Spouse name: N/A  . Number of children: N/A  . Years of education: N/A   Occupational History  . dining services [employer]   Social History Main Topics  . Smoking  status: Former Smoker    Types: Cigarettes  . Smokeless tobacco: Never Used  . Alcohol use 1.8 - 2.4 oz/week    2 Standard drinks or equivalent, 1 - 2 Cans of beer per week  . Drug use: No  . Sexual activity: Not Currently    Partners: Female    Birth control/ protection: None   Other Topics Concern  . None   Social History Narrative   Diet: junk food, water, some fruits and veggies, minimum calcium.    Additional Social History:  Lives with mother. 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 sertraline (ZOLOFT) 50 MG tablet Take 1 tablet (50 mg total) by mouth daily. 30 tablet 3  . QUEtiapine (SEROQUEL) 25 MG tablet Take 1 tablet (25 mg total) by mouth at bedtime. (Patient not taking: Reported on 07/10/2016) 30 tablet 3   No current facility-administered medications for this visit.     Neurologic: Headache: No Seizure: No Paresthesias:No  Musculoskeletal: Strength & Muscle Tone: within normal limits Gait & Station: normal Patient leans: N/A  Psychiatric Specialty Exam: Review of Systems  Constitutional: Positive for malaise/fatigue.  Psychiatric/Behavioral: Positive for depression.  All other systems reviewed and are negative.   Blood pressure 107/69, pulse 67, temperature 98.9 F (37.2 C), temperature source Oral, height 5\' 5"  (1.651 m), weight 181 lb 3.2 oz (82.2 kg), last menstrual period 06/08/2016.Body mass index is 30.15 kg/m.  General Appearance: Casual  Eye Contact:  Fair  Speech:  Clear and Coherent and Slow  Volume:  Decreased  Mood:   Depressed and Dysphoric  Affect:  Congruent  Thought Process:  Coherent  Orientation:  Full (Time, Place, and Person)  Thought Content:  WDL and Logical  Suicidal Thoughts:  No  Homicidal Thoughts:  No  Memory:  Immediate;   Fair Recent;   Fair Remote;   Fair  Judgement:  Fair  Insight:  Fair  Psychomotor Activity:  Normal  Concentration:  Concentration: Fair and Attention Span: Fair  Recall:  [employer] of Knowledge:Fair  Language: Fair  Akathisia:  No  Handed:  Right  AIMS (if indicated):    Assets:  Communication Skills Desire for Improvement Physical Health Social Support  ADL's:  Intact  Cognition: WNL  Sleep:  Sleeps a lot     Treatment Plan Summary: Medication management   Discussed with patient what the medications treatment risks benefits and alternatives. She reported that she is following with the therapy appointments with a regular basis. I will increase the dose of Zoloft 75 mg in the morning. Patient agreed with the plan. Follow-up in one month or earlier depending on  her symptoms   More than 50% of the time spent in psychoeducation, counseling and coordination of care.    This note was generated in part or whole with voice recognition software. Voice regonition is usually quite accurate but there are transcription errors that can and very often do occur. I apologize for any typographical errors that were not detected and corrected.    Rainey Pines, MD 9/13/20173:10 PM

## 2016-07-18 ENCOUNTER — Ambulatory Visit: Payer: 59 | Admitting: Licensed Clinical Social Worker

## 2016-07-19 DIAGNOSIS — H5213 Myopia, bilateral: Secondary | ICD-10-CM | POA: Diagnosis not present

## 2016-07-31 ENCOUNTER — Ambulatory Visit (INDEPENDENT_AMBULATORY_CARE_PROVIDER_SITE_OTHER): Payer: 59 | Admitting: Licensed Clinical Social Worker

## 2016-07-31 DIAGNOSIS — F332 Major depressive disorder, recurrent severe without psychotic features: Secondary | ICD-10-CM

## 2016-07-31 DIAGNOSIS — F411 Generalized anxiety disorder: Secondary | ICD-10-CM

## 2016-07-31 NOTE — Progress Notes (Signed)
   THERAPIST PROGRESS NOTE  Session Time: 3 PM to 3:40 PM  Participation Level: Active  Behavioral Response: CasualAlertEuthymic  Type of Therapy: Individual Therapy  Treatment Goals addressed: She  will work through issues in the past, learn skills to increase self-esteem, learn and implement skills needed to manage emotions and improve mood  Interventions: Strength-based, Supportive, Reframing and Other: Emotional regulation skills  Summary: Paula Evans is a 30 y.o. female who presents with feeling better and feeling better about life. She has been reading Bible plans daily from computer application and the program has applied readings to mental health symptoms such as depression. It is a program that'll to help her develop skills and she gave for example to them she is listing everything she is grateful for every day. She is listening to [employer] who does motivational speeches every day that helps her get started and puts positive thoughts and her head to start the day. She is being more vigilant, being more positive. She is letting go of the negative thoughts and replaces those thoughts with a song. She is spending time with family who will always be there. Cats are therapeutic She has not let things get to her, she feels rejuvenated. Talked about some of the things she has learned from a motivational speaker including to shut down the negative part of you. Explored origin of negative thinking and patient identifies that comes from her parents. Discussed skill development for emotional regulation and reviewed self-monitoring of feelings form. Discussed ways before can be helpful in developing skills to manage emotions. Reviewed how her relationship with her dad is impacted her relationships so patient recognizing this awareness will help her make changes to her unhealthy patterns    Suicidal/Homicidal: No  Therapist Response: Reviewed symptoms and explored reasons that patient is feeling  better. She has been implementing a regular program to help her progress that includes spirituality as it will to help her with and starting the day with a motivational speaker. Explored with her origin of negative thoughts and identified internalizing voice of her parents. Pointed out this awareness will help her challenge these thoughts. Provided positive feedback for effective coping skills patient is using such as letting go of the negative and fresh attitude of not letting things get to her. Provided positive feedback for other strategies including spending time,with family, having her pets as supports enlisting things she is grateful for. Pointed out to patient that her awareness of how her relationship with her father has impacted her current relationships will help in changing unhealthy patterns for her.    Plan: Return again in 3 weeks.2. Patient continue with her program and attitude shift this helped her to make progress.3. Patient complete self-monitoring of feelings form and review handout on treatment overview for narrative storytelling for skill development.  Diagnosis: Axis I: major depressive disorder, recurrent, severe, generalized anxiety disorder    Axis II: No diagnosis    Maureena Dabbs A, LCSW 07/31/2016

## 2016-08-06 ENCOUNTER — Ambulatory Visit: Payer: Self-pay | Admitting: Psychiatry

## 2016-08-21 ENCOUNTER — Ambulatory Visit (INDEPENDENT_AMBULATORY_CARE_PROVIDER_SITE_OTHER): Payer: 59 | Admitting: Licensed Clinical Social Worker

## 2016-08-21 DIAGNOSIS — F332 Major depressive disorder, recurrent severe without psychotic features: Secondary | ICD-10-CM

## 2016-08-21 DIAGNOSIS — F411 Generalized anxiety disorder: Secondary | ICD-10-CM | POA: Diagnosis not present

## 2016-08-21 NOTE — Progress Notes (Signed)
   THERAPIST PROGRESS NOTE  Session Time: 3 PM to 3:40 PM  Participation Level: Active  Behavioral Response: CasualDrowsyEuthymic  Type of Therapy: Individual Therapy  Treatment Goals addressed:  She  will work through issues in the past, learn skills to increase self-esteem, learn and implement skills needed to manage emotions and improve mood  Interventions: CBT, Solution Focused, Strength-based and Supportive  Summary: Paula Evans is a 30 y.o. female who presents with still implementing coping skills that are effective. She rates her mood as 8 or 9 out of 10. She relates that she does not want to become dependent on her medications and agrees that she needs more ability before thinking about discontinuing. Dates that she's been on her meds for 6 months. She relates that she has been filling out self-monitoring of feelings form of though she forgot to bring it today. Reviewed reasoning identifying and labeling emotions with therapist. Explained to patient for example is in her feelings and when, where, and with him make her, can lead to greater clarity about the patient's tires and wishes for her work life and relationships, and it can lead to the pursuit of personal goals that are intrinsically pleasurable or satisfying to the patient Reviewed negative self talk worksheet normal help patient challenge automatic thoughts to evidence that does not support the thought and develop an alternative thought that is more realistic and positive. Reviewed effectiveness of patient's doing gratitude daily and how spirituality is providing good foundation for good coping. Patient related that her job gets on her nerves and then she doesn't get support from her supervisors.  she is looking for work on her days off   Suicidal/Homicidal: No  Therapist Response: Therapist reviewed progress and symptoms. Identified that coping strategy patient has been developing are still effective and she also actively  reading and learning on her own to find strategies that work. Discussed rationale for self-monitoring and understanding feelings that include hoping is guided her decisions and behaviors contributed to adaptive living, build confidence in the accuracy of emotional experiences so they become resources and simplifying daily life, and awareness of feelings help the patient make decisions to live with greater satisfaction, both about herself and about her relationship with others. Introduced negative self talk worksheet so patient could challenge negative thoughts and develop alternative and more realistic in positive self talk. Reviewed how patient's gratitude list can be helpful including increasing well-being, improving her relationships, make is more optimistic, and even helping [place] to find meaning in her work. Help patient process feelings about her work. Reviewed healthy decision making around medication discontinuation and provided strength based and supportive interventions.  Plan: Return again in 3 weeks.2. Patient fill out worksheet entitled "negative thought stopping worksheet".3. Patient continue to learn and implement coping skills that have helped improve mood and functioning  Diagnosis: Axis I:  major depressive disorder, recurrent, severe, generalized anxiety disorder    Axis II: No diagnosis    Paula Evans A, LCSW 08/21/2016

## 2016-09-11 ENCOUNTER — Ambulatory Visit: Payer: 59 | Admitting: Licensed Clinical Social Worker

## 2016-09-12 ENCOUNTER — Encounter: Payer: 59 | Admitting: Obstetrics and Gynecology

## 2016-10-02 ENCOUNTER — Ambulatory Visit: Payer: 59 | Admitting: Licensed Clinical Social Worker

## 2016-10-16 ENCOUNTER — Encounter: Payer: 59 | Admitting: Obstetrics and Gynecology

## 2016-10-31 ENCOUNTER — Telehealth: Payer: 59 | Admitting: Family

## 2016-10-31 DIAGNOSIS — N760 Acute vaginitis: Secondary | ICD-10-CM | POA: Diagnosis not present

## 2016-10-31 DIAGNOSIS — B9689 Other specified bacterial agents as the cause of diseases classified elsewhere: Secondary | ICD-10-CM | POA: Diagnosis not present

## 2016-10-31 MED ORDER — METRONIDAZOLE 500 MG PO TABS: 500.0000 mg | ORAL_TABLET | Freq: Two times a day (BID) | ORAL | 0 refills | Status: DC

## 2016-10-31 MED ORDER — FLUCONAZOLE 150 MG PO TABS: 150.0000 mg | ORAL_TABLET | Freq: Once | ORAL | 0 refills | Status: AC

## 2016-10-31 NOTE — Progress Notes (Signed)
We are sorry that you are not feeling well. Here is how we plan to help! Based on what you shared with me it looks like you: May have a vaginosis due to bacteria  Vaginosis is an inflammation of the vagina that can result in discharge, itching and pain. The cause is usually a change in the normal balance of vaginal bacteria or an infection. Vaginosis can also result from reduced estrogen levels after menopause.  The most common causes of vaginosis are:   Bacterial vaginosis which results from an overgrowth of one on several organisms that are normally present in your vagina.   Yeast infections which are caused by a naturally occurring fungus called candida.   Vaginal atrophy (atrophic vaginosis) which results from the thinning of the vagina from reduced estrogen levels after menopause.   Trichomoniasis which is caused by a parasite and is commonly transmitted by sexual intercourse.  Factors that increase your risk of developing vaginosis include: Marland Kitchen Medications, such as antibiotics and steroids . Uncontrolled diabetes . Use of hygiene products such as bubble bath, vaginal spray or vaginal deodorant . Douching . Wearing damp or tight-fitting clothing . Using an intrauterine device (IUD) for birth control . Hormonal changes, such as those associated with pregnancy, birth control pills or menopause . Sexual activity . Having a sexually transmitted infection  Your treatment plan is Metronidazole or Flagyl 500mg  twice a day for 7 days.  I have electronically sent this prescription into the pharmacy that you have chosen.   I have also ordered Diflucan 150mg  x 1 dose (in case you develop a yeast infection from the antibiotics in a few days).   Be sure to take all of the medication as directed. Stop taking any medication if you develop a rash, tongue swelling or shortness of breath. Mothers who are breast feeding should consider pumping and discarding their breast milk while on these  antibiotics. However, there is no consensus that infant exposure at these doses would be harmful.  Remember that medication creams can weaken latex condoms. Marland Kitchen   HOME CARE:  Good hygiene may prevent some types of vaginosis from recurring and may relieve some symptoms:  . Avoid baths, hot tubs and whirlpool spas. Rinse soap from your outer genital area after a shower, and dry the area well to prevent irritation. Don't use scented or harsh soaps, such as those with deodorant or antibacterial action. Marland Kitchen Avoid irritants. These include scented tampons and pads. . Wipe from front to back after using the toilet. Doing so avoids spreading fecal bacteria to your vagina.  Other things that may help prevent vaginosis include:  Marland Kitchen Don't douche. Your vagina doesn't require cleansing other than normal bathing. Repetitive douching disrupts the normal organisms that reside in the vagina and can actually increase your risk of vaginal infection. Douching won't clear up a vaginal infection. . Use a latex condom. Both female and female latex condoms may help you avoid infections spread by sexual contact. . Wear cotton underwear. Also wear pantyhose with a cotton crotch. If you feel comfortable without it, skip wearing underwear to bed. Yeast thrives in [employer] Your symptoms should improve in the next day or two.  GET HELP RIGHT AWAY IF:  . You have pain in your lower abdomen ( pelvic area or over your ovaries) . You develop nausea or vomiting . You develop a fever . Your discharge changes or worsens . You have persistent pain with intercourse . You develop shortness of breath, a  rapid pulse, or you faint.  These symptoms could be signs of problems or infections that need to be evaluated by a medical provider now.  MAKE SURE YOU    Understand these instructions.  Will watch your condition.  Will get help right away if you are not doing well or get worse.  Your e-visit answers were reviewed  by a board certified advanced clinical practitioner to complete your personal care plan. Depending upon the condition, your plan could have included both over the counter or prescription medications. Please review your pharmacy choice to make sure that you have choses a pharmacy that is open for you to pick up any needed prescription, Your safety is important to [place]. If you have drug allergies check your prescription carefully.   You can use MyChart to ask questions about today's visit, request a non-urgent call back, or ask for a work or school excuse for 24 hours related to this e-Visit. If it has been greater than 24 hours you will need to follow up with your provider, or enter a new e-Visit to address those concerns. You will get a MyChart message within the next two days asking about your experience. I hope that your e-visit has been valuable and will speed your recovery.

## 2016-11-28 ENCOUNTER — Other Ambulatory Visit (INDEPENDENT_AMBULATORY_CARE_PROVIDER_SITE_OTHER): Payer: 59

## 2016-11-28 ENCOUNTER — Encounter: Payer: Self-pay | Admitting: Obstetrics and Gynecology

## 2016-11-28 ENCOUNTER — Other Ambulatory Visit: Payer: Self-pay | Admitting: Obstetrics and Gynecology

## 2016-11-28 ENCOUNTER — Ambulatory Visit (INDEPENDENT_AMBULATORY_CARE_PROVIDER_SITE_OTHER): Payer: 59 | Admitting: Obstetrics and Gynecology

## 2016-11-28 VITALS — BP 96/65 | HR 80 | Ht 65.5 in | Wt 196.6 lb

## 2016-11-28 DIAGNOSIS — D259 Leiomyoma of uterus, unspecified: Secondary | ICD-10-CM | POA: Diagnosis not present

## 2016-11-28 DIAGNOSIS — Z124 Encounter for screening for malignant neoplasm of cervix: Secondary | ICD-10-CM | POA: Diagnosis not present

## 2016-11-28 DIAGNOSIS — R1900 Intra-abdominal and pelvic swelling, mass and lump, unspecified site: Secondary | ICD-10-CM

## 2016-11-28 DIAGNOSIS — Z01419 Encounter for gynecological examination (general) (routine) without abnormal findings: Secondary | ICD-10-CM | POA: Diagnosis not present

## 2016-11-28 NOTE — Progress Notes (Signed)
GYNECOLOGY CLINIC ANNUAL EXAM PROGRESS NOTE  Subjective:    Paula Evans is a 31 y.o. P0 female who presents for an annual exam. The patient has no complaints today. The patient is sexually active (female partners only). 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 The patient wears seatbelts: yes. The patient participates in regular exercise: yes. Has the patient ever been transfused or tattooed?: yes. The patient reports that there is not domestic violence in her life.    Menstrual History: OB History    Gravida Para Term Preterm AB Living   0 0 0 0 0 0   SAB TAB Ectopic Multiple Live Births   0 0 0 0        Menarche age: 46  Patient's last menstrual period was 11/13/2016.  Notes normal regular menses Period Cycle (Days): 28 Period Duration (Days): 5-7 Period Pattern: Regular Menstrual Flow: Moderate Dysmenorrhea: (!) Mild Dysmenorrhea Symptoms: Cramping   Denies h/o of STIs. Does note h/o abnormal pap smears Contraception: None  Past Medical History:  Diagnosis Date  . Abnormal Pap smear of cervix   . Anxiety   . Dysmenorrhea   . GERD (gastroesophageal reflux disease)   . History of chicken pox   . Major depression     Family History  Problem Relation Age of Onset  . Hyperlipidemia Mother   . Alcohol abuse Father   . Post-traumatic stress disorder Father   . Drug abuse Father   . Lung cancer Paternal Uncle     Past Surgical History  Procedure Laterality Date  . Wisdom tooth extraction  2008  . Cervical biopsy  w/ loop electrode excision  11/2014  . Colposcopy  10/2014    Social History   Social History  . Marital status: Single    Spouse name: N/A  . Number of children: N/A  . Years of education: N/A   Occupational History  . dining services [employer]   Social History Main Topics  . Smoking status: Former Smoker    Types:  Cigarettes  . Smokeless tobacco: Never Used  . Alcohol use 1.8 - 2.4 oz/week    2 Standard drinks or equivalent, 1 - 2 Cans of beer per week  . Drug use: No  . Sexual activity: Not Currently    Partners: Female    Birth control/ protection: None   Other Topics Concern  . Not on file   Social History Narrative   Diet: junk food, water, some fruits and veggies, minimum calcium.     Outpatient Encounter Prescriptions as of 11/28/2016  Medication Sig  . [DISCONTINUED] metroNIDAZOLE (FLAGYL) 500 MG tablet Take 1 tablet (500 mg total) by mouth 2 (two) times daily.  . [DISCONTINUED] sertraline (ZOLOFT) 50 MG tablet Take 1.5 tablets (75 mg total) by mouth daily.   No facility-administered encounter medications on file as of 11/28/2016.      No Known Allergies  Review of Systems A comprehensive review of systems was negative.    Objective:    BP 96/65 (BP Location: Left Arm, Patient Position: Sitting, Cuff Size: Large)   Pulse 80   Ht 5' 5.5" (1.664 m)   Wt 196 lb 9.6 oz (89.2 kg)   LMP 11/13/2016   BMI 32.22 kg/m .    General Appearance:    Alert, cooperative, no distress, appears stated age, mild obesity  Head:    Normocephalic, without obvious abnormality, atraumatic  Eyes:    PERRL, conjunctiva/corneas clear, EOM's intact, both eyes  Ears:    Normal external ear canals, both ears  Nose:   Nares normal, septum midline, mucosa normal, no drainage or sinus tenderness  Throat:   Lips, mucosa, and tongue normal; teeth and gums normal  Neck:   Supple, symmetrical, trachea midline, no adenopathy; thyroid: no enlargement/tenderness/nodules; no carotid bruit or JVD  Back:     Symmetric, no curvature, ROM normal, no CVA tenderness  Lungs:     Clear to auscultation bilaterally, respirations unlabored  Chest Wall:    No tenderness or deformity   Heart:    Regular rate and rhythm, S1 and S2 normal, no murmur, rub or gallop  Breast Exam:    No tenderness, masses, or nipple abnormality    Abdomen:     Soft, non-tender, bowel sounds active all four quadrants, no organomegaly.  Palpable mass felt midway between umbilicus and pubic symphisis  Genitalia:    Pelvic:external genitalia normal, vagina without lesions, discharge, or tenderness, rectovaginal septum  normal. Cervix unable to be visualized, displaced anteriorly.  Uterus with large palpable mass bulging into vaginal region, smooth, ` 8 cm.  Unsure if adnexal mass of uterine mass.  Adnexae not palpable.    Rectal:    Normal external sphincter.  No hemorrhoids appreciated. Internal exam not done.   Extremities:   Extremities normal, atraumatic, no cyanosis or edema  Pulses:   2+ and symmetric all extremities  Skin:   Skin color, texture, turgor normal, no rashes or lesions  Lymph nodes:   Cervical, supraclavicular, and axillary nodes normal  Neurologic:   CNII-XII intact, normal strength, sensation and reflexes throughout     Labs:  Lab Results  Component Value Date   WBC 5.9 06/13/2016   HGB 11.9 (L) 06/13/2016   HCT 36.1 06/13/2016   MCV 77.2 (L) 06/13/2016   PLT 255 06/13/2016    Lab Results  Component Value Date   CREATININE 0.74 06/13/2016   BUN 14 06/13/2016   NA 139 06/13/2016   K 3.5 06/13/2016   CL 108 06/13/2016   CO2 24 06/13/2016    Lab Results  Component Value Date   ALT 25 06/13/2016   AST 31 06/13/2016   ALKPHOS 48 06/13/2016   BILITOT 0.5 06/13/2016    Lab Results  Component Value Date   CHOL 177 11/02/2014   HDL 44.70 11/02/2014   LDLCALC 108 (H) 11/02/2014   TRIG 121.0 11/02/2014   CHOLHDL 4 11/02/2014    No results found for: TSH     Imaging:  ULTRASOUND REPORT  Location: [facility] Date of Service: 11/28/16   Indications:Enlarged Uterus Findings:  The uterus measures 12 x 8.7 x 9.2 cm. Echo texture is heterogenous with evidence of focal masses. Within the uterus is a suspected fibroid measuring: Fibroid 1: 9.6 x 8.8 x 9.2 cm  The Endometrium and  right ovary are obscured by large fibroid.  Right Ovary is not seen. Left Ovary measures 4.6 x 2.5 x 2.4 cm. It is normal appearance. Survey of the adnexa demonstrates no adnexal masses. There is no free fluid in the cul de sac.  Impression: 1. Fibroid Uterus  Recommendations: 1.Clinical correlation with the patient's History and Physical Exam.   Assessment:    Healthy female exam.   Large fibroid uterus Obesity (Class I)  Plan:     Blood tests:  None. Patient has  had labs within past year. Breast self exam technique reviewed and patient encouraged to perform self-exam monthly. Contraception: none.  In same sex relationship. Counseled on safe sex practices. Discussed healthy lifestyle modifications. Large pelvic mass noted on exam today, ultrasound performed noted large fibroid.  Discussed that as it is not causing patient symptoms, no intervention is required.  If patient ever desires conception (which she now notes that she does), further discussion can be had on management with myomectomy, depending on the location of the fibroid (intramural vs submucosal), or patient may require her partner to be the carrier. Recommend f/u ultrasound in 6 months due to reassess fibroid growth (as rapid growth occurred over the past year). Pap smear performed today (blind pap as cervix not visible, likely displaced by large fibroid).  Follow up in 1 year, or sooner if needed.    Rubie Maid, MD [facility]

## 2016-12-01 ENCOUNTER — Encounter: Payer: Self-pay | Admitting: Obstetrics and Gynecology

## 2016-12-04 LAB — PAP IG AND HPV HIGH-RISK
HPV, HIGH-RISK: NEGATIVE
PAP SMEAR COMMENT: 0

## 2016-12-10 DIAGNOSIS — D259 Leiomyoma of uterus, unspecified: Secondary | ICD-10-CM | POA: Diagnosis not present

## 2016-12-31 DIAGNOSIS — D259 Leiomyoma of uterus, unspecified: Secondary | ICD-10-CM | POA: Diagnosis not present

## 2017-01-08 ENCOUNTER — Ambulatory Visit (INDEPENDENT_AMBULATORY_CARE_PROVIDER_SITE_OTHER): Payer: 59 | Admitting: Psychiatry

## 2017-01-08 ENCOUNTER — Encounter: Payer: Self-pay | Admitting: Psychiatry

## 2017-01-08 VITALS — BP 121/77 | HR 78 | Temp 97.7°F | Wt 201.2 lb

## 2017-01-08 DIAGNOSIS — F39 Unspecified mood [affective] disorder: Secondary | ICD-10-CM | POA: Diagnosis not present

## 2017-01-08 NOTE — Progress Notes (Signed)
Psychiatric MD Progress Note   Patient Identification: Paula Evans MRN:  [id number] Date of Evaluation:  01/08/2017 Referral Source: ER  Chief Complaint:   Chief Complaint    Follow-up; Medication Refill     Visit Diagnosis:    ICD-9-CM ICD-10-CM   1. Episodic mood disorder ([place]) 296.90 F39     History of Present Illness:    Patient is a 31 -year-old single female who was seen for initial evaluation in September. Patient came back for the follow-up appointment. She reported that she has stopped taking the medications as she is doing well and is spending time with her family. 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  She brought a form and reported that it needs  to be filled out.  Patient reported that she has also dropped her therapy appointments with Stanton Kidney and is not following her on a regular basis. Patient currently denied having any mood symptoms. She reported that she is trying to spend time with her family members.   She currently denied having any suicidal homicidal ideations or plans. She reported that she sleeps well at home.   She is not willing to take any medications at this time. She reported that she is planning to relocate to [facility] if she will find a new job.    Associated Signs/Symptoms: Depression Symptoms:  hypersomnia, fatigue, anxiety, disturbed sleep, (Hypo) Manic Symptoms:  none Anxiety Symptoms:  anxiety Psychotic Symptoms:  none PTSD Symptoms: Negative NA  Past Psychiatric History:  Employee Health- Zoloft 50mg   Abilify- 5 mg -making her tired.  PCP- gave her Seroquel which made her feel worse She has no history of suicide attempts. She has not ever being admitted to a psychiatric hospital  Previous Psychotropic Medications: Zoloft Abilify seroquel - making her worse  Substance Abuse History in the last 12 months:  Yes.    Occasional beer   Consequences of Substance  Abuse: Negative NA  Past Medical History:  Past Medical History:  Diagnosis Date  . Abnormal Pap smear of cervix   . Anxiety   . Dysmenorrhea   . GERD (gastroesophageal reflux disease)   . History of chicken pox   . Major depression     Past Surgical History:  Procedure Laterality Date  . CERVICAL BIOPSY  W/ LOOP ELECTRODE EXCISION    . COLPOSCOPY    . WISDOM TOOTH EXTRACTION  2008    Family Psychiatric History:  Father- PTSD, military   Family History:  Family History  Problem Relation Age of Onset  . Hyperlipidemia Mother   . Alcohol abuse Father   . Post-traumatic stress disorder Father   . Drug abuse Father   . Lung cancer Paternal Uncle     Social History:   Social History   Social History  . Marital status: Single    Spouse name: N/A  . Number of children: N/A  . Years of education: N/A   Occupational History  . dining services [employer]   Social History Main Topics  . Smoking status: Former Smoker    Types: Cigarettes  . Smokeless tobacco: Never Used  . Alcohol use 1.8 - 2.4 oz/week    2 Standard drinks or equivalent, 1 - 2 Cans of beer per week  . Drug use: No  . Sexual activity: Not Currently    Partners: Female    Birth control/ protection: None   Other Topics Concern  . None   Social  History Narrative   Diet: junk food, water, some fruits and veggies, minimum calcium.    Additional Social History:  Lives with mother. 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   No current facility-administered medications for this visit.      Neurologic: Headache: No Seizure: No Paresthesias:No  Musculoskeletal: Strength & Muscle Tone: within normal limits Gait & Station: normal Patient leans: N/A  Psychiatric Specialty Exam: Review of Systems  Constitutional: Positive for malaise/fatigue.  Psychiatric/Behavioral: Positive for depression.  All other systems reviewed and are negative.   Blood pressure 121/77, pulse 78, temperature 97.7 F (36.5 C), temperature source Oral, weight 201 lb 3.2 oz (91.3 kg).Body mass index is 32.97 kg/m.  General Appearance: Casual  Eye Contact:  Fair  Speech:  Clear and Coherent and Slow  Volume:  Normal  Mood:  Anxious  Affect:  Congruent  Thought Process:  Coherent  Orientation:  Full (Time, Place, and Person)  Thought Content:  WDL and Logical  Suicidal Thoughts:  No  Homicidal Thoughts:  No  Memory:  Immediate;   Fair Recent;   Fair Remote;   Fair  Judgement:  Fair  Insight:  Fair  Psychomotor Activity:  Normal  Concentration:  Concentration: Fair and Attention Span: Fair  Recall:  [employer] of Knowledge:Fair  Language: Fair  Akathisia:  No  Handed:  Right  AIMS (if indicated):    Assets:  Communication Skills Desire for Improvement Physical Health Social Support  ADL's:  Intact  Cognition: WNL  Sleep:  Sleeps a lot     Treatment Plan Summary: Medication management   Discussed with patient what the medications treatment risks benefits and alternatives. She reported that she will make an appointment with Stanton Kidney- therapist to have the forms filled out for her new job She does not want to take any new medications at this time. She reported that she is feeling stable.   More than 50% of the time spent in psychoeducation, counseling and coordination of care.      Rainey Pines, MD 3/14/20182:48 PM

## 2017-01-16 ENCOUNTER — Ambulatory Visit (INDEPENDENT_AMBULATORY_CARE_PROVIDER_SITE_OTHER): Payer: 59 | Admitting: Licensed Clinical Social Worker

## 2017-01-16 DIAGNOSIS — F411 Generalized anxiety disorder: Secondary | ICD-10-CM

## 2017-01-16 DIAGNOSIS — F332 Major depressive disorder, recurrent severe without psychotic features: Secondary | ICD-10-CM | POA: Diagnosis not present

## 2017-01-16 NOTE — Progress Notes (Signed)
   THERAPIST PROGRESS NOTE  Session Time: 3:05 PM to 3:50 PM  Participation Level: Active  Behavioral Response: CasualAlertEuthymic  Type of Therapy: Individual Therapy  Treatment Goals addressed:  She  will work through issues in the past, learn skills to increase self-esteem, learn and implement skills needed to manage emotions and improve mood  Interventions: Solution Focused, Strength-based and Supportive  Summary: Paula Evans is a 31 y.o. 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 Therapist explained that she could not give current information but could only fill out form based on information from before when she was seen more consistently from 06/14/16 to 08/21/16. Diagnosis provided on form by therapist was from when last seen on  on 08/21/16. Information in Section related to "Mental Health Evaluation"  is based on patient report provided at this session. Per information given by patient, she shows good progress and significant decrease in mental health symptoms. Therapist pointed out that patient at last session on October had shown good progress and per her report at this session decrease in mental health symptoms. Therapist explained that she  could not fill out section on Programmer, multimedia job overview" as she was not a Engineer, drilling.   Suicidal/Homicidal: No  Therapist Response: Therapist assisted in filling out "mental health evaluation" form for transportation Animal nutritionist. Therapist provided information based on when patient was last seen regularly on 08/21/16. Utilized patient report of information at this session to fill out part of the section titled "Mental health evaluation". Therapist explained that she could not fill out section for "Scientist, product/process development Job Overview" because she was not a physician. Therapist noted that she had made good progress at last session  in 08/21/16 and also shows significant decrease in symptoms based on her current self-report  Plan: Patient is not planning to continue with sessions and reports decrease in symptoms.   Diagnosis: Axis I:  major depressive disorder, recurrent, severe, generalized anxiety disorder    Axis II: No diagnosis    Mizraim Harmening A, LCSW 01/16/2017

## 2017-01-27 ENCOUNTER — Encounter: Payer: Self-pay | Admitting: Family Medicine

## 2017-01-27 ENCOUNTER — Ambulatory Visit (INDEPENDENT_AMBULATORY_CARE_PROVIDER_SITE_OTHER): Payer: 59 | Admitting: Family Medicine

## 2017-01-27 VITALS — BP 100/72 | HR 83 | Temp 98.9°F | Ht 65.5 in | Wt 193.0 lb

## 2017-01-27 DIAGNOSIS — R21 Rash and other nonspecific skin eruption: Secondary | ICD-10-CM | POA: Diagnosis not present

## 2017-01-27 DIAGNOSIS — Z7721 Contact with and (suspected) exposure to potentially hazardous body fluids: Secondary | ICD-10-CM

## 2017-01-27 LAB — CBC WITH DIFFERENTIAL/PLATELET
BASOS PCT: 0.6 % (ref 0.0–3.0)
Basophils Absolute: 0 10*3/uL (ref 0.0–0.1)
EOS PCT: 1.5 % (ref 0.0–5.0)
Eosinophils Absolute: 0.1 10*3/uL (ref 0.0–0.7)
HEMATOCRIT: 42.7 % (ref 36.0–46.0)
Hemoglobin: 14.5 g/dL (ref 12.0–15.0)
LYMPHS PCT: 22.3 % (ref 12.0–46.0)
Lymphs Abs: 1.6 10*3/uL (ref 0.7–4.0)
MCHC: 33.9 g/dL (ref 30.0–36.0)
MCV: 86 fl (ref 78.0–100.0)
MONOS PCT: 9.6 % (ref 3.0–12.0)
Monocytes Absolute: 0.7 10*3/uL (ref 0.1–1.0)
NEUTROS ABS: 4.7 10*3/uL (ref 1.4–7.7)
Neutrophils Relative %: 66 % (ref 43.0–77.0)
PLATELETS: 253 10*3/uL (ref 150.0–400.0)
RBC: 4.96 Mil/uL (ref 3.87–5.11)
RDW: 13.7 % (ref 11.5–15.5)
WBC: 7.2 10*3/uL (ref 4.0–10.5)

## 2017-01-27 MED ORDER — TRIAMCINOLONE ACETONIDE 0.1 % EX CREA: 1.0000 "application " | TOPICAL_CREAM | Freq: Two times a day (BID) | CUTANEOUS | 0 refills | Status: DC

## 2017-01-27 NOTE — Progress Notes (Signed)
Dr. Karleen Hampshire T. Tyshon Fanning, MD, CAQ Sports [facility] [address] [place], [address] Phone: [phone] Fax: [phone]  01/27/2017  Patient: Paula Evans, MRN: [id number], DOB: 18-Mar-1986, 31 y.o.  Primary Physician:  Kerby Nora, MD   Chief Complaint  Patient presents with  . Rash   Subjective:   Paula Evans is a 31 y.o. very pleasant female patient who presents with the following:  Pleasant young woman who presents with intermittent rash, flat small reddish in appearance and mildly pruritic. She has not done anything to that are tried anything to make it feel better. She has no known exposures that she can think of and no new oral medications, foods, or systemic exposures.  She also brings up that she has some concerns about possible STDs, and her partner recently had a very low white blood cell count.  Past Medical History, Surgical History, Social History, Family History, Problem List, Medications, and Allergies have been reviewed and updated if relevant.  Patient Active Problem List   Diagnosis Date Noted  . Generalized anxiety disorder 06/14/2016  . Involuntary commitment 06/13/2016  . CIN II (cervical intraepithelial neoplasia II) 05/09/2015  . Lumbar back pain with radiculopathy affecting right lower extremity 11/08/2014  . Constipation 11/08/2014  . Pap smear abnormality of cervix with ASCUS favoring dysplasia 09/08/2014  . Carpal tunnel syndrome, left 07/12/2014  . ABNORMAL GLANDULAR PAPANICOLAOU SMEAR OF CERVIX 08/16/2010  . Moderate depressed bipolar I disorder (HCC) 09/10/2007  . GERD 09/10/2007  . Severe episode of recurrent major depressive disorder, without psychotic features ([facility]CC) 06/22/2007    Past Medical History:  Diagnosis Date  . Abnormal Pap smear of cervix   . Anxiety   . Dysmenorrhea   . GERD (gastroesophageal reflux disease)   . History of chicken pox   . Major depression     Past Surgical History:    Procedure Laterality Date  . CERVICAL BIOPSY  W/ LOOP ELECTRODE EXCISION    . COLPOSCOPY    . WISDOM TOOTH EXTRACTION  2008    Social History   Social History  . Marital status: Single    Spouse name: N/A  . Number of children: N/A  . Years of education: N/A   Occupational History  . dining services [employer]   Social History Main Topics  . Smoking status: Former Smoker    Types: Cigarettes  . Smokeless tobacco: Never Used  . Alcohol use 1.8 - 2.4 oz/week    2 Standard drinks or equivalent, 1 - 2 Cans of beer per week  . Drug use: No  . Sexual activity: Not Currently    Partners: Female    Birth control/ protection: None   Other Topics Concern  . Not on file   Social History Narrative   Diet: junk food, water, some fruits and veggies, minimum calcium.    Family History  Problem Relation Age of Onset  . Hyperlipidemia Mother   . Alcohol abuse Father   . Post-traumatic stress disorder Father   . Drug abuse Father   . Lung cancer Paternal Uncle     No Known Allergies  Medication list reviewed and updated in full in [facility].   GEN: No acute illnesses, no fevers, chills. GI: No n/v/d, eating normally Pulm: No SOB Interactive and getting along well at home.  Otherwise, ROS is as per the HPI.  Objective:   BP 100/72   Pulse 83   Temp 98.9  F (37.2 C) (Oral)   Ht 5' 5.5" (1.664 m)   Wt 193 lb (87.5 kg)   LMP 01/10/2017   BMI 31.63 kg/m   GEN: WDWN, NAD, Non-toxic, A & O x 3 HEENT: Atraumatic, Normocephalic. Neck supple. No masses, No LAD. Ears and Nose: No external deformity. EXTR: No c/c/e NEURO Normal gait.  PSYCH: Normally interactive. Conversant. Not depressed or anxious appearing.  Calm demeanor.   Scattered reddish in appearance with some excoriation throughout that the chest arms and back rare in occurrence no significant elevation, mild scale.  Laboratory and Imaging Data:  Assessment and Plan:   Rash and nonspecific skin  eruption  History of exposure to hazardous bodily fluids - Plan: HIV antibody, RPR, GC/Chlamydia Probe Amp, CBC with Differential/Platelet, Hepatitis C antibody, Trichomonas vaginalis, RNA  At the patient's request, check basic STD workup.  I'm not overly concerned regarding the patient's rash, gave her some topical corticosteroid to hopefully help.  Follow-up: No Follow-up on file.  Meds ordered this encounter  Medications  . triamcinolone cream (KENALOG) 0.1 %    Sig: Apply 1 application topically 2 (two) times daily.    Dispense:  454 g    Refill:  0   There are no discontinued medications. Orders Placed This Encounter  Procedures  . GC/Chlamydia Probe Amp  . Trichomonas vaginalis, RNA  . HIV antibody  . RPR  . CBC with Differential/Platelet  . Hepatitis C antibody    Signed,  Montford Barg T. Taline Nass, MD   Allergies as of 01/27/2017   No Known Allergies     Medication List       Accurate as of 01/27/17  1:38 PM. Always use your most recent med list.          triamcinolone cream 0.1 % Commonly known as:  KENALOG Apply 1 application topically 2 (two) times daily.

## 2017-01-27 NOTE — Progress Notes (Signed)
Pre visit review using our clinic review tool, if applicable. No additional management support is needed unless otherwise documented below in the visit note. 

## 2017-01-28 LAB — HIV ANTIBODY (ROUTINE TESTING W REFLEX): HIV: NONREACTIVE

## 2017-01-28 LAB — HEPATITIS C ANTIBODY: HCV AB: NEGATIVE

## 2017-01-28 LAB — RPR

## 2017-01-29 LAB — TRICHOMONAS VAGINALIS, PROBE AMP: TRICH VAG BY NAA: NEGATIVE

## 2017-02-02 ENCOUNTER — Telehealth: Payer: Self-pay | Admitting: Family Medicine

## 2017-02-02 DIAGNOSIS — Z7721 Contact with and (suspected) exposure to potentially hazardous body fluids: Secondary | ICD-10-CM

## 2017-02-02 NOTE — Telephone Encounter (Signed)
GC/CMZ has not resulting from 01/27/2017.  Please call lab and investigate for me. Thanks.

## 2017-02-05 NOTE — Telephone Encounter (Addendum)
I called [facility] and they have no record of test in their system. Per Epic it doesn't look like test was released to [facility] to be performed, as it's still listed as "Active", and not "Active- Collected".  It appears the test didn't cross over the the draw que, and thereforenot able to be released for [facility] to perform. It occasionally happens in EPIC. The specimen will have to be recollected.

## 2017-02-06 NOTE — Telephone Encounter (Signed)
Attempted call. No answer.

## 2017-02-07 NOTE — Telephone Encounter (Signed)
Patient returned Dr.Copland's call.  Patient can be reached at [phone].

## 2017-02-11 NOTE — Addendum Note (Signed)
Addended by: Carter Kitten on: 02/11/2017 10:26 AM   Modules accepted: Orders

## 2017-02-11 NOTE — Telephone Encounter (Signed)
[facility] notified as instructed by telephone. Lab appointment scheduled for 02/14/2017 at 9:45 to collect Urine GC/CHL. Future orders entered in EPIC.

## 2017-02-11 NOTE — Telephone Encounter (Signed)
Paula Evans, I can't seem to get through to the patient. I think her phone might be blocking the call when I call with *[phone]. Called again.  I have been trying to reach her and just saw her most recent message this morning.   Please call:  One of her tests for gonorrhea and chlamydia was either not done or lost in the lab. This type of thing happens almost never, but human or computer error is always possible.   We can always get another urine sample any time she would like - lab visit only.

## 2017-02-14 ENCOUNTER — Other Ambulatory Visit: Payer: 59

## 2017-02-14 DIAGNOSIS — Z7721 Contact with and (suspected) exposure to potentially hazardous body fluids: Secondary | ICD-10-CM | POA: Diagnosis not present

## 2017-02-18 LAB — GC/CHLAMYDIA PROBE AMP
CT PROBE, AMP APTIMA: NOT DETECTED
GC PROBE AMP APTIMA: NOT DETECTED

## 2017-02-19 ENCOUNTER — Other Ambulatory Visit: Payer: Self-pay | Admitting: Obstetrics and Gynecology

## 2017-03-13 NOTE — H&P (Signed)
Paula Evans is a 31 y.o. female G: 0 who presents for a myomectomy because of symptomatic uterine fibroids.  During a routine gynecologic exam in February 2018 the patient was first  advised that she had large uterine fibroids.  She reports that her menstrual flow lasts 7 days with pad change every 1.5  hours. A week after her periods,  the patient will spot for 1-2 days.  Menstrual flow is accompanied by cramping that is rated 9/10 on a 10 point pain scale but decreases to 4/10 with Aleve.   Additionally she has deep pelvic pressure, dyspareunia, urinary frequency, nocturia (3 times at night), lower back pain and constipation.  She denies any incontinence, urinary hesitancy or flank pain.  A pelvic ultrasound in March 2018 showed: anteverted uterus: 17.97 x 9.67 x 11.76 cm, limited view of endometrium due to fibroids: 0.76 cm,  #3 fibroids: right posterior intramural-9.12 cm;  lateral fundus, sub-serosal and pedunculated fibroids: 6.33 cm and 5.37 cm; left ovary was not visualized and right ovary: 4.30 cm.  A review of both medical and surgical management options were given to the patient however,  she wants to proceed with surgical management in the form of  a robot assisted myomectomy.   Past Medical History  OB History: G: 0  GYN History: menarche: 31 YO   LMP  02/10/2017     Contracepton no method  (The patient is Bisexual)  The patient reports a past history of: HPV.  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  ROS: Admits to urinary frequency, nocturia, pelvic pressure, lower back pain, constipation and heartburn but  denies headache, vision changes, nasal congestion, dysphagia, tinnitus, dizziness, hoarseness, cough,  chest pain, shortness of breath, nausea, vomiting, diarrhea,  urgency,   dysuria, hematuria, vaginitis symptoms, swelling of joints,easy bruising,  arthralgias, skin rashes, unexplained weight loss and except as is mentioned in the history of present illness, patient's review of systems is otherwise negative.   Physical Exam  Bp: 110/64  P: 68 bpm  R: 16  Weight:  196 lbs.  Height: 5\' 5"   BMI: 32.6  Neck: supple without masses or thyromegaly Lungs: clear to auscultation Heart: regular rate and rhythm Abdomen: firm mass from pelvis to 2 fingers below the umbilicus with tenderness but no guarding or rebound. Pelvic:EGBUS- wnl; vagina-normal rugae; uterus- tender 20 weeks size filling the posterior cul- de-sac, cervix appears normal but limited view due to  displacement by fibroids; adnexae-obscured by mass effect of uterus but no tenderness or separable masses appreciated Extremities:  no clubbing, cyanosis or edema   Assesment:  Symptomatic Uterine Fibroids                        Pelvic Pain                        Dysmenorrhea   Disposition:   A discussion was held with the  patient regarding the indication for her procedures:  the robotic approach has less postoperative pain, less blood loss during surgery, reduced risk of injury to other organs due to better visualization with a 3-D/HD 10 times magnifying camera, shorter hospital stay between 0-1 night and rapid recovery with return to daily routine in 2-3 weeks. Although robot-assisted procedures have a longer operative time than traditional laparotomy, in a patient with good medical history, the benefits usually outweigh the risks.  Risks include  reaction to anesthesia, excessive bleeding, infection, injury to other organs, need for an open abdominal incision, need for C-section if the endometrial cavity is breached and transient post-operative facial edema.   Patient informed about FDA warning on use of morcellator dated 02/11/2013.   Discussed: 1. Incidence of post-operative diagnosis of sarcoma in women undergoing a hysterectomy is 2:1000  2. Annual incidence of leiomyosarcoma is 0.64/100,000 women  3. Sarcomas have the highest incidence in women over 65  4. Power morcellation involves risks of spreading tissue / disease. In he case of undiagnosed cancer, it may adversely affect the patient's prognosis.  The patient was given a Miralax Bowel Prep to be completed on the day prior to her surgery. She verbalized understanding of her risks and pre-operative instructions and has consented to proceed with Robot Assisted Laparoscopic Myomectomy with Fibroid Morcellation and Possible Abdominal Myomectomy  at [place] on June 6, , 2018 at 7:30 a. m.     CSN# [id number]   Quadarius Henton J. Florene Glen, PA-C  for Dr.Sandra A. Rivard

## 2017-03-26 NOTE — Patient Instructions (Signed)
Your procedure is scheduled on:  Wednesday, April 02, 2017  Enter through the [employer] [facility] at:  6:00 AM  Pick up the phone at the desk and dial [phone].  Call this number if you have problems the morning of surgery: [phone].  Remember: Do NOT eat food or drink after:  Midnight Tuesday  Take these medicines the morning of surgery with a SIP OF WATER:  None  Stop ALL herbal medications at this time  Do NOT smoke the day of surgery.  Do NOT wear jewelry (body piercing), metal hair clips/bobby pins, make-up, or nail polish. Do NOT wear lotions, powders, or perfumes.  You may wear deodorant. Do NOT shave for 48 hours prior to surgery. Do NOT bring valuables to the hospital. Contacts, dentures, or bridgework may not be worn into surgery.  Leave suitcase in car.  After surgery it may be brought to your room.  For patients admitted to the hospital, checkout time is 11:00 AM the day of discharge.  Bring a copy of your healthcare power of attorney and living will documents.

## 2017-03-27 ENCOUNTER — Encounter (HOSPITAL_COMMUNITY): Payer: Self-pay

## 2017-03-27 ENCOUNTER — Encounter (HOSPITAL_COMMUNITY)
Admission: RE | Admit: 2017-03-27 | Discharge: 2017-03-27 | Disposition: A | Discharge status: Home / Self Care | Payer: 59 | Source: Ambulatory Visit | Attending: Obstetrics and Gynecology | Admitting: Obstetrics and Gynecology

## 2017-03-27 DIAGNOSIS — M5417 Radiculopathy, lumbosacral region: Secondary | ICD-10-CM | POA: Diagnosis not present

## 2017-03-27 DIAGNOSIS — N946 Dysmenorrhea, unspecified: Secondary | ICD-10-CM | POA: Insufficient documentation

## 2017-03-27 DIAGNOSIS — Z01812 Encounter for preprocedural laboratory examination: Secondary | ICD-10-CM | POA: Insufficient documentation

## 2017-03-27 DIAGNOSIS — F411 Generalized anxiety disorder: Secondary | ICD-10-CM | POA: Insufficient documentation

## 2017-03-27 DIAGNOSIS — F3132 Bipolar disorder, current episode depressed, moderate: Secondary | ICD-10-CM | POA: Diagnosis not present

## 2017-03-27 DIAGNOSIS — N871 Moderate cervical dysplasia: Secondary | ICD-10-CM | POA: Diagnosis not present

## 2017-03-27 DIAGNOSIS — K219 Gastro-esophageal reflux disease without esophagitis: Secondary | ICD-10-CM | POA: Insufficient documentation

## 2017-03-27 HISTORY — DX: Nausea with vomiting, unspecified: R11.2

## 2017-03-27 HISTORY — DX: Nausea with vomiting, unspecified: Z98.890

## 2017-03-27 LAB — BASIC METABOLIC PANEL
Anion gap: 6 (ref 5–15)
BUN: 6 mg/dL (ref 6–20)
CALCIUM: 9.5 mg/dL (ref 8.9–10.3)
CO2: 29 mmol/L (ref 22–32)
CREATININE: 0.74 mg/dL (ref 0.44–1.00)
Chloride: 103 mmol/L (ref 101–111)
GFR calc Af Amer: >60 mL/min (ref >60)
GFR calc non Af Amer: >60 mL/min (ref >60)
GLUCOSE: 92 mg/dL (ref 65–99)
Potassium: 4.1 mmol/L (ref 3.5–5.1)
Sodium: 138 mmol/L (ref 135–145)

## 2017-03-27 LAB — CBC
HCT: 41.3 % (ref 36.0–46.0)
Hemoglobin: 14.3 g/dL (ref 12.0–15.0)
MCH: 29.7 pg (ref 26.0–34.0)
MCHC: 34.6 g/dL (ref 30.0–36.0)
MCV: 85.9 fL (ref 78.0–100.0)
PLATELETS: 268 10*3/uL (ref 150–400)
RBC: 4.81 MIL/uL (ref 3.87–5.11)
RDW: 13.1 % (ref 11.5–15.5)
WBC: 7.6 10*3/uL (ref 4.0–10.5)

## 2017-03-27 LAB — TYPE AND SCREEN
ABO/RH(D): O POS
Antibody Screen: NEGATIVE

## 2017-03-27 LAB — ABO/RH: ABO/RH(D): O POS

## 2017-04-02 ENCOUNTER — Encounter (HOSPITAL_COMMUNITY): Payer: Self-pay | Admitting: *Deleted

## 2017-04-02 ENCOUNTER — Ambulatory Visit (HOSPITAL_COMMUNITY): Payer: 59 | Admitting: Anesthesiology

## 2017-04-02 ENCOUNTER — Encounter (HOSPITAL_COMMUNITY)
Admission: RE | Disposition: A | Discharge status: Home / Self Care | Payer: Self-pay | Source: Ambulatory Visit | Attending: Obstetrics and Gynecology

## 2017-04-02 ENCOUNTER — Ambulatory Visit (HOSPITAL_COMMUNITY)
Admission: RE | Admit: 2017-04-02 | Discharge: 2017-04-02 | Disposition: A | Discharge status: Home / Self Care | Payer: 59 | Source: Ambulatory Visit | Attending: Obstetrics and Gynecology | Admitting: Obstetrics and Gynecology

## 2017-04-02 DIAGNOSIS — Z6831 Body mass index (BMI) 31.0-31.9, adult: Secondary | ICD-10-CM | POA: Insufficient documentation

## 2017-04-02 DIAGNOSIS — R102 Pelvic and perineal pain: Secondary | ICD-10-CM | POA: Insufficient documentation

## 2017-04-02 DIAGNOSIS — D259 Leiomyoma of uterus, unspecified: Secondary | ICD-10-CM | POA: Diagnosis not present

## 2017-04-02 DIAGNOSIS — N946 Dysmenorrhea, unspecified: Secondary | ICD-10-CM | POA: Diagnosis not present

## 2017-04-02 DIAGNOSIS — F17200 Nicotine dependence, unspecified, uncomplicated: Secondary | ICD-10-CM | POA: Insufficient documentation

## 2017-04-02 DIAGNOSIS — D251 Intramural leiomyoma of uterus: Secondary | ICD-10-CM | POA: Diagnosis not present

## 2017-04-02 DIAGNOSIS — F418 Other specified anxiety disorders: Secondary | ICD-10-CM | POA: Diagnosis not present

## 2017-04-02 HISTORY — PX: ROBOT ASSISTED MYOMECTOMY: SHX5142

## 2017-04-02 LAB — PREGNANCY, URINE: Preg Test, Ur: NEGATIVE

## 2017-04-02 SURGERY — ROBOTIC ASSISTED MYOMECTOMY
Anesthesia: General | Site: Abdomen

## 2017-04-02 MED ORDER — LIDOCAINE HCL (CARDIAC) 20 MG/ML IV SOLN
INTRAVENOUS | Status: DC | PRN
  Administered 2017-04-02: 50 mg via INTRAVENOUS

## 2017-04-02 MED ORDER — HYDROMORPHONE HCL 1 MG/ML IJ SOLN
INTRAMUSCULAR | Status: AC
  Filled 2017-04-02: qty 1

## 2017-04-02 MED ORDER — STERILE WATER FOR IRRIGATION IR SOLN
Status: DC | PRN
  Administered 2017-04-02: 1000 mL

## 2017-04-02 MED ORDER — PROPOFOL 10 MG/ML IV BOLUS
INTRAVENOUS | Status: AC
  Filled 2017-04-02: qty 20

## 2017-04-02 MED ORDER — MIDAZOLAM HCL 2 MG/2ML IJ SOLN
INTRAMUSCULAR | Status: AC
  Filled 2017-04-02: qty 2

## 2017-04-02 MED ORDER — MENTHOL 3 MG MT LOZG: 1.0000 | LOZENGE | OROMUCOSAL | Status: DC | PRN

## 2017-04-02 MED ORDER — DEXAMETHASONE SODIUM PHOSPHATE 10 MG/ML IJ SOLN
INTRAMUSCULAR | Status: DC | PRN
  Administered 2017-04-02: 4 mg via INTRAVENOUS

## 2017-04-02 MED ORDER — ROCURONIUM BROMIDE 100 MG/10ML IV SOLN
INTRAVENOUS | Status: AC
  Filled 2017-04-02: qty 1

## 2017-04-02 MED ORDER — HYDROMORPHONE HCL 1 MG/ML IJ SOLN
INTRAMUSCULAR | Status: DC | PRN
  Administered 2017-04-02: 1 mg via INTRAVENOUS

## 2017-04-02 MED ORDER — ROPIVACAINE HCL 5 MG/ML IJ SOLN
INTRAMUSCULAR | Status: AC
  Filled 2017-04-02: qty 60

## 2017-04-02 MED ORDER — CEFOTETAN DISODIUM-DEXTROSE 2-2.08 GM-% IV SOLR
INTRAVENOUS | Status: AC
  Filled 2017-04-02: qty 50

## 2017-04-02 MED ORDER — OXYCODONE-ACETAMINOPHEN 5-325 MG PO TABS: ORAL_TABLET | ORAL | 0 refills | Status: DC

## 2017-04-02 MED ORDER — SUGAMMADEX SODIUM 200 MG/2ML IV SOLN
INTRAVENOUS | Status: DC | PRN
  Administered 2017-04-02: 200 mg via INTRAVENOUS

## 2017-04-02 MED ORDER — FENTANYL CITRATE (PF) 250 MCG/5ML IJ SOLN
INTRAMUSCULAR | Status: AC
  Filled 2017-04-02: qty 5

## 2017-04-02 MED ORDER — GLYCOPYRROLATE 0.2 MG/ML IJ SOLN
INTRAMUSCULAR | Status: DC | PRN
  Administered 2017-04-02: 0.2 mg via INTRAVENOUS

## 2017-04-02 MED ORDER — KETOROLAC TROMETHAMINE 30 MG/ML IJ SOLN
INTRAMUSCULAR | Status: DC | PRN
  Administered 2017-04-02: 30 mg via INTRAVENOUS
  Administered 2017-04-02: 30 mg via INTRAMUSCULAR

## 2017-04-02 MED ORDER — LIDOCAINE HCL (CARDIAC) 20 MG/ML IV SOLN
INTRAVENOUS | Status: AC
  Filled 2017-04-02: qty 5

## 2017-04-02 MED ORDER — IBUPROFEN 600 MG PO TABS: 600.0000 mg | ORAL_TABLET | Freq: Four times a day (QID) | ORAL | 0 refills | Status: DC | PRN

## 2017-04-02 MED ORDER — KETOROLAC TROMETHAMINE 30 MG/ML IJ SOLN
30.0000 mg | Freq: Four times a day (QID) | INTRAMUSCULAR | Status: DC
  Administered 2017-04-02: 30 mg via INTRAVENOUS
  Filled 2017-04-02: qty 1

## 2017-04-02 MED ORDER — ONDANSETRON HCL 4 MG PO TABS: 4.0000 mg | ORAL_TABLET | Freq: Three times a day (TID) | ORAL | Status: DC | PRN

## 2017-04-02 MED ORDER — IBUPROFEN 600 MG PO TABS: ORAL_TABLET | ORAL | 1 refills | Status: DC

## 2017-04-02 MED ORDER — SCOPOLAMINE 1 MG/3DAYS TD PT72
MEDICATED_PATCH | TRANSDERMAL | Status: AC
  Filled 2017-04-02: qty 1

## 2017-04-02 MED ORDER — GLYCOPYRROLATE 0.2 MG/ML IJ SOLN
INTRAMUSCULAR | Status: AC
  Filled 2017-04-02: qty 1

## 2017-04-02 MED ORDER — LACTATED RINGERS IR SOLN
Status: DC | PRN
  Administered 2017-04-02: 3000 mL

## 2017-04-02 MED ORDER — LACTATED RINGERS IV SOLN
INTRAVENOUS | Status: DC
  Administered 2017-04-02 (×3): via INTRAVENOUS

## 2017-04-02 MED ORDER — ONDANSETRON HCL 4 MG/2ML IJ SOLN
INTRAMUSCULAR | Status: DC | PRN
  Administered 2017-04-02: 4 mg via INTRAVENOUS

## 2017-04-02 MED ORDER — DEXAMETHASONE SODIUM PHOSPHATE 4 MG/ML IJ SOLN
INTRAMUSCULAR | Status: AC
  Filled 2017-04-02: qty 1

## 2017-04-02 MED ORDER — FENTANYL CITRATE (PF) 100 MCG/2ML IJ SOLN
INTRAMUSCULAR | Status: DC | PRN
  Administered 2017-04-02: 100 ug via INTRAVENOUS
  Administered 2017-04-02: 50 ug via INTRAVENOUS
  Administered 2017-04-02 (×2): 100 ug via INTRAVENOUS
  Administered 2017-04-02: 50 ug via INTRAVENOUS
  Administered 2017-04-02: 100 ug via INTRAVENOUS

## 2017-04-02 MED ORDER — VASOPRESSIN 20 UNIT/ML IV SOLN
INTRAVENOUS | Status: AC
  Filled 2017-04-02: qty 1

## 2017-04-02 MED ORDER — CEFOTETAN DISODIUM-DEXTROSE 2-2.08 GM-% IV SOLR
2.0000 g | INTRAVENOUS | Status: AC
  Administered 2017-04-02: 2 g via INTRAVENOUS

## 2017-04-02 MED ORDER — LACTATED RINGERS IV SOLN
INTRAVENOUS | Status: DC
  Administered 2017-04-02: 17:00:00 via INTRAVENOUS

## 2017-04-02 MED ORDER — VASOPRESSIN 20 UNIT/ML IV SOLN
INTRAVENOUS | Status: AC
  Filled 2017-04-02: qty 3

## 2017-04-02 MED ORDER — MIDAZOLAM HCL 2 MG/2ML IJ SOLN: 0.5000 mg | Freq: Once | INTRAMUSCULAR | Status: DC | PRN

## 2017-04-02 MED ORDER — MIDAZOLAM HCL 2 MG/2ML IJ SOLN
INTRAMUSCULAR | Status: DC | PRN
  Administered 2017-04-02: 2 mg via INTRAVENOUS

## 2017-04-02 MED ORDER — OXYCODONE-ACETAMINOPHEN 5-325 MG PO TABS: 1.0000 | ORAL_TABLET | ORAL | 0 refills | Status: DC | PRN

## 2017-04-02 MED ORDER — ONDANSETRON HCL 4 MG/2ML IJ SOLN
INTRAMUSCULAR | Status: AC
  Filled 2017-04-02: qty 2

## 2017-04-02 MED ORDER — IBUPROFEN 600 MG PO TABS: 600.0000 mg | ORAL_TABLET | Freq: Four times a day (QID) | ORAL | Status: DC | PRN

## 2017-04-02 MED ORDER — SUGAMMADEX SODIUM 200 MG/2ML IV SOLN
INTRAVENOUS | Status: AC
  Filled 2017-04-02: qty 2

## 2017-04-02 MED ORDER — MEPERIDINE HCL 25 MG/ML IJ SOLN: 6.2500 mg | INTRAMUSCULAR | Status: DC | PRN

## 2017-04-02 MED ORDER — VASOPRESSIN 20 UNIT/ML IV SOLN
INTRAVENOUS | Status: DC | PRN
  Administered 2017-04-02: 107 mL via INTRAMUSCULAR

## 2017-04-02 MED ORDER — PROMETHAZINE HCL 25 MG/ML IJ SOLN: 6.2500 mg | INTRAMUSCULAR | Status: DC | PRN

## 2017-04-02 MED ORDER — PROPOFOL 10 MG/ML IV BOLUS
INTRAVENOUS | Status: DC | PRN
  Administered 2017-04-02: 150 mg via INTRAVENOUS

## 2017-04-02 MED ORDER — OXYCODONE-ACETAMINOPHEN 5-325 MG PO TABS
1.0000 | ORAL_TABLET | ORAL | Status: DC | PRN
  Administered 2017-04-02 (×2): 1 via ORAL
  Filled 2017-04-02 (×2): qty 1

## 2017-04-02 MED ORDER — SCOPOLAMINE 1 MG/3DAYS TD PT72
1.0000 | MEDICATED_PATCH | Freq: Once | TRANSDERMAL | Status: DC
  Administered 2017-04-02: 1.5 mg via TRANSDERMAL

## 2017-04-02 MED ORDER — ROCURONIUM BROMIDE 100 MG/10ML IV SOLN
INTRAVENOUS | Status: DC | PRN
  Administered 2017-04-02: 20 mg via INTRAVENOUS
  Administered 2017-04-02 (×2): 10 mg via INTRAVENOUS
  Administered 2017-04-02: 50 mg via INTRAVENOUS
  Administered 2017-04-02 (×2): 10 mg via INTRAVENOUS

## 2017-04-02 MED ORDER — ONDANSETRON HCL 4 MG PO TABS: 4.0000 mg | ORAL_TABLET | Freq: Three times a day (TID) | ORAL | 0 refills | Status: DC | PRN

## 2017-04-02 MED ORDER — HYDROMORPHONE HCL 1 MG/ML IJ SOLN
0.2500 mg | INTRAMUSCULAR | Status: DC | PRN
  Administered 2017-04-02: 0.5 mg via INTRAVENOUS

## 2017-04-02 MED ORDER — SODIUM CHLORIDE 0.9 % IV SOLN
INTRAVENOUS | Status: DC | PRN
  Administered 2017-04-02: 110 mL

## 2017-04-02 SURGICAL SUPPLY — 73 items
ADH SKN CLS APL DERMABOND .7 (GAUZE/BANDAGES/DRESSINGS) ×1
BARRIER ADHS 3X4 INTERCEED (GAUZE/BANDAGES/DRESSINGS) ×3 IMPLANT
BLADE LAP MORCELLATOR 15X9.5 (ELECTROSURGICAL) ×1 IMPLANT
BLADE MORCELLATOR EXT  12.5X15 (ELECTROSURGICAL)
BLADE MORCELLATOR EXT 12.5X15 (ELECTROSURGICAL) IMPLANT
BRR ADH 4X3 ABS CNTRL BYND (GAUZE/BANDAGES/DRESSINGS) ×2
CLOTH BEACON ORANGE TIMEOUT ST (SAFETY) ×2 IMPLANT
CONT PATH 16OZ SNAP LID 3702 (MISCELLANEOUS) ×2 IMPLANT
COVER BACK TABLE 60X90IN (DRAPES) ×4 IMPLANT
COVER TIP SHEARS 8 DVNC (MISCELLANEOUS) ×1 IMPLANT
COVER TIP SHEARS 8MM DA VINCI (MISCELLANEOUS) ×1
DECANTER SPIKE VIAL GLASS SM (MISCELLANEOUS) ×4 IMPLANT
DEFOGGER SCOPE WARMER CLEARIFY (MISCELLANEOUS) ×2 IMPLANT
DERMABOND ADVANCED (GAUZE/BANDAGES/DRESSINGS) ×1
DERMABOND ADVANCED .7 DNX12 (GAUZE/BANDAGES/DRESSINGS) ×1 IMPLANT
DILATOR CANAL MILEX (MISCELLANEOUS) ×1 IMPLANT
DRSG OPSITE POSTOP 3X4 (GAUZE/BANDAGES/DRESSINGS) ×2 IMPLANT
DURAPREP 26ML APPLICATOR (WOUND CARE) ×2 IMPLANT
ELECT REM PT RETURN 9FT ADLT (ELECTROSURGICAL) ×2
ELECTRODE REM PT RTRN 9FT ADLT (ELECTROSURGICAL) ×1 IMPLANT
GAUZE VASELINE 3X9 (GAUZE/BANDAGES/DRESSINGS) IMPLANT
GLOVE BIOGEL PI IND STRL 7.0 (GLOVE) ×5 IMPLANT
GLOVE BIOGEL PI INDICATOR 7.0 (GLOVE) ×5
GLOVE ECLIPSE 6.5 STRL STRAW (GLOVE) ×6 IMPLANT
KIT ABG SYR 3ML LUER SLIP (SYRINGE) ×2 IMPLANT
KIT ACCESSORY DA VINCI DISP (KITS) ×1
KIT ACCESSORY DVNC DISP (KITS) ×1 IMPLANT
LEGGING LITHOTOMY PAIR STRL (DRAPES) ×2 IMPLANT
MANIPULATOR UTERINE 4.5 ZUMI (MISCELLANEOUS) IMPLANT
NDL SPNL 22GX7 QUINCKE BK (NEEDLE) ×1 IMPLANT
NEEDLE SPNL 22GX7 QUINCKE BK (NEEDLE) ×2 IMPLANT
NS IRRIG 1000ML POUR BTL (IV SOLUTION) ×6 IMPLANT
PACK ROBOT WH (CUSTOM PROCEDURE TRAY) ×2 IMPLANT
PACK ROBOTIC GOWN (GOWN DISPOSABLE) ×2 IMPLANT
PACK TRENDGUARD 450 HYBRID PRO (MISCELLANEOUS) IMPLANT
PACK TRENDGUARD 600 HYBRD PROC (MISCELLANEOUS) IMPLANT
PAD PREP 24X48 CUFFED NSTRL (MISCELLANEOUS) ×2 IMPLANT
PORT ACCESS TROCAR AIRSEAL 12 (TROCAR) ×1 IMPLANT
PORT ACCESS TROCAR AIRSEAL 5M (TROCAR) ×1
POUCH INSTRUMENT 3X5 (STERILIZATION PRODUCTS) ×1 IMPLANT
PROTECTOR NERVE ULNAR (MISCELLANEOUS) ×6 IMPLANT
SET CYSTO W/LG BORE CLAMP LF (SET/KITS/TRAYS/PACK) IMPLANT
SET IRRIG TUBING LAPAROSCOPIC (IRRIGATION / IRRIGATOR) ×2 IMPLANT
SET TRI-LUMEN FLTR TB AIRSEAL (TUBING) IMPLANT
SUT DVC VLOC 180 0 12IN GS21 (SUTURE) ×6
SUT DVC VLOC 180 2-0 12IN GS21 (SUTURE) ×4
SUT MNCRL AB 3-0 PS2 27 (SUTURE) ×4 IMPLANT
SUT VIC AB 0 CT1 36 (SUTURE) ×1 IMPLANT
SUT VIC AB 0 CT2 27 (SUTURE) IMPLANT
SUT VIC AB 2-0 SH 27 (SUTURE) ×2
SUT VIC AB 2-0 SH 27XBRD (SUTURE) IMPLANT
SUT VICRYL 0 UR6 27IN ABS (SUTURE) ×4 IMPLANT
SUT VLOC 180 0 9IN  GS21 (SUTURE)
SUT VLOC 180 0 9IN GS21 (SUTURE) IMPLANT
SUT VLOC 180 2-0 6IN GS21 (SUTURE) IMPLANT
SUTURE DVC VL 180 2-0 12INGS21 (SUTURE) IMPLANT
SUTURE DVC VLC 180 0 12IN GS21 (SUTURE) IMPLANT
SYR 50ML LL SCALE MARK (SYRINGE) ×2 IMPLANT
SYR BULB IRRIGATION 50ML (SYRINGE) ×2 IMPLANT
SYRINGE 10CC LL (SYRINGE) ×4 IMPLANT
SYSTEM CONVERTIBLE TROCAR (TROCAR) ×2 IMPLANT
TIP UTERINE 5.1X6CM LAV DISP (MISCELLANEOUS) IMPLANT
TIP UTERINE 6.7X10CM GRN DISP (MISCELLANEOUS) ×1 IMPLANT
TIP UTERINE 6.7X6CM WHT DISP (MISCELLANEOUS) IMPLANT
TIP UTERINE 6.7X8CM BLUE DISP (MISCELLANEOUS) IMPLANT
TOWEL OR 17X24 6PK STRL BLUE (TOWEL DISPOSABLE) ×6 IMPLANT
TRAY FOLEY CATH SILVER 16FR (SET/KITS/TRAYS/PACK) ×2 IMPLANT
TRENDGUARD 450 HYBRID PRO PACK (MISCELLANEOUS) ×2
TRENDGUARD 600 HYBRID PROC PK (MISCELLANEOUS)
TROCAR 12M 150ML BLUNT (TROCAR) ×2 IMPLANT
TROCAR DISP BLADELESS 8 DVNC (TROCAR) ×1 IMPLANT
TROCAR DISP BLADELESS 8MM (TROCAR) ×1
TROCAR PORT AIRSEAL 5X120 (TROCAR) ×1 IMPLANT

## 2017-04-02 NOTE — Anesthesia Procedure Notes (Signed)
Procedure Name: Intubation Date/Time: 04/02/2017 7:35 AM Performed by: Jonna Munro Pre-anesthesia Checklist: Patient identified, Emergency Drugs available, Suction available, Timeout performed and Patient being monitored Patient Re-evaluated:Patient Re-evaluated prior to inductionOxygen Delivery Method: Circle system utilized Preoxygenation: Pre-oxygenation with 100% oxygen Intubation Type: IV induction Laryngoscope Size: Mac and 3 Grade View: Grade I Tube type: Oral Tube size: 7.0 mm Number of attempts: 1 Airway Equipment and Method: Stylet Secured at: 21 cm Tube secured with: Tape Dental Injury: Teeth and Oropharynx as per pre-operative assessment

## 2017-04-02 NOTE — Progress Notes (Signed)
Spoke with Dr Cletis Media regarding pt discharge. Pt has voided (600cc) and ambulated in the halls. Tolerating regular diet with no issue, positive bowel sounds. Per Dr Cletis Media, ok to discharge patient, AVS to be completed and will review d/c instructions with patient

## 2017-04-02 NOTE — Progress Notes (Signed)
Pt discharged home per orders via wheelchair, girlfriend driving patient home. Discharge instructions and incision care reviewed with patient and visitor at bedside. Reviewed emergent symptoms and when to call MD. All belongings sent home with patient, Rxs given to patient

## 2017-04-02 NOTE — Interval H&P Note (Signed)
History and Physical Interval Note:  04/02/2017 7:14 AM  Paula Evans  has presented today for surgery, with the diagnosis of Uterine Fibroids  The various methods of treatment have been discussed with the patient and family. After consideration of risks, benefits and other options for treatment, the patient has consented to  Procedure(s): ROBOTIC ASSISTED MYOMECTOMY with Morcellation (N/A) as a surgical intervention .  The patient's history has been reviewed, patient examined, no change in status, stable for surgery.  I have reviewed the patient's chart and labs.  Questions were answered to the patient's satisfaction.     Paula Evans A

## 2017-04-02 NOTE — Op Note (Signed)
Preoperative diagnosis: Symptomatic uterine fibroids   Postoperative diagnosis: Same   Anesthesia: General   Anesthesiologist: Dr. Annye Asa  Procedure: Robotically assisted myomectomy   Surgeon: Dr. Katharine Look Shuree Brossart   Assistant: Earnstine Regal P.A.-C .  Estimated blood loss: 500 cc   Procedure:   After being informed of the planned procedure with possible complications including but not limited to bleeding, infection, injury to other organs, need for laparotomy, possible need for morcellation with risks and benefits reviewed, expected hospital stay and recovery, informed consent is obtained and patient is taken to or #7. She is placed in lithotomy position  with both arms padded and tucked on each side and bilateral knee-high sequential compressive devices. She is given general anesthesia with endotracheal intubation without any complication. She is prepped and draped in a sterile fashion. A three-way Foley catheter is inserted in her bladder.   Pelvic exam reveals: 20 week size uterus with large dominant posterior fibroid. Adnexa are not felt.  A weighted speculum is inserted in the vagina and the anterior lip of the cervix is grasped with a tenaculum forcep. . The uterus was then sounded at 10 cm. We easily dilate the cervix using Hegar dilator to #27 which allows for easy placement of the intrauterine RUMI manipulator.  Trocar placement is decided. We infiltrate 6 cm above the umbilicus with 10 cc of ropivacaine per protocol and perform a 10 mm vertical incision which is brought down bluntly to the fascia. The fascia is identified and grasped with Coker forceps. The fascia is incised with Mayo scissors. Peritoneum is entered bluntly. A pursestring suture of 0 Vicryl is placed on the fascia and a 10 mm Hassan trocar is easily inserted in the abdominal cavity held in placed with a Purstring suture. This allows for easy insufflation of a pneumoperitoneum using warmed CO2 at a maximum  pressure of 15 mm of mercury. 60 cc of Ropivacaine 0.5 % diluted 1 in 1 is sent in the pelvis and the patient is positioned in reverse Trendelenburg. We then placed two 92mm robotic trocar on the left, one 97mm robotic trocar on the right and one 10 mm patient's side assistant trocar on the right after infiltrating every site with ropivacaine per protocol. The robot is docked on the left of the patient after positioning her in Trendelenburg. A monopolar scissor is inserted in arm #1, a PK gyrus forcep is inserted in arm #2 and a Tenaculum is inserted in arm #3.  Preparation and docking is completed in 58 minutes.   Observation: The uterus is enlarged with a predominant posterior fibroid measuring 12 cm displacing the uterus anteriorly and to the left. 2 fibroids attached to each other are located on the left cornua at the level of the left round ligament , are pedunculated with a 4 cm stalk and measure 5 and 7 cm.  Both tubes and ovaries are seen after the myomectomy is completed and are normal. Both anterior and posterior cul-de-sac are normal. Liver is visualized and normal. Appendix is  Visualized and normal.   Using vasopressin at a dilution of 20 units in 100 cc of saline until we infiltrate each fibroid to obtain complete blanching. We proceed with systematic dissection of all fibroids using monopolar scissors and traction/contr-traction. We are successful in enucleating all 3 fibroids through a single posterior incision measuring 10 cm. This myometrial defect is repaired in 3 deep layers using running 0 V-Lock sutures to obtain satisfactory hemostasis. The serosa is the closed with a  baseball suture of 2-0 V-Lock.  We irrigated profusely to confirm a satisfactory hemostasis. A 2 sheets of Interceed are positioned to cover the incision completely. . Console time is completed in 2 hours  and 16 minutes.   All instruments are then removed and the robot is undocked.   The patient side assistant 10 mm  trocar is replaced with the morcellator under direct visualization. We then proceed with systematic morcellation of the specimen which takes 82 minutes.   We irrigated profusely to again confirm a satisfactory hemostasis.   All trochars are removed under direct visualization after evacuating the pneumoperitoneum.   The fascia of the supraumbilical incision is closed with the previously placed pursestring suture of 0 Vicryl. the fascia of the right patient side assistant trocar is closed with a figure-of-eight stitch of 0 Vicryl. All incisions are then closed with subcuticular suture of 3-0 Monocryl and Dermabond.   A speculum is inserted in the vagina. A Vicryl 2-0 suture is required on the anterior lip of the cervix to achieve  adequate hemostasis on the cervix.   Instrument and sponge count is complete x2. Estimated blood loss is 500 cc.   The procedure is well tolerated by the patient who  is taken to recovery room in a well and stable condition.   Specimen: Fibroids weighing 785 g sent to pathology.

## 2017-04-02 NOTE — H&P (Signed)
FAMILY GIVEN AN UPDATE ON PT'S PROGRESS IN SURGERY @ 1120AM.

## 2017-04-02 NOTE — Anesthesia Postprocedure Evaluation (Signed)
Anesthesia Post Note  Patient: Paula Evans  Procedure(s) Performed: Procedure(s) (LRB): ROBOTIC ASSISTED MYOMECTOMY with Morcellation (N/A)     Patient location during evaluation: PACU Anesthesia Type: General Level of consciousness: awake and sedated Pain management: pain level controlled Vital Signs Assessment: post-procedure vital signs reviewed and stable Respiratory status: spontaneous breathing Cardiovascular status: stable Postop Assessment: no signs of nausea or vomiting Anesthetic complications: no    Last Vitals:  Vitals:   04/02/17 1500 04/02/17 1524  BP: 110/72 117/66  Pulse: 89   Resp: 16 16  Temp: 36.8 C     Last Pain:  Vitals:   04/02/17 1524  TempSrc:   PainSc: 0-No pain   Pain Goal: Patients Stated Pain Goal: 3 (04/02/17 5974)               Cameron Schwinn JR,JOHN Mateo Flow

## 2017-04-02 NOTE — Anesthesia Preprocedure Evaluation (Signed)
Anesthesia Evaluation  Patient identified by MRN, date of birth, ID band Patient awake    Reviewed: Allergy & Precautions, NPO status , Patient's Chart, lab work & pertinent test results  History of Anesthesia Complications (+) PONV  Airway Mallampati: I  TM Distance: >3 FB Neck ROM: Full    Dental  (+) Dental Advisory Given, Teeth Intact   Pulmonary Current Smoker,    breath sounds clear to auscultation       Cardiovascular (-) anginanegative cardio ROS   Rhythm:Regular     Neuro/Psych negative neurological ROS     GI/Hepatic Neg liver ROS, GERD  Poorly Controlled,  Endo/Other  Morbid obesity  Renal/GU negative Renal ROS     Musculoskeletal   Abdominal (+) + obese,   Peds  Hematology negative hematology ROS (+)   Anesthesia Other Findings   Reproductive/Obstetrics                             Anesthesia Physical Anesthesia Plan  ASA: II  Anesthesia Plan: General   Post-op Pain Management:    Induction: Intravenous  PONV Risk Score and Plan: 4 or greater and Ondansetron, Dexamethasone, Propofol, Midazolam and Scopolamine patch - Pre-op  Airway Management Planned: Oral ETT  Additional Equipment:   Intra-op Plan:   Post-operative Plan: Extubation in OR  Informed Consent: I have reviewed the patients History and Physical, chart, labs and discussed the procedure including the risks, benefits and alternatives for the proposed anesthesia with the patient or authorized representative who has indicated his/her understanding and acceptance.   Dental advisory given  Plan Discussed with: CRNA and Surgeon  Anesthesia Plan Comments: (Plan routine monitors, GETA)        Anesthesia Quick Evaluation

## 2017-04-02 NOTE — Discharge Instructions (Signed)
Call [facility] @ [phone] if:  You have a temperature greater than or equal to 100.4 degrees Farenheit orally You have pain that is not made better by the pain medication given and taken as directed You have excessive bleeding or problems urinating  Take Colace (Docusate Sodium/Stool Softener) 100 mg 2-3 times daily while taking narcotic pain medicine to avoid constipation or until bowel movements are regular. Take, with food, Ibuprofen 600 mg every 6 hours for 5 days then as needed for pain  You may drive after 48 hours You may shower tomorrow  You may resume a regular diet Keep incisions clean and dry Avoid anything in vagina until after your post-operative visit

## 2017-04-02 NOTE — Transfer of Care (Signed)
Immediate Anesthesia Transfer of Care Note  Patient: Paula Evans  Procedure(s) Performed: Procedure(s): ROBOTIC ASSISTED MYOMECTOMY with Morcellation (N/A)  Patient Location: PACU  Anesthesia Type:General  Level of Consciousness: awake, alert  and oriented  Airway & Oxygen Therapy: Patient Spontanous Breathing and Patient connected to nasal cannula oxygen  Post-op Assessment: Report given to RN and Post -op Vital signs reviewed and stable  Post vital signs: Reviewed and stable  Last Vitals:  Vitals:   04/02/17 0611  BP: 119/77  Pulse: 68  Resp: 18  Temp: 36.8 C    Last Pain:  Vitals:   04/02/17 0611  TempSrc: Oral      Patients Stated Pain Goal: 3 (89/37/34 2876)  Complications: No apparent anesthesia complications

## 2017-04-03 ENCOUNTER — Encounter (HOSPITAL_COMMUNITY): Payer: Self-pay | Admitting: Obstetrics and Gynecology

## 2017-05-29 ENCOUNTER — Encounter: Payer: Self-pay | Admitting: Obstetrics and Gynecology

## 2017-05-29 ENCOUNTER — Other Ambulatory Visit: Payer: Self-pay

## 2017-12-04 ENCOUNTER — Encounter: Payer: Self-pay | Admitting: Obstetrics and Gynecology

## 2018-01-15 ENCOUNTER — Telehealth: Payer: Self-pay | Admitting: Family Medicine

## 2018-01-15 DIAGNOSIS — Z13220 Encounter for screening for lipoid disorders: Secondary | ICD-10-CM

## 2018-01-15 NOTE — Telephone Encounter (Signed)
-----   Message from Ellamae Sia sent at 01/07/2018 11:18 AM EDT ----- Regarding: Lab orders for Friday, 3.22.19 Patient is scheduled for CPX labs, please order future labs, Thanks , Karna Christmas

## 2018-01-16 ENCOUNTER — Other Ambulatory Visit (INDEPENDENT_AMBULATORY_CARE_PROVIDER_SITE_OTHER): Payer: BLUE CROSS/BLUE SHIELD

## 2018-01-16 DIAGNOSIS — Z13220 Encounter for screening for lipoid disorders: Secondary | ICD-10-CM

## 2018-01-16 LAB — COMPREHENSIVE METABOLIC PANEL
ALBUMIN: 4.2 g/dL (ref 3.5–5.2)
ALK PHOS: 40 U/L (ref 39–117)
ALT: 12 U/L (ref 0–35)
AST: 16 U/L (ref 0–37)
BUN: 10 mg/dL (ref 6–23)
CO2: 25 mEq/L (ref 19–32)
CREATININE: 0.84 mg/dL (ref 0.40–1.20)
Calcium: 9.1 mg/dL (ref 8.4–10.5)
Chloride: 106 mEq/L (ref 96–112)
GFR: 83.77 mL/min (ref >60.00)
Glucose, Bld: 91 mg/dL (ref 70–99)
POTASSIUM: 4 meq/L (ref 3.5–5.1)
SODIUM: 138 meq/L (ref 135–145)
TOTAL PROTEIN: 7.2 g/dL (ref 6.0–8.3)
Total Bilirubin: 0.5 mg/dL (ref 0.2–1.2)

## 2018-01-16 LAB — LIPID PANEL
Cholesterol: 160 mg/dL (ref 0–200)
HDL: 50.3 mg/dL (ref >39.00)
LDL Cholesterol: 99 mg/dL (ref 0–99)
NonHDL: 109.82
Total CHOL/HDL Ratio: 3
Triglycerides: 53 mg/dL (ref 0.0–149.0)
VLDL: 10.6 mg/dL (ref 0.0–40.0)

## 2018-01-20 ENCOUNTER — Ambulatory Visit (INDEPENDENT_AMBULATORY_CARE_PROVIDER_SITE_OTHER): Payer: BLUE CROSS/BLUE SHIELD | Admitting: Family Medicine

## 2018-01-20 ENCOUNTER — Other Ambulatory Visit: Payer: Self-pay

## 2018-01-20 ENCOUNTER — Other Ambulatory Visit: Payer: Self-pay | Admitting: *Deleted

## 2018-01-20 ENCOUNTER — Encounter: Payer: Self-pay | Admitting: Family Medicine

## 2018-01-20 VITALS — BP 104/70 | HR 65 | Temp 98.7°F | Ht 65.0 in | Wt 190.0 lb

## 2018-01-20 DIAGNOSIS — Z0000 Encounter for general adult medical examination without abnormal findings: Secondary | ICD-10-CM

## 2018-01-20 DIAGNOSIS — Z113 Encounter for screening for infections with a predominantly sexual mode of transmission: Secondary | ICD-10-CM | POA: Diagnosis not present

## 2018-01-20 DIAGNOSIS — Z23 Encounter for immunization: Secondary | ICD-10-CM

## 2018-01-20 DIAGNOSIS — E669 Obesity, unspecified: Secondary | ICD-10-CM | POA: Diagnosis not present

## 2018-01-20 NOTE — Patient Instructions (Signed)
Work on low cholesterol heart healthy diet.  Incease exercise and work on weight loss.  Please stop at the lab to have labs drawn.

## 2018-01-20 NOTE — Assessment & Plan Note (Signed)
Encouraged exercise, weight loss, healthy eating habits. ? ?

## 2018-01-20 NOTE — Progress Notes (Signed)
   Subjective:    Patient ID: Paula Evans, female    DOB: 19-Nov-1985, 32 y.o.   MRN: [id number]  HPI  The patient is here for annual wellness exam and preventative care.     MDD, recurrent in remission: ON no medication. Bipolar disorder   Body mass index is 31.62 kg/m. Diet: Poor control, good water, limited fruits and veggies  Exercise: none.   [employer] at [employer] History /Family History/Past Medical History reviewed in detail and updated in EMR if needed. Blood pressure 104/70, pulse 65, temperature 98.7 F (37.1 C), temperature source Oral, height 5\' 5"  (1.651 m), weight 190 lb (86.2 kg), last menstrual period 01/16/2018.  Review of Systems  Constitutional: Negative for fatigue and fever.  HENT: Negative for congestion.   Eyes: Negative for pain.  Respiratory: Negative for cough and shortness of breath.   Cardiovascular: Negative for chest pain, palpitations and leg swelling.  Gastrointestinal: Negative for abdominal pain.  Genitourinary: Negative for dysuria and vaginal bleeding.  Musculoskeletal: Negative for back pain.  Neurological: Negative for syncope, light-headedness and headaches.  Psychiatric/Behavioral: Negative for dysphoric mood.       Objective:   Physical Exam  Constitutional: Vital signs are normal. She appears well-developed and well-nourished. She is cooperative.  Non-toxic appearance. She does not appear ill. No distress.  HENT:  Head: Normocephalic.  Right Ear: Hearing, tympanic membrane, external ear and ear canal normal.  Left Ear: Hearing, tympanic membrane, external ear and ear canal normal.  Nose: Nose normal.  Eyes: Pupils are equal, round, and reactive to light. Conjunctivae, EOM and lids are normal. Lids are everted and swept, no foreign bodies found.  Neck: Trachea normal and normal range of motion. Neck supple. Carotid bruit is not present. No thyroid mass and no thyromegaly present.  Cardiovascular: Normal rate, regular rhythm, S1  normal, S2 normal, normal heart sounds and intact distal pulses. Exam reveals no gallop.  No murmur heard. Pulmonary/Chest: Effort normal and breath sounds normal. No respiratory distress. She has no wheezes. She has no rhonchi. She has no rales.  Abdominal: Soft. Normal appearance and bowel sounds are normal. She exhibits no distension, no fluid wave, no abdominal bruit and no mass. There is no hepatosplenomegaly. There is no tenderness. There is no rebound, no guarding and no CVA tenderness. No hernia.  Lymphadenopathy:    She has no cervical adenopathy.    She has no axillary adenopathy.  Neurological: She is alert. She has normal strength. No cranial nerve deficit or sensory deficit.  Skin: Skin is warm, dry and intact. No rash noted.  Psychiatric: Her speech is normal and behavior is normal. Judgment normal. Her mood appears not anxious. Cognition and memory are normal. She does not exhibit a depressed mood.          Assessment & Plan:  The patient's preventative maintenance and recommended screening tests for an annual wellness exam were reviewed in full today. Brought up to date unless services declined.  Counselled on the importance of diet, exercise, and its role in overall health and mortality. The patient's FH and SH was reviewed, including their home life, tobacco status, and drug and alcohol status.   Vaccines: Uptodate Pap/DVE:  With Dr. Cletis Media GYN. Mammo:  Not indicated Colon: no  Early colon cancer  Smoking Status: non smoker ETOH/ drug use: beer occ  HIV screen:   [facility].

## 2018-01-21 LAB — HEPATITIS PANEL, ACUTE
HEP A IGM: NONREACTIVE
HEP B C IGM: NONREACTIVE
HEP B S AG: NONREACTIVE
HEP C AB: NONREACTIVE
SIGNAL TO CUT-OFF: 0.01 (ref <1.00)

## 2018-01-21 LAB — RPR: RPR: REACTIVE — AB

## 2018-01-21 LAB — FLUORESCENT TREPONEMAL AB(FTA)-IGG-BLD: Fluorescent Treponemal ABS: NONREACTIVE

## 2018-01-21 LAB — RPR TITER: RPR Titer: 1:1 {titer} — ABNORMAL HIGH

## 2018-01-21 LAB — HIV ANTIBODY (ROUTINE TESTING W REFLEX): HIV 1&2 Ab, 4th Generation: NONREACTIVE

## 2018-01-28 ENCOUNTER — Telehealth: Payer: Self-pay | Admitting: Family Medicine

## 2018-01-28 NOTE — Telephone Encounter (Signed)
Copied from [place] [id number]. Topic: Quick Communication - Lab Results >> Jan 28, 2018 10:04 AM Darl Householder, RMA wrote: Patient is requesting lab results

## 2018-01-29 ENCOUNTER — Other Ambulatory Visit: Payer: Self-pay | Admitting: Family Medicine

## 2018-01-29 DIAGNOSIS — A530 Latent syphilis, unspecified as early or late: Secondary | ICD-10-CM

## 2018-01-29 NOTE — Telephone Encounter (Signed)
Let pt know I will call her back today at lunch 1-2PM or after clinic 5 PM

## 2018-01-29 NOTE — Telephone Encounter (Signed)
Dr. Diona Browner discussed results in person with patient today.

## 2018-03-03 ENCOUNTER — Other Ambulatory Visit: Payer: BLUE CROSS/BLUE SHIELD

## 2018-03-04 ENCOUNTER — Other Ambulatory Visit (INDEPENDENT_AMBULATORY_CARE_PROVIDER_SITE_OTHER): Payer: BLUE CROSS/BLUE SHIELD

## 2018-03-04 DIAGNOSIS — A530 Latent syphilis, unspecified as early or late: Secondary | ICD-10-CM | POA: Diagnosis not present

## 2018-03-05 LAB — FLUORESCENT TREPONEMAL AB(FTA)-IGG-BLD: Fluorescent Treponemal ABS: NONREACTIVE

## 2018-05-06 DIAGNOSIS — Z6832 Body mass index (BMI) 32.0-32.9, adult: Secondary | ICD-10-CM | POA: Diagnosis not present

## 2018-05-06 DIAGNOSIS — Z124 Encounter for screening for malignant neoplasm of cervix: Secondary | ICD-10-CM | POA: Diagnosis not present

## 2018-05-06 DIAGNOSIS — Z01419 Encounter for gynecological examination (general) (routine) without abnormal findings: Secondary | ICD-10-CM | POA: Diagnosis not present

## 2018-11-12 ENCOUNTER — Encounter: Payer: Self-pay | Admitting: Family Medicine

## 2018-11-12 ENCOUNTER — Ambulatory Visit: Payer: BLUE CROSS/BLUE SHIELD | Admitting: Family Medicine

## 2018-11-12 VITALS — BP 94/60 | HR 70 | Temp 98.7°F | Ht 65.0 in | Wt 192.5 lb

## 2018-11-12 DIAGNOSIS — R059 Cough, unspecified: Secondary | ICD-10-CM

## 2018-11-12 DIAGNOSIS — R05 Cough: Secondary | ICD-10-CM

## 2018-11-12 LAB — POC INFLUENZA A&B (BINAX/QUICKVUE)
INFLUENZA A, POC: NEGATIVE
INFLUENZA B, POC: NEGATIVE

## 2018-11-12 MED ORDER — OSELTAMIVIR PHOSPHATE 75 MG PO CAPS: 75.0000 mg | ORAL_CAPSULE | Freq: Two times a day (BID) | ORAL | 0 refills | Status: DC

## 2018-11-12 NOTE — Patient Instructions (Signed)
Presumed flu.  Start tamiflu.  Rest and fluids.  Tylenol if needed.  Out of work for now, back when no fever or sweats.   Take care.  Glad to see you.

## 2018-11-12 NOTE — Progress Notes (Signed)
She is a little lightheaded.  No syncope. H/o lower BP at baseline.   Sx started last night.  Nasal irritation.  Took theraflu last night.  No known fevers but had sweats and felt hot.  Diffuse aches.  Quick onset of sx.  Cough.  No sputum.  No ear pain.  Some facial pain.  No vomiting, no diarrhea.  Some nausea. No ST.    Sick exposures at work.    Meds, vitals, and allergies reviewed.   ROS: Per HPI unless specifically indicated in ROS section   GEN: nad, alert and oriented HEENT: mucous membranes moist, tm w/o erythema, nasal exam w/o erythema, clear discharge noted,  OP with cobblestoning NECK: supple w/o LA CV: rrr.   PULM: ctab, no inc wob EXT: no edema SKIN: no acute rash  Flu test neg, presumed false neg.

## 2018-11-15 DIAGNOSIS — R059 Cough, unspecified: Secondary | ICD-10-CM | POA: Insufficient documentation

## 2018-11-15 DIAGNOSIS — R05 Cough: Secondary | ICD-10-CM | POA: Insufficient documentation

## 2018-11-15 NOTE — Assessment & Plan Note (Signed)
Presumed flu.  Presumed false negative on testing.  Discussed with patient. Start tamiflu.  Rest and fluids.  Tylenol if needed.  Out of work for now, back when no fever or sweats.   She agrees.  Okay for outpatient follow-up.

## 2019-02-04 ENCOUNTER — Other Ambulatory Visit: Payer: Self-pay

## 2019-02-09 ENCOUNTER — Encounter: Payer: Self-pay | Admitting: Family Medicine

## 2019-05-07 ENCOUNTER — Ambulatory Visit (INDEPENDENT_AMBULATORY_CARE_PROVIDER_SITE_OTHER): Payer: BC Managed Care – PPO | Admitting: Licensed Clinical Social Worker

## 2019-05-07 DIAGNOSIS — R4586 Emotional lability: Secondary | ICD-10-CM | POA: Diagnosis not present

## 2019-05-07 DIAGNOSIS — F332 Major depressive disorder, recurrent severe without psychotic features: Secondary | ICD-10-CM | POA: Diagnosis not present
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
## 2019-05-17 ENCOUNTER — Other Ambulatory Visit (INDEPENDENT_AMBULATORY_CARE_PROVIDER_SITE_OTHER): Payer: BC Managed Care – PPO

## 2019-05-17 ENCOUNTER — Telehealth: Payer: Self-pay | Admitting: Family Medicine

## 2019-05-17 ENCOUNTER — Other Ambulatory Visit: Payer: Self-pay

## 2019-05-17 DIAGNOSIS — Z13220 Encounter for screening for lipoid disorders: Secondary | ICD-10-CM

## 2019-05-17 LAB — COMPREHENSIVE METABOLIC PANEL
ALT: 20 U/L (ref 0–35)
AST: 23 U/L (ref 0–37)
Albumin: 4 g/dL (ref 3.5–5.2)
Alkaline Phosphatase: 43 U/L (ref 39–117)
BUN: 10 mg/dL (ref 6–23)
CO2: 29 mEq/L (ref 19–32)
Calcium: 9 mg/dL (ref 8.4–10.5)
Chloride: 104 mEq/L (ref 96–112)
Creatinine, Ser: 0.78 mg/dL (ref 0.40–1.20)
GFR: 85.14 mL/min (ref >60.00)
Glucose, Bld: 88 mg/dL (ref 70–99)
Potassium: 4.4 mEq/L (ref 3.5–5.1)
Sodium: 140 mEq/L (ref 135–145)
Total Bilirubin: 0.4 mg/dL (ref 0.2–1.2)
Total Protein: 6.4 g/dL (ref 6.0–8.3)

## 2019-05-17 LAB — LIPID PANEL
Cholesterol: 154 mg/dL (ref 0–200)
HDL: 52 mg/dL (ref >39.00)
LDL Cholesterol: 81 mg/dL (ref 0–99)
NonHDL: 101.68
Total CHOL/HDL Ratio: 3
Triglycerides: 101 mg/dL (ref 0.0–149.0)
VLDL: 20.2 mg/dL (ref 0.0–40.0)

## 2019-05-17 NOTE — Telephone Encounter (Signed)
-----   Message from Ellamae Sia sent at 05/11/2019  2:07 PM EDT ----- Regarding: Lab orders for Monday, 7.20.20 Patient is scheduled for CPX labs, please order future labs, Thanks , Karna Christmas

## 2019-05-20 ENCOUNTER — Ambulatory Visit: Payer: BC Managed Care – PPO | Admitting: Family Medicine

## 2019-05-20 ENCOUNTER — Encounter: Payer: Self-pay | Admitting: Family Medicine

## 2019-05-20 VITALS — Ht 65.0 in | Wt 192.0 lb
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
## 2019-05-24 ENCOUNTER — Other Ambulatory Visit: Payer: Self-pay

## 2019-05-24 ENCOUNTER — Ambulatory Visit (INDEPENDENT_AMBULATORY_CARE_PROVIDER_SITE_OTHER): Payer: BC Managed Care – PPO | Admitting: Licensed Clinical Social Worker

## 2019-05-24 DIAGNOSIS — F332 Major depressive disorder, recurrent severe without psychotic features: Secondary | ICD-10-CM

## 2019-05-24 DIAGNOSIS — R4586 Emotional lability: Secondary | ICD-10-CM | POA: Diagnosis not present
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
## 2019-05-26 DIAGNOSIS — Z01419 Encounter for gynecological examination (general) (routine) without abnormal findings: Secondary | ICD-10-CM | POA: Diagnosis not present

## 2019-05-26 DIAGNOSIS — Z113 Encounter for screening for infections with a predominantly sexual mode of transmission: Secondary | ICD-10-CM | POA: Diagnosis not present

## 2019-05-26 DIAGNOSIS — Z3040 Encounter for surveillance of contraceptives, unspecified: Secondary | ICD-10-CM | POA: Diagnosis not present

## 2019-05-26 DIAGNOSIS — Z6832 Body mass index (BMI) 32.0-32.9, adult: Secondary | ICD-10-CM | POA: Diagnosis not present

## 2019-06-07 ENCOUNTER — Ambulatory Visit (INDEPENDENT_AMBULATORY_CARE_PROVIDER_SITE_OTHER): Payer: BC Managed Care – PPO | Admitting: Licensed Clinical Social Worker

## 2019-06-07 DIAGNOSIS — F332 Major depressive disorder, recurrent severe without psychotic features: Secondary | ICD-10-CM

## 2019-06-07 DIAGNOSIS — R4586 Emotional lability: Secondary | ICD-10-CM

## 2019-06-07 NOTE — Progress Notes (Signed)
Virtual Visit via Video Note  I connected with [name] on 06/07/19 at  8:00 AM EDT by a video enabled telemedicine application and verified that I am speaking with the correct person using two identifiers.   I discussed the limitations of evaluation and management by telemedicine and the availability of in person appointments. The patient expressed understanding and agreed to proceed.  Follow Up Instructions:    I discussed the assessment and treatment plan with the patient. The patient was provided an opportunity to ask questions and all were answered. The patient agreed with the plan and demonstrated an understanding of the instructions.   The patient was advised to call back or seek an in-person evaluation if the symptoms worsen or if the condition fails to improve as anticipated.  I provided 53 minutes of non-face-to-face time during this encounter.  THERAPIST PROGRESS NOTE  Session Time: 8:01 AM to 8:54 AM  Participation Level: Active  Behavioral Response: CasualAlertAnxious and Dysphoric  Type of Therapy: Individual Therapy  Treatment Goals addressed:  decrease depression, better communication, coping Interventions: DBT, Solution Focused, Strength-based, Supportive, Reframing and Other: coping  Summary: NEEVA TREW is a 33 y.o. female who presents with ok and another thing has happened. Still feels that she is  capable to function through all of this. does ask herself why does it keep happening? What did I do to deserve it? I am a good person and help people when I can. Time like these ask why does it keep happening, what's next?   Doesn't help depression.  Feels bad that mom told boyfriend about bedbugs in her mattress.  He doesn't need to know.  Reviewed strategies to manage stressors discussed riding the wave of emotion not to stay stuck in negativity.  Flashbacks-with overload of stress of new problem, flashbacks have gone into beginning of this year. There is a  "fire inside my brain" with everything I dealt with back in March. Break up and emotions going through it, haven't gotten better. Reviewed dissecting of emotion and patient describes sadness, feeling anxious and anger. hinking about it like disbelief that she would do this to me, I was always supportive of her Reviewed checking the facts. Agrees not a good person. Thought about keeping the relationship because of her daughter. At what cost do I keep this thing going. It is going to drive me crazy, my mental health will suffer. Maintaining relationship will bring back everything up, emotions, memories.  Therapist pointed out not having people in her life that hurt you. Rates. Anger 5-6 because of everything going on, haven't been able to feel it but push it to the side.  Anxiety the worst because of everything due to new apartment, first day of service Reviewed session and therapist pointed highlights of not taking things personally, reviewing the past regarding the past and seeing relationship as a hurtful one. Patient shares she got from today to go through the flow and accept it for what it is, don't get sucked up in negativity, come up with better strategy. Learning about mindfulness to control thoughts. Don't let stressors pile up.  Suicidal/Homicidal: No  Therapist Response: Therapist assessed patient current functioning per report and processed feelings related to current stressors.  Validated patient on how she was feeling and also worked on assessment of current situation that is realistic and will be helpful.  Not to personalize and think she is the only one having problems, shared from therapist perspective that it is part  of life part of the human experience of dealing with new stressors that come up, but also related life can be like a weight going up and down, at times things are more difficult other times a little easier. Reviewed dissecting an emotion to help when dysregulated encourage  patient emotional management when calm down, when emotional brain is in chart one does not look at things closely accurately but when one is calm down 1 looks at things more rationally, rational brain takes over, often helps and unhooking [place] from negative emotions.  Reviewed emotional regulation strategy of mindfulness, will work on and helps patient have control of thoughts.  First step in dissecting emotion is to name and labeled the emotion.  Doing this helps [place] get grounded in reality and allows [place] to try to solve the problem. When we know what we're feeling, we can take better care of ourselves, let others know what is going on with [place], and take steps to address the emotion at hand. forever. Rather than believe everything I think, getting swept up in despair and intense emotion at such a terrible thought, I can notice and separate the thought out as a thought instead of a fact.  Worked on applying this to patient situation of memories surfacing of relationship of past.  Suggested that may be another way to look at it is that she is not such a great person and may be better not to have in her life.  Next step is to rate her motion and did this with patient.  Third step is to look at what is going on, identify emotional vulnerabilities. Reviewed vulnerabilities and patient relates related to current stressors.  Reviewed approach to current stressors including reframe to positive, doing what she can do now, listing of options but cannot do something about it now to focus on something else.  Encouraged taking a step-by-step approach in dealing with stressors. Provided supportive and strength-based interventions.   Plan: Return again in 2 weeks.2.Therapist work with patient on mood regulation skills, coping. 3.patient review video "Why mindfulness is a super hero"  Diagnosis: Axis I:  major depressive disorder, recurrent, severe, mood swings    Axis II: No diagnosis    Cordella Register,  LCSW 06/07/2019

## 2019-06-16 ENCOUNTER — Other Ambulatory Visit: Payer: Self-pay

## 2019-06-16 ENCOUNTER — Encounter (HOSPITAL_COMMUNITY): Payer: Self-pay | Admitting: Psychiatry

## 2019-06-16 ENCOUNTER — Ambulatory Visit (INDEPENDENT_AMBULATORY_CARE_PROVIDER_SITE_OTHER): Payer: BC Managed Care – PPO | Admitting: Psychiatry

## 2019-06-16 DIAGNOSIS — R4586 Emotional lability: Secondary | ICD-10-CM

## 2019-06-16 DIAGNOSIS — F332 Major depressive disorder, recurrent severe without psychotic features: Secondary | ICD-10-CM

## 2019-06-16 DIAGNOSIS — F411 Generalized anxiety disorder: Secondary | ICD-10-CM | POA: Diagnosis not present

## 2019-06-16 MED ORDER — ESCITALOPRAM OXALATE 10 MG PO TABS: 10.0000 mg | ORAL_TABLET | Freq: Every day | ORAL | 0 refills | Status: DC
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
## 2019-06-17 ENCOUNTER — Encounter: Payer: Self-pay | Admitting: Family Medicine

## 2019-06-17 ENCOUNTER — Ambulatory Visit (INDEPENDENT_AMBULATORY_CARE_PROVIDER_SITE_OTHER): Payer: BC Managed Care – PPO | Admitting: Family Medicine

## 2019-06-17 VITALS — Ht 65.0 in | Wt 195.0 lb

## 2019-06-17 DIAGNOSIS — Z0000 Encounter for general adult medical examination without abnormal findings: Secondary | ICD-10-CM

## 2019-06-17 DIAGNOSIS — F332 Major depressive disorder, recurrent severe without psychotic features: Secondary | ICD-10-CM | POA: Diagnosis not present

## 2019-06-17 DIAGNOSIS — N3000 Acute cystitis without hematuria: Secondary | ICD-10-CM

## 2019-06-17 DIAGNOSIS — N390 Urinary tract infection, site not specified: Secondary | ICD-10-CM | POA: Insufficient documentation

## 2019-06-17 NOTE — Assessment & Plan Note (Signed)
Currently being empirically treated with nitrofurantoin.  If not continuing to improve pt will need UA, cx and in office exam. She is aware.

## 2019-06-17 NOTE — Patient Instructions (Addendum)
If urinary and bowel issue ar not improving with UTI treatment.. make an in office visit for UA.  Can push fluids, increase fiber and try probiotics.  Call to set up next years wellness.   Preventive Care 23-33 Years Old, Female Preventive care refers to visits with your health care provider and lifestyle choices that can promote health and wellness. This includes:  A yearly physical exam. This may also be called an annual well check.  Regular dental visits and eye exams.  Immunizations.  Screening for certain conditions.  Healthy lifestyle choices, such as eating a healthy diet, getting regular exercise, not using drugs or products that contain nicotine and tobacco, and limiting alcohol use. What can I expect for my preventive care visit? Physical exam Your health care provider will check your:  Height and weight. This may be used to calculate body mass index (BMI), which tells if you are at a healthy weight.  Heart rate and blood pressure.  Skin for abnormal spots. Counseling Your health care provider may ask you questions about your:  Alcohol, tobacco, and drug use.  Emotional well-being.  Home and relationship well-being.  Sexual activity.  Eating habits.  Work and work Statistician.  Method of birth control.  Menstrual cycle.  Pregnancy history. What immunizations do I need?  Influenza (flu) vaccine  This is recommended every year. Tetanus, diphtheria, and pertussis (Tdap) vaccine  You may need a Td booster every 10 years. Varicella (chickenpox) vaccine  You may need this if you have not been vaccinated. Human papillomavirus (HPV) vaccine  If recommended by your health care provider, you may need three doses over 6 months. Measles, mumps, and rubella (MMR) vaccine  You may need at least one dose of MMR. You may also need a second dose. Meningococcal conjugate (MenACWY) vaccine  One dose is recommended if you are age 52-21 years and a first-year  college student living in a residence hall, or if you have one of several medical conditions. You may also need additional booster doses. Pneumococcal conjugate (PCV13) vaccine  You may need this if you have certain conditions and were not previously vaccinated. Pneumococcal polysaccharide (PPSV23) vaccine  You may need one or two doses if you smoke cigarettes or if you have certain conditions. Hepatitis A vaccine  You may need this if you have certain conditions or if you travel or work in places where you may be exposed to hepatitis A. Hepatitis B vaccine  You may need this if you have certain conditions or if you travel or work in places where you may be exposed to hepatitis B. Haemophilus influenzae type b (Hib) vaccine  You may need this if you have certain conditions. You may receive vaccines as individual doses or as more than one vaccine together in one shot (combination vaccines). Talk with your health care provider about the risks and benefits of combination vaccines. What tests do I need?  Blood tests  Lipid and cholesterol levels. These may be checked every 5 years starting at age 26.  Hepatitis C test.  Hepatitis B test. Screening  Diabetes screening. This is done by checking your blood sugar (glucose) after you have not eaten for a while (fasting).  Sexually transmitted disease (STD) testing.  BRCA-related cancer screening. This may be done if you have a family history of breast, ovarian, tubal, or peritoneal cancers.  Pelvic exam and Pap test. This may be done every 3 years starting at age 58. Starting at age 32, this 27  be done every 5 years if you have a Pap test in combination with an HPV test. Talk with your health care provider about your test results, treatment options, and if necessary, the need for more tests. Follow these instructions at home: Eating and drinking   Eat a diet that includes fresh fruits and vegetables, whole grains, lean protein, and  low-fat dairy.  Take vitamin and mineral supplements as recommended by your health care provider.  Do not drink alcohol if: ? Your health care provider tells you not to drink. ? You are pregnant, may be pregnant, or are planning to become pregnant.  If you drink alcohol: ? Limit how much you have to 0-1 drink a day. ? Be aware of how much alcohol is in your drink. In the [place] one drink equals one 12 oz bottle of beer (355 mL), one 5 oz glass of wine (148 mL), or one 1 oz glass of hard liquor (44 mL). Lifestyle  Take daily care of your teeth and gums.  Stay active. Exercise for at least 30 minutes on 5 or more days each week.  Do not use any products that contain nicotine or tobacco, such as cigarettes, e-cigarettes, and chewing tobacco. If you need help quitting, ask your health care provider.  If you are sexually active, practice safe sex. Use a condom or other form of birth control (contraception) in order to prevent pregnancy and STIs (sexually transmitted infections). If you plan to become pregnant, see your health care provider for a preconception visit. What's next?  Visit your health care provider once a year for a well check visit.  Ask your health care provider how often you should have your eyes and teeth checked.  Stay up to date on all vaccines. This information is not intended to replace advice given to you by your health care provider. Make sure you discuss any questions you have with your health care provider. Document Released: 12/10/2001 Document Revised: 06/25/2018 Document Reviewed: 06/25/2018 Elsevier Patient Education  2020 [employer].

## 2019-06-17 NOTE — Assessment & Plan Note (Signed)
Followed by psychiatry. Now  lexapro.

## 2019-06-17 NOTE — Progress Notes (Signed)
VIRTUAL VISIT Due to national recommendations of social distancing due to [place] 19, a virtual visit is felt to be most appropriate for this patient at this time.   I connected with the patient on 06/17/19 at  3:00 PM EDT by virtual telehealth platform and verified that I am speaking with the correct person using two identifiers.   I discussed the limitations, risks, security and privacy concerns of performing an evaluation and management service by  virtual telehealth platform and the availability of in person appointments. I also discussed with the patient that there may be a patient responsible charge related to this service. The patient expressed understanding and agreed to proceed.  Patient location: Home Provider Location: [facility] Participants: Eliezer Lofts and [name]   Chief Complaint  Patient presents with  . Annual Exam    History of Present Illness: The patient is here for annual wellness exam and preventative care.    She reports she has been having a lot of abdominal bloating and  Frequent urination. No fever. No CVA ttp, mild back pain.  Tele doc treated her for UTI.  This has helped with her symptoms.. nitrofurantoin.  Also pain with BM... associated with UTI.  MDD bipolar disorder: Poor control but just started on Lexapro last night.  Seeing Dr. Jamelle Haring.  no issue sleeping.  She has been seeing therapist since 5-03/2019.   Office Visit from 06/17/2019 in [place] at [facility] Total Score  1     Reviewed labs in detail.  Diet: moderate  Exercise: minimal  COVID 19 screen No recent travel or known exposure to COVID19 The patient denies respiratory symptoms of COVID 19 at this time.  The importance of social distancing was discussed today.   Review of Systems  Constitutional: Negative for chills and fever.  HENT: Negative for congestion and ear pain.   Eyes: Negative for pain and redness.  Respiratory: Negative for cough and  shortness of breath.   Cardiovascular: Negative for chest pain, palpitations and leg swelling.  Gastrointestinal: Negative for abdominal pain, blood in stool, constipation, diarrhea, nausea and vomiting.  Genitourinary: Negative for dysuria.  Musculoskeletal: Negative for falls and myalgias.  Skin: Negative for rash.  Neurological: Negative for dizziness.  Psychiatric/Behavioral: Negative for depression. The patient is not nervous/anxious.       Past Medical History:  Diagnosis Date  . Abnormal Pap smear of cervix   . Anxiety   . Dysmenorrhea   . GERD (gastroesophageal reflux disease)    just occasionally - diet controlled  . History of chicken pox   . Major depression   . PONV (postoperative nausea and vomiting)     reports that she quit smoking about 5 years ago. Her smoking use included cigarettes. She has a 1.00 pack-year smoking history. She has never used smokeless tobacco. She reports current alcohol use of about 3.0 - 4.0 standard drinks of alcohol per week. She reports that she does not use drugs.   Current Outpatient Medications:  .  escitalopram (LEXAPRO) 10 MG tablet, Take 1 tablet (10 mg total) by mouth daily., Disp: 30 tablet, Rfl: 0 .  nitrofurantoin, macrocrystal-monohydrate, (MACROBID) 100 MG capsule, Take 1 capsule by mouth 2 (two) times daily., Disp: , Rfl:    Observations/Objective: Height 5\' 5"  (1.651 m), weight 195 lb (88.5 kg), last menstrual period 06/13/2019.  Physical Exam  Physical Exam Constitutional:      General: The patient is not in acute distress. Pulmonary:  Effort: Pulmonary effort is normal. No respiratory distress.  Neurological:     Mental Status: The patient is alert and oriented to person, place, and time.  Psychiatric:        Mood and Affect: Mood normal.        Behavior: Behavior normal.   Assessment and Plan The patient's preventative maintenance and recommended screening tests for an annual wellness exam were reviewed in full  today. Brought up to date unless services declined.  Counselled on the importance of diet, exercise, and its role in overall health and mortality. The patient's FH and SH was reviewed, including their home life, tobacco status, and drug and alcohol status.   Vaccines: Uptodate.  Pap/DVE:  With Dr. Cletis Media GYN. PAP 04/2019 plan q 3 years Mammo:  no early family history of breast cancer Not indicated Colon: no early colon cancer  Smoking Status: non smoker ETOH/ drug use: beer rare/ none HIV screen:  Plans at GYN.   Severe episode of recurrent major depressive disorder, without psychotic features ([place]) Followed by psychiatry. Now  lexapro.  UTI (urinary tract infection) Currently being empirically treated with nitrofurantoin.  If not continuing to improve pt will need UA, cx and in office exam. She is aware.   I discussed the assessment and treatment plan with the patient. The patient was provided an opportunity to ask questions and all were answered. The patient agreed with the plan and demonstrated an understanding of the instructions.   The patient was advised to call back or seek an in-person evaluation if the symptoms worsen or if the condition fails to improve as anticipated.     Eliezer Lofts, MD

## 2019-06-18 ENCOUNTER — Encounter: Payer: Self-pay | Admitting: Family Medicine

## 2019-06-18 ENCOUNTER — Telehealth: Payer: Self-pay | Admitting: Family Medicine

## 2019-06-18 ENCOUNTER — Other Ambulatory Visit: Payer: Self-pay

## 2019-06-18 ENCOUNTER — Encounter: Payer: Self-pay | Admitting: *Deleted

## 2019-06-18 ENCOUNTER — Ambulatory Visit (INDEPENDENT_AMBULATORY_CARE_PROVIDER_SITE_OTHER): Payer: BC Managed Care – PPO | Admitting: Family Medicine

## 2019-06-18 VITALS — Ht 65.0 in

## 2019-06-18 DIAGNOSIS — R197 Diarrhea, unspecified: Secondary | ICD-10-CM

## 2019-06-18 DIAGNOSIS — R350 Frequency of micturition: Secondary | ICD-10-CM | POA: Diagnosis not present

## 2019-06-18 DIAGNOSIS — Z20822 Contact with and (suspected) exposure to covid-19: Secondary | ICD-10-CM

## 2019-06-18 MED ORDER — ONDANSETRON HCL 4 MG PO TABS: 4.0000 mg | ORAL_TABLET | Freq: Three times a day (TID) | ORAL | 0 refills | Status: DC | PRN

## 2019-06-18 NOTE — Assessment & Plan Note (Signed)
Most likely viral GE.. send for coronavirus testing.  Symptomatic and supportive care.  Zofran prn.

## 2019-06-18 NOTE — Telephone Encounter (Signed)
Patient is requesting a call back. She had a virtual appointment today and add some additional questions she wanted to discuss

## 2019-06-18 NOTE — Progress Notes (Signed)
Work note written and faxed Attn: Lonzo Candy at [phone].

## 2019-06-18 NOTE — Progress Notes (Signed)
VIRTUAL VISIT Due to national recommendations of social distancing due to [place] 19, a virtual visit is felt to be most appropriate for this patient at this time.   I connected with the patient on 06/18/19 at  9:20 AM EDT by virtual telehealth platform and verified that I am speaking with the correct person using two identifiers.   I discussed the limitations, risks, security and privacy concerns of performing an evaluation and management service by  virtual telehealth platform and the availability of in person appointments. I also discussed with the patient that there may be a patient responsible charge related to this service. The patient expressed understanding and agreed to proceed.  Patient location: Home Provider Location: [facility] Participants: Eliezer Lofts and [name]   Chief Complaint  Patient presents with  . Head Injury  . Nausea  . Emesis  . Diarrhea    History of Present Illness: 33 year old female presents with new onset head ache, diarrhea, emesis  She had virtual OV yesterday  For CPX and she was doing well except for UTI being treated.  Started with abdominal bloating x 6 days. Increase urinary frequency, no dysuria, no urgency.  Treated by televisit for UTI with nitrofurantopin on 8/18. She felt a little better.. but continued abd bloating and pain.Marland Kitchen on rgiht flank. She now has nausea, vomiting and diarrhea...started this AM at 5. She has been able to keep down liquids.Marland Kitchen last emesis this 7:30 AM. BM soft not watery.  No fever.  No cough , no congestion.   collegue with viral GE symptoms earlier in week... 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  The importance of social distancing was discussed today.   Review of Systems  Constitutional: Negative for chills and fever.  HENT: Negative for congestion and ear pain.    Eyes: Negative for pain and redness.  Respiratory: Negative for cough and shortness of breath.   Cardiovascular: Negative for chest pain, palpitations and leg swelling.  Gastrointestinal: Negative for abdominal pain, blood in stool, constipation, diarrhea, nausea and vomiting.  Genitourinary: Negative for dysuria.  Musculoskeletal: Negative for falls and myalgias.  Skin: Negative for rash.  Neurological: Negative for dizziness.  Psychiatric/Behavioral: Negative for depression. The patient is not nervous/anxious.       Past Medical History:  Diagnosis Date  . Abnormal Pap smear of cervix   . Anxiety   . Dysmenorrhea   . GERD (gastroesophageal reflux disease)    just occasionally - diet controlled  . History of chicken pox   . Major depression   . PONV (postoperative nausea and vomiting)     reports that she quit smoking about 5 years ago. Her smoking use included cigarettes. She has a 1.00 pack-year smoking history. She has never used smokeless tobacco. She reports current alcohol use of about 3.0 - 4.0 standard drinks of alcohol per week. She reports that she does not use drugs.   Current Outpatient Medications:  .  escitalopram (LEXAPRO) 10 MG tablet, Take 1 tablet (10 mg total) by mouth daily., Disp: 30 tablet, Rfl: 0 .  nitrofurantoin, macrocrystal-monohydrate, (MACROBID) 100 MG capsule, Take 1 capsule by mouth 2 (two) times daily., Disp: , Rfl:    Observations/Objective: Height 5\' 5"  (1.651 m), last menstrual period 06/13/2019.  Physical Exam  Physical Exam Constitutional:  General: The patient is not in acute distress. Pulmonary:     Effort: Pulmonary effort is normal. No respiratory distress.  Neurological:     Mental Status: The patient is alert and oriented to person, place, and time.  Psychiatric:        Mood and Affect: Mood normal.        Behavior: Behavior normal.   Assessment and Plan   Acute diarrhea Most likely viral GE.. send for coronavirus  testing.  Symptomatic and supportive care.  Zofran prn.  Urinary frequency Started ion nitrofurantoin 4 days ago ( no SE to this med) for presumed UTI per televisit.  Urinary frequency resolved.. never had other urinary symptoms. Unclear if UTI true dx... now more with Viral GE symmptoms.   Does have CVA ttp... so if fever and urinary symptoms return or not keeping down fluids.Marland Kitchen go to Urgent care for eval and UA/Ucx. Pt voiced understanding. .    I discussed the assessment and treatment plan with the patient. The patient was provided an opportunity to ask questions and all were answered. The patient agreed with the plan and demonstrated an understanding of the instructions.   The patient was advised to call back or seek an in-person evaluation if the symptoms worsen or if the condition fails to improve as anticipated.     Eliezer Lofts, MD

## 2019-06-18 NOTE — Telephone Encounter (Signed)
[place] call back with work fax number.  Work note written and faxed Attn: Lonzo Candy at [phone] as instructed by Dr. Diona Browner.

## 2019-06-18 NOTE — Assessment & Plan Note (Signed)
Started ion nitrofurantoin 4 days ago ( no SE to this med) for presumed UTI per televisit.  Urinary frequency resolved.. never had other urinary symptoms. Unclear if UTI true dx... now more with Viral GE symmptoms.   Does have CVA ttp... so if fever and urinary symptoms return or not keeping down fluids.Paula Evans go to Urgent care for eval and UA/Ucx. Pt voiced understanding. Paula Evans

## 2019-06-18 NOTE — Patient Instructions (Addendum)
Rest fluids, can use zofran for n/V.  Got urgent care for urinary testing if fever or  Burning with urination or not improving as expected.  Go to [facility] for coronavirus testing. Complete nitrofurantoin course for possible UTI  Viral Gastroenteritis, Adult  Viral gastroenteritis is also known as the stomach flu. This condition may affect your stomach, small intestine, and large intestine. It can cause sudden watery diarrhea, fever, and vomiting. This condition is caused by many different viruses. These viruses can be passed from person to person very easily (are contagious). Diarrhea and vomiting can make you feel weak and cause you to become dehydrated. You may not be able to keep fluids down. Dehydration can make you tired and thirsty, cause you to have a dry mouth, and decrease how often you urinate. It is important to replace the fluids that you lose from diarrhea and vomiting. What are the causes? Gastroenteritis is caused by many viruses, including rotavirus and norovirus. Norovirus is the most common cause in adults. You can get sick after being exposed to the viruses from other people. You can also get sick by:  Eating food, drinking water, or touching a surface contaminated with one of these viruses.  Sharing utensils or other personal items with an infected person. What increases the risk? You are more likely to develop this condition if you:  Have a weak body defense system (immune system).  Live with one or more children who are younger than 29 years old.  Live in a nursing home.  Travel on cruise ships. What are the signs or symptoms? Symptoms of this condition start suddenly 1-3 days after exposure to a virus. Symptoms may last for a few days or for as long as a week. Common symptoms include watery diarrhea and vomiting. Other symptoms include:  Fever.  Headache.  Fatigue.  Pain in the abdomen.  Chills.  Weakness.  Nausea.  Muscle aches.  Loss of  appetite. How is this diagnosed? This condition is diagnosed with a medical history and physical exam. You may also have a stool test to check for viruses or other infections. How is this treated? This condition typically goes away on its own. The focus of treatment is to prevent dehydration and restore lost fluids (rehydration). This condition may be treated with:  An oral rehydration solution (ORS) to replace important salts and minerals (electrolytes) in your body. Take this if told by your health care provider. This is a drink that is sold at pharmacies and retail stores.  Medicines to help with your symptoms.  Probiotic supplements to reduce symptoms of diarrhea.  Fluids given through an IV, if dehydration is severe. Older adults and people with other diseases or a weak immune system are at higher risk for dehydration. Follow these instructions at home:  Eating and drinking   Take an ORS as told by your health care provider.  Drink clear fluids in small amounts as you are able. Clear fluids include: ? Water. ? Ice chips. ? Diluted fruit juice. ? Low-calorie sports drinks.  Drink enough fluid to keep your urine pale yellow.  Eat small amounts of healthy foods every 3-4 hours as you are able. This may include whole grains, fruits, vegetables, lean meats, and yogurt.  Avoid fluids that contain a lot of sugar or caffeine, such as energy drinks, sports drinks, and soda.  Avoid spicy or fatty foods.  Avoid alcohol. General instructions  Wash your hands often, especially after having diarrhea or vomiting. If  soap and water are not available, use hand sanitizer.  Make sure that all people in your household wash their hands well and often.  Take over-the-counter and prescription medicines only as told by your health care provider.  Rest at home while you recover.  Watch your condition for any changes.  Take a warm bath to relieve any burning or pain from frequent diarrhea  episodes.  Keep all follow-up visits as told by your health care provider. This is important. Contact a health care provider if you:  Cannot keep fluids down.  Have symptoms that get worse.  Have new symptoms.  Feel light-headed or dizzy.  Have muscle cramps. Get help right away if you:  Have chest pain.  Feel extremely weak or you faint.  See blood in your vomit.  Have vomit that looks like coffee grounds.  Have bloody or black stools or stools that look like tar.  Have a severe headache, a stiff neck, or both.  Have a rash.  Have severe pain, cramping, or bloating in your abdomen.  Have trouble breathing or you are breathing very quickly.  Have a fast heartbeat.  Have skin that feels cold and clammy.  Feel confused.  Have pain when you urinate.  Have signs of dehydration, such as: ? Dark urine, very little urine, or no urine. ? Cracked lips. ? Dry mouth. ? Sunken eyes. ? Sleepiness. ? Weakness. Summary  Viral gastroenteritis is also known as the stomach flu. It can cause sudden watery diarrhea, fever, and vomiting.  This condition can be passed from person to person very easily (is contagious).  Take an ORS if told by your health care provider. This is a drink that is sold at pharmacies and retail stores.  Wash your hands often, especially after having diarrhea or vomiting. If soap and water are not available, use hand sanitizer. This information is not intended to replace advice given to you by your health care provider. Make sure you discuss any questions you have with your health care provider. Document Released: 10/14/2005 Document Revised: 08/19/2018 Document Reviewed: 08/19/2018 Elsevier Patient Education  2020 [employer].

## 2019-06-19 LAB — NOVEL CORONAVIRUS, NAA: SARS-CoV-2, NAA: NOT DETECTED

## 2019-06-21 ENCOUNTER — Ambulatory Visit (INDEPENDENT_AMBULATORY_CARE_PROVIDER_SITE_OTHER): Payer: BC Managed Care – PPO | Admitting: Licensed Clinical Social Worker

## 2019-06-21 DIAGNOSIS — R4586 Emotional lability: Secondary | ICD-10-CM

## 2019-06-21 DIAGNOSIS — F411 Generalized anxiety disorder: Secondary | ICD-10-CM | POA: Diagnosis not present

## 2019-06-21 DIAGNOSIS — F332 Major depressive disorder, recurrent severe without psychotic features: Secondary | ICD-10-CM | POA: Diagnosis not present

## 2019-06-21 NOTE — Progress Notes (Signed)
Virtual Visit via Video Note  I connected with Paula Evans on 06/21/19 at  8:00 AM EDT by a video enabled telemedicine application and verified that I am speaking with the correct person using two identifiers.   I discussed the limitations of evaluation and management by telemedicine and the availability of in person appointments. The patient expressed understanding and agreed to proceed.   Follow Up Instructions:    I discussed the assessment and treatment plan with the patient. The patient was provided an opportunity to ask questions and all were answered. The patient agreed with the plan and demonstrated an understanding of the instructions.   The patient was advised to call back or seek an in-person evaluation if the symptoms worsen or if the condition fails to improve as anticipated.  I provided 55 minutes of non-face-to-face time during this encounter.   THERAPIST PROGRESS NOTE  Session Time: 8:02 AM to 8:57 AM  Participation Level: Active  Behavioral Response: CasualAlertappropriate  Type of Therapy: Individual Therapy  Treatment Goals addressed:  decrease depression, better communication, coping   Interventions: DBT, Solution Focused, Strength-based, Supportive and Other: coping  Summary: Paula Evans is a 33 y.o. female who presents with settling down for the most part, keep the past in the past and move forward. My anxiety has been pretty steady. The depression part of it depends. I have been home for four days. When nothing to keep my mind busy it starts getting to me. A little bit of everything, mind racing, everything that is going on now, at times think about the past or financial stuff. Related has been having trouble sleeping. Woke up and deep breathing helped her to fall back to sleep. Reviewed financial stressors. I bought a new mattress for the bed bug thing, I still need to pay for treatment of it. Struggling to pay bills and looking for another job. Somebody  who works for the same company as her is helping her look for another position.  Therapist introduced mindfulness and patient likes the [phone] exercise which is to identify 5 things that she can see, 4 things she can feel, 3 things she can hear, 1 thing you can taste, 1 thing you smell I like.  Discussed it will calm her down, help her think clearer.  Also helps as a relaxation strategy for anxiety.   Introduced distress tolerance (see below)  Reviewed self-care as needed for get for mental health symptoms.  Patient shares lately haven't done anything as far as being with people and and going out due to cornovirus. Always been secluded, not a crown person. Not been exercising. Exercise is related to mood. No motivation also to be honest lazy. Job  exhausting 10 hour days, when off relax. My house is clear and clear all the time. What does she do for fun? nothing working. Long day contributes to mood. For fun watches shows. A month since see significant other.  Therapist pointed out she needs self-care besides screen time.  Reviewed possibilities in patients that there is a pool where she lives and would enjoy this.   Reviewed session and patient's related like mindfulness and self-care as she realizes she has no self-care activities.  Reviewed ways to practice mindfulness and she has used headspace to help her fall back to sleep and then work.  Agreed that breathing exercises will be part of her mindfulness activities. .  Suicidal/Homicidal: No  Therapist Response: Therapist assessed patient current functioning per report and processed feelings  related to current stressors.  Address racing thoughts and issues with depression anxiety by explaining mindfulness through 2 worksheets. 1 titled "what is mindfulness".and "How to Practice Mindfulness".  Explained it is a state of nonjudgmental awareness of what is happening in the present moment, including the awareness of once thoughts, feelings and senses.   Awareness-during his stay to mindfulness you will notice your thoughts feelings and physical sensations as they happen.  The goal is not to clear your mind or stop thinking-it is to become aware of your thoughts and feelings rather than getting lost in them.  Acceptance-the thoughts feelings and sensations that you noticed should be observed in a nonjudgmental manner.  For example if you notice a feeling of nervousness simply stay to yourself "I noticed that I am feeling nervous" there is no need further judge or change the feeling. Reviewed benefits of mindfulness including reduce symptoms of depression anxiety, reduce ruminations, improved ability to adapt to stressful situations Reviewed different practices such as mindfulness meditation, mindfulness walk, paying attention of 5 symptoms that will help reduce benefits of mindfulness. Explained treatment of anxiety involves relaxation as 1 of its components and this practice will address physical symptoms and help decrease physical symptoms of anxiety. It will help create awareness of cognitions that cause depression anxiety, awareness will help you have more control over thoughts and emotions.  Reviewed patient will practice breathing exercises daily as mindfulness practice. Helps with sleep as well.  Introduced distress tolerance and explained that unhealthy coping skills such as thinking about past, worrying about possible future leads to more distress and reviewed causes missing work on things happening now and more depression and anxiety.  Reviewed distraction as distress tolerance as it can temporarily stop you from thinking about your pain and give you time to find appropriate coping response.  It is not ignoring the problem but returning when you are calm and better able to come up with healthy coping  Reviewed the patient has no self-care activities and significant for her to implement into her schedule. Provided strength based and supportive  intervention. Plan: Return again in 4 weeks.2.Patient will practice mindfulness and self-care activities.3.Therapist work with patient on mood regulation skills, coping  Diagnosis: Axis I:  major depressive disorder, recurrent, severe, generalized anxiety disorder, mood swings    Axis II: No diagnosis    Cordella Register, LCSW 06/21/2019

## 2019-06-22 ENCOUNTER — Ambulatory Visit (HOSPITAL_COMMUNITY): Payer: BC Managed Care – PPO | Admitting: Licensed Clinical Social Worker

## 2019-06-22 MED ORDER — FLUCONAZOLE 150 MG PO TABS: 150.0000 mg | ORAL_TABLET | Freq: Once | ORAL | 0 refills | Status: AC

## 2019-06-27 DIAGNOSIS — M79671 Pain in right foot: Secondary | ICD-10-CM | POA: Diagnosis not present

## 2019-07-06 ENCOUNTER — Other Ambulatory Visit: Payer: Self-pay

## 2019-07-06 ENCOUNTER — Encounter: Payer: Self-pay | Admitting: Family Medicine

## 2019-07-06 ENCOUNTER — Ambulatory Visit: Payer: BC Managed Care – PPO | Admitting: Family Medicine

## 2019-07-06 VITALS — BP 110/78 | HR 63 | Temp 99.4°F | Ht 65.0 in | Wt 192.0 lb

## 2019-07-06 DIAGNOSIS — M79671 Pain in right foot: Secondary | ICD-10-CM

## 2019-07-06 MED ORDER — DICLOFENAC SODIUM 75 MG PO TBEC: 75.0000 mg | DELAYED_RELEASE_TABLET | Freq: Two times a day (BID) | ORAL | 0 refills | Status: DC

## 2019-07-06 NOTE — Patient Instructions (Signed)
Ice, elevate, start home stretching for foot.  Stop indomethacin and change to diclofenac twice daily.  No standing  or walking for prolongued period of time.  If not improving as expected.. call for referral to podiatry.

## 2019-07-06 NOTE — Assessment & Plan Note (Signed)
ice, elevate, start home stretching for foot.  Stop indomethacin and change to diclofenac twice daily.  No standing  or walking for prolongued period of time.  If not improving as expected.. call for referral to podiatry.

## 2019-07-06 NOTE — Progress Notes (Signed)
Chief Complaint  Patient presents with  . Foot Swelling    Right Foot across top-painful    History of Present Illness: HPI      33 year old female presents for  New onset right foot pain and swelling.  She noted right foot pain x 2 weeks ago. Gradual onset.. no fall, no known injury.  Had recently changed to narrower shoes. Central dorsal foot pain.. into middle 3 toes.  Area is more swollen than the other.   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 Painful at rest and when up on feet.   Went to fast med about 4 days after pain started.  Xray: showed no broken bones. Uric acid was Given indomethacin.. did not help. She has tried tylenol, ice.. no relief.   No history of foot issues, no past foot surgeries.  COVID 19 screen No recent travel or known exposure to COVID19 The patient denies respiratory symptoms of COVID 19 at this time.  The importance of social distancing was discussed today.   Review of Systems  Constitutional: Negative for chills and fever.  HENT: Negative for congestion and ear pain.   Eyes: Negative for pain and redness.  Respiratory: Negative for cough and shortness of breath.   Cardiovascular: Negative for chest pain, palpitations and leg swelling.  Gastrointestinal: Negative for abdominal pain, blood in stool, constipation, diarrhea, nausea and vomiting.  Genitourinary: Negative for dysuria.  Musculoskeletal: Negative for falls and myalgias.  Skin: Negative for rash.  Neurological: Negative for dizziness.  Psychiatric/Behavioral: Negative for depression. The patient is not nervous/anxious.       Past Medical History:  Diagnosis Date  . Abnormal Pap smear of cervix   . Anxiety   . Dysmenorrhea   . GERD (gastroesophageal reflux disease)    just occasionally - diet controlled  . History of chicken pox   . Major depression   . PONV (postoperative nausea and vomiting)     reports that she quit smoking  about 5 years ago. Her smoking use included cigarettes. She has a 1.00 pack-year smoking history. She has never used smokeless tobacco. She reports current alcohol use of about 3.0 - 4.0 standard drinks of alcohol per week. She reports that she does not use drugs.   Current Outpatient Medications:  .  escitalopram (LEXAPRO) 10 MG tablet, Take 1 tablet (10 mg total) by mouth daily., Disp: 30 tablet, Rfl: 0   Observations/Objective: Blood pressure 110/78, pulse 63, temperature 99.4 F (37.4 C), temperature source Temporal, height 5\' 5"  (1.651 m), weight 192 lb (87.1 kg), last menstrual period 06/13/2019, SpO2 98 %.  Physical Exam Constitutional:      General: She is not in acute distress.    Appearance: Normal appearance. She is well-developed. She is not ill-appearing or toxic-appearing.  HENT:     Head: Normocephalic.     Right Ear: Hearing, tympanic membrane, ear canal and external ear normal. Tympanic membrane is not erythematous, retracted or bulging.     Left Ear: Hearing, tympanic membrane, ear canal and external ear normal. Tympanic membrane is not erythematous, retracted or bulging.     Nose: No mucosal edema or rhinorrhea.     Right Sinus: No maxillary sinus tenderness or frontal sinus tenderness.     Left Sinus: No maxillary sinus tenderness or frontal sinus tenderness.     Mouth/Throat:     Pharynx: Uvula midline.  Eyes:  General: Lids are normal. Lids are everted, no foreign bodies appreciated.     Conjunctiva/sclera: Conjunctivae normal.     Pupils: Pupils are equal, round, and reactive to light.  Neck:     Musculoskeletal: Normal range of motion and neck supple.     Thyroid: No thyroid mass or thyromegaly.     Vascular: No carotid bruit.     Trachea: Trachea normal.  Cardiovascular:     Rate and Rhythm: Normal rate and regular rhythm.     Pulses: Normal pulses.     Heart sounds: Normal heart sounds, S1 normal and S2 normal. No murmur. No friction rub. No gallop.    Pulmonary:     Effort: Pulmonary effort is normal. No tachypnea or respiratory distress.     Breath sounds: Normal breath sounds. No decreased breath sounds, wheezing, rhonchi or rales.  Abdominal:     General: Bowel sounds are normal.     Palpations: Abdomen is soft.     Tenderness: There is no abdominal tenderness.  Musculoskeletal:       Feet:  Feet:     Comments:  ttp over dorsal 2,3,4th metatarsal head AND BODY.. NO TTP OVER PLANTAR SURFACE. Skin:    General: Skin is warm and dry.     Findings: No rash.  Neurological:     Mental Status: She is alert.  Psychiatric:        Mood and Affect: Mood is not anxious or depressed.        Speech: Speech normal.        Behavior: Behavior normal. Behavior is cooperative.        Thought Content: Thought content normal.        Judgment: Judgment normal.      Assessment and Plan   Acute foot pain, right ice, elevate, start home stretching for foot.  Stop indomethacin and change to diclofenac twice daily.  No standing  or walking for prolongued period of time.  If not improving as expected.. call for referral to podiatry.      Eliezer Lofts, MD

## 2019-07-14 ENCOUNTER — Ambulatory Visit (INDEPENDENT_AMBULATORY_CARE_PROVIDER_SITE_OTHER): Payer: BC Managed Care – PPO | Admitting: Psychiatry

## 2019-07-14 ENCOUNTER — Other Ambulatory Visit: Payer: Self-pay

## 2019-07-14 ENCOUNTER — Encounter (HOSPITAL_COMMUNITY): Payer: Self-pay | Admitting: Psychiatry

## 2019-07-14 DIAGNOSIS — R4586 Emotional lability: Secondary | ICD-10-CM

## 2019-07-14 DIAGNOSIS — F332 Major depressive disorder, recurrent severe without psychotic features: Secondary | ICD-10-CM | POA: Diagnosis not present

## 2019-07-14 DIAGNOSIS — F411 Generalized anxiety disorder: Secondary | ICD-10-CM

## 2019-07-14 MED ORDER — ESCITALOPRAM OXALATE 10 MG PO TABS: 10.0000 mg | ORAL_TABLET | Freq: Every day | ORAL | 2 refills | Status: DC
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
## 2019-07-26 ENCOUNTER — Ambulatory Visit (INDEPENDENT_AMBULATORY_CARE_PROVIDER_SITE_OTHER): Payer: BC Managed Care – PPO | Admitting: Licensed Clinical Social Worker

## 2019-07-26 DIAGNOSIS — F411 Generalized anxiety disorder: Secondary | ICD-10-CM

## 2019-07-26 DIAGNOSIS — R4586 Emotional lability: Secondary | ICD-10-CM

## 2019-07-26 DIAGNOSIS — F332 Major depressive disorder, recurrent severe without psychotic features: Secondary | ICD-10-CM
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
## 2019-08-20 ENCOUNTER — Ambulatory Visit (INDEPENDENT_AMBULATORY_CARE_PROVIDER_SITE_OTHER): Payer: BC Managed Care – PPO | Admitting: Licensed Clinical Social Worker

## 2019-08-20 DIAGNOSIS — F332 Major depressive disorder, recurrent severe without psychotic features: Secondary | ICD-10-CM

## 2019-08-20 DIAGNOSIS — F411 Generalized anxiety disorder: Secondary | ICD-10-CM

## 2019-08-20 DIAGNOSIS — R4586 Emotional lability: Secondary | ICD-10-CM
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
## 2019-09-17 ENCOUNTER — Ambulatory Visit (INDEPENDENT_AMBULATORY_CARE_PROVIDER_SITE_OTHER): Payer: BC Managed Care – PPO | Admitting: Licensed Clinical Social Worker

## 2019-09-17 DIAGNOSIS — F411 Generalized anxiety disorder: Secondary | ICD-10-CM | POA: Diagnosis not present

## 2019-09-17 DIAGNOSIS — F332 Major depressive disorder, recurrent severe without psychotic features: Secondary | ICD-10-CM | POA: Diagnosis not present

## 2019-09-17 DIAGNOSIS — R4586 Emotional lability: Secondary | ICD-10-CM
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
## 2019-10-04 ENCOUNTER — Encounter (HOSPITAL_COMMUNITY): Payer: Self-pay | Admitting: Psychiatry

## 2019-10-04 ENCOUNTER — Ambulatory Visit (INDEPENDENT_AMBULATORY_CARE_PROVIDER_SITE_OTHER): Payer: BC Managed Care – PPO | Admitting: Psychiatry

## 2019-10-04 DIAGNOSIS — F411 Generalized anxiety disorder: Secondary | ICD-10-CM | POA: Diagnosis not present

## 2019-10-04 DIAGNOSIS — Z79899 Other long term (current) drug therapy: Secondary | ICD-10-CM

## 2019-10-04 DIAGNOSIS — R4586 Emotional lability: Secondary | ICD-10-CM

## 2019-10-04 DIAGNOSIS — F332 Major depressive disorder, recurrent severe without psychotic features: Secondary | ICD-10-CM

## 2019-10-04 DIAGNOSIS — F331 Major depressive disorder, recurrent, moderate: Secondary | ICD-10-CM | POA: Diagnosis not present

## 2019-10-04 MED ORDER — ESCITALOPRAM OXALATE 10 MG PO TABS: 10.0000 mg | ORAL_TABLET | Freq: Every day | ORAL | 2 refills | Status: DC

## 2019-10-04 NOTE — Progress Notes (Signed)
[facility] Follow up visit   Patient Identification: Paula Evans MRN:  [id number] Date of Evaluation:  10/04/2019 Referral Source: Therapist and primary care Chief Complaint:   Depression follow up   I connected with [name] on 10/04/19 at  1:30 PM EST by a video enabled telemedicine application and verified that I am speaking with the correct person using two identifiers.       I discussed the limitations of evaluation and management by telemedicine and the availability of in person appointments. The patient expressed understanding and agreed to proceed.Visit Diagnosis:    ICD-10-CM   1. Severe episode of recurrent major depressive disorder, without psychotic features ([place])  F33.2   2. Mood swings  R45.86   3. GAD (generalized anxiety disorder)  F41.1     History of Present Illness: Patient is a 33 years old single African-American female.  Has had a difficult year with the pandemic and multiple relocation or move into apartments. Past breakups  Now found another job, liking it, travelling Less stressed Minerva and sister is support system Working in therapy and is helpful   Aggravating factors; relationship concerns in the past.  Multiple moves,   Modifying factor mom, sibling sister, current job less stressful  Duration since 2011.  Hospital admission for depression 2017   Severity imprvoed      Past Psychiatric History: depression   Past Medical History:  Past Medical History:  Diagnosis Date  . Abnormal Pap smear of cervix   . Anxiety   . Dysmenorrhea   . GERD (gastroesophageal reflux disease)    just occasionally - diet controlled  . History of chicken pox   . Major depression   . PONV (postoperative nausea and vomiting)     Past Surgical History:  Procedure Laterality Date  . CERVICAL BIOPSY  W/ LOOP ELECTRODE EXCISION    . COLPOSCOPY    . ROBOT ASSISTED MYOMECTOMY N/A 04/02/2017   Procedure: ROBOTIC ASSISTED MYOMECTOMY with  Morcellation;  Surgeon: Delsa Bern, MD;  Location: [facility];  Service: Gynecology;  Laterality: N/A;  . WISDOM TOOTH EXTRACTION  2008    Family Psychiatric History: dad: depression, ptsd, alcohol use  Family History:  Family History  Problem Relation Age of Onset  . Hyperlipidemia Mother   . Alcohol abuse Father   . Post-traumatic stress disorder Father   . Drug abuse Father   . Lung cancer Paternal Uncle     Social History:   Social History   Socioeconomic History  . Marital status: Single    Spouse name: Not on file  . Number of children: Not on file  . Years of education: Not on file  . Highest education level: Not on file  Occupational History  . Occupation: International aid/development worker: [employer]  . Financial resource strain: Not on file  . Food insecurity    Worry: Not on file    Inability: Not on file  . Transportation needs    Medical: Not on file    Non-medical: Not on file  Tobacco Use  . Smoking status: Former Smoker    Packs/day: 0.25    Years: 4.00    Pack years: 1.00    Types: Cigarettes    Quit date: 10/28/2013    Years since quitting: 5.9  . Smokeless tobacco: Never Used  Substance and Sexual Activity  . Alcohol use: Yes    Alcohol/week: 3.0 - 4.0 standard drinks  Types: 1 - 2 Cans of beer, 2 Standard drinks or equivalent per week  . Drug use: No  . Sexual activity: Not Currently    Partners: Female    Birth control/protection: None  Lifestyle  . Physical activity    Days per week: Not on file    Minutes per session: Not on file  . Stress: Not on file  Relationships  . Social Herbalist on phone: Not on file    Gets together: Not on file    Attends religious service: Not on file    Active member of club or organization: Not on file    Attends meetings of clubs or organizations: Not on file    Relationship status: Not on file  Other Topics Concern  . Not on file  Social History Narrative   Diet: junk food, water,  some fruits and veggies, minimum calcium.    Allergies:  No Known Allergies  Metabolic Disorder Labs: No results found for: HGBA1C, MPG No results found for: PROLACTIN Lab Results  Component Value Date   CHOL 154 05/17/2019   TRIG 101.0 05/17/2019   HDL 52.00 05/17/2019   CHOLHDL 3 05/17/2019   VLDL 20.2 05/17/2019   LDLCALC 81 05/17/2019   LDLCALC 99 01/16/2018   No results found for: TSH  Therapeutic Level Labs: No results found for: LITHIUM No results found for: CBMZ No results found for: VALPROATE  Current Medications: Current Outpatient Medications  Medication Sig Dispense Refill  . diclofenac (VOLTAREN) 75 MG EC tablet Take 1 tablet (75 mg total) by mouth 2 (two) times daily. 30 tablet 0  . escitalopram (LEXAPRO) 10 MG tablet Take 1 tablet (10 mg total) by mouth daily. 30 tablet 2   No current facility-administered medications for this visit.      Psychiatric Specialty Exam: Review of Systems  Cardiovascular: Negative for chest pain.  Skin: Negative for rash.  Psychiatric/Behavioral: Negative for substance abuse and suicidal ideas.    There were no vitals taken for this visit.There is no height or weight on file to calculate BMI.  General Appearance: Casual  Eye Contact:  Fair  Speech:  Slow  Volume:  Normal  Mood:  better  Affect:  Congruent  Thought Process:  Goal Directed  Orientation:  Full (Time, Place, and Person)  Thought Content:  Logical  Suicidal Thoughts:  No  Homicidal Thoughts:  No  Memory:  Immediate;   Fair Recent;   Fair  Judgement:  Fair  Insight:  Fair  Psychomotor Activity:  Normal  Concentration:  Concentration: Fair and Attention Span: Fair  Recall:  [employer] of Knowledge:Good  Language: Good  Akathisia:  No  Handed:  Right  AIMS (if indicated):  not done  Assets:  Desire for Improvement Physical Health  ADL's:  Intact  Cognition: WNL  Sleep:  Fair   Screenings: GAD-7     Office Visit from 06/17/2019 in [place] at [facility]  Total GAD-7 Score  10    PHQ2-9     Office Visit from 06/17/2019 in [place] at [place] from 01/20/2018 in [place] at [facility] from 01/16/2017 in [facility] from 05/31/2016 in [place] at [facility] Total Score  1  0  3  1  PHQ-9 Total Score  4  -  -  10      Assessment and Plan: as follows MDD moderate: recurrent  better, continue lexapro and therapy   GAD: improved. Continue lexapro Mood symptoms may be part of depression . No clear manic symptoms in past.  Continue to work in therapy  I discussed the assessment and treatment plan with the patient. The patient was provided an opportunity to ask questions and all were answered. The patient agreed with the plan and demonstrated an understanding of the instructions.   The patient was advised to call back or seek an in-person evaluation if the symptoms worsen or if the condition fails to improve as anticipated. Fu 31m.  Merian Capron, MD 12/7/20201:35 PM

## 2019-10-13 ENCOUNTER — Ambulatory Visit (HOSPITAL_COMMUNITY): Payer: BC Managed Care – PPO | Admitting: Psychiatry

## 2019-10-20 ENCOUNTER — Ambulatory Visit (INDEPENDENT_AMBULATORY_CARE_PROVIDER_SITE_OTHER): Payer: BC Managed Care – PPO | Admitting: Licensed Clinical Social Worker

## 2019-10-20 DIAGNOSIS — F332 Major depressive disorder, recurrent severe without psychotic features: Secondary | ICD-10-CM

## 2019-10-20 DIAGNOSIS — R4586 Emotional lability: Secondary | ICD-10-CM

## 2019-10-20 DIAGNOSIS — F411 Generalized anxiety disorder: Secondary | ICD-10-CM | POA: Diagnosis not present

## 2019-10-20 NOTE — Progress Notes (Signed)
Virtual Visit via Video Note  I connected with Paula Evans on 10/20/19 at  9:00 AM EST by a video enabled telemedicine application and verified that I am speaking with the correct person using two identifiers.   I discussed the limitations of evaluation and management by telemedicine and the availability of in person appointments. The patient expressed understanding and agreed to proceed.  I discussed the assessment and treatment plan with the patient. The patient was provided an opportunity to ask questions and all were answered. The patient agreed with the plan and demonstrated an understanding of the instructions.   The patient was advised to call back or seek an in-person evaluation if the symptoms worsen or if the condition fails to improve as anticipated.  I provided 54 minutes of non-face-to-face time during this encounter.  THERAPIST PROGRESS NOTE  Session Time: 9:01 AM to 9:55 AM  Participation Level: Active  Behavioral Response: CasualAlertEuthymic  Type of Therapy: Individual Therapy  Treatment Goals addressed: decrease in anxiety, work on Armed forces logistics/support/administrative officer. coping  Interventions: CBT, Solution Focused, Strength-based, Supportive and Other: coping  Summary: Paula Evans is a 33 y.o. female who presents with started new job. "I like it but learned a lot of lessons from the trip. Thinks the new job will be a good experience." Good for career and personal development. I am not a fan of flying. Facing fears now a little bit at a time. Shared when in plane got window seat and looked out and took pictures. Reviewed this as exposure therapy or getting used to something helps decrease anxiety. Related with her job offers positive experiences of being exposed to new places she can enjoy. How it enhances life and broadens your world. Reviewed it brings challenges as well. The challenges of going to a new place and never been there feel your way around, having to meet new people, not  knowing anyone. 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 "I do feel happy and upbeat. This last account was exhausting and I felt myself getting frustrated with plan that was poorly put together. It all came together." Good part she can see is to go into accounts and see problems they have, she can help but once the account is closed they are no longer hers. Feeling pretty good excited about work go to new places and learn a bunch of new stuff that has been my focus and with it personal growth. I do get home sick when not here. Face time my sister and sees cat and that helps." One thing that worries me is where will end up next." This position is not a permanent position. She will have a job but not sure where it will be. Worked on helpful worry as opposed to unhelpful worry and challenged her cognitive distortions. Reviewed session and patient shares was helpful to share new experiences with new job and realizing that thoughts are just thoughts and not really happening. Completed treatment plan  Suicidal/Homicidal: No  Therapist Response: Therapist reviewed symptoms, discussed stressors, facilitated expression of thoughts and feelings.  Guided patient in looking how mood is change and reasons for change.  Reviewed realizing that things do not stand still but change and improve and looking at what patient put in place of a new job offering a lot of opportunities plays a significant part of change of mood and positive mood.  Reviewed what we put in our lives can have significant impact on her mental health and wellbeing, this is empowering since we have the power to do this for ourselves in our lives.  Completed treatment plan patient gave verbal consent to complete virtually.  Reviewed exposure type interventions as helpful for patient's anxiety related to specific situation of traveling on plane.   Reviewed how travel and seeing new places will open patient's world and provide growth.  Provided psychoeducation on automatic thoughts, CBT education on thoughts that produce anxiety are future focused, predicting the worst case scenario and that a person underestimates their ability to cope.  Worked on challenging patients anxiety producing thoughts that are fortune telling about the future that has not happened.  Urged patient to channel energy to real problem worries and not hypothetical worries, helping patient understand hypothetical worries or not helpful to solving problems.  Also reviewed that mindfulness will help patient be more present and not be in her head in the future involved in something that has not happened yet.  Provided strength based and supportive intervention.  Reviewed different strategies she can use to capture automatic thoughts;  to slow down the action (as if watching a movie in slow motion) or look at what the situation means. Sometimes writing down the most distressing thoughts helps a patient remember his or her thoughts.  Provided strength based and supportive intervention.  Plan: Return again in 5 weeks.2.Work on Radiographer, therapeutic for anxiety, coping of stressors, communication skills  Diagnosis: Axis I:  major depressive disorder, recurrent, severe, generalized anxiety disorder, mood swings    Axis II: No diagnosis    Cordella Register, LCSW 10/20/2019

## 2019-10-25 ENCOUNTER — Ambulatory Visit (HOSPITAL_COMMUNITY): Payer: BC Managed Care – PPO | Admitting: Licensed Clinical Social Worker

## 2019-11-02 DIAGNOSIS — Z20828 Contact with and (suspected) exposure to other viral communicable diseases: Secondary | ICD-10-CM | POA: Diagnosis not present

## 2019-11-02 DIAGNOSIS — J029 Acute pharyngitis, unspecified: Secondary | ICD-10-CM | POA: Diagnosis not present

## 2019-11-02 DIAGNOSIS — R05 Cough: Secondary | ICD-10-CM | POA: Diagnosis not present

## 2019-11-02 DIAGNOSIS — Z03818 Encounter for observation for suspected exposure to other biological agents ruled out: Secondary | ICD-10-CM | POA: Diagnosis not present

## 2019-12-01 ENCOUNTER — Other Ambulatory Visit: Payer: Self-pay

## 2019-12-01 ENCOUNTER — Ambulatory Visit (INDEPENDENT_AMBULATORY_CARE_PROVIDER_SITE_OTHER): Payer: BC Managed Care – PPO | Admitting: Licensed Clinical Social Worker

## 2019-12-01 DIAGNOSIS — F332 Major depressive disorder, recurrent severe without psychotic features: Secondary | ICD-10-CM

## 2019-12-01 DIAGNOSIS — R4586 Emotional lability: Secondary | ICD-10-CM | POA: Diagnosis not present

## 2019-12-01 DIAGNOSIS — F411 Generalized anxiety disorder: Secondary | ICD-10-CM | POA: Diagnosis not present

## 2019-12-01 NOTE — Progress Notes (Signed)
Virtual Visit via Video Note  I connected with Paula Evans on 12/01/19 at  9:00 AM EST by a video enabled telemedicine application and verified that I am speaking with the correct person using two identifiers.   I discussed the limitations of evaluation and management by telemedicine and the availability of in person appointments. The patient expressed understanding and agreed to proceed.   I discussed the assessment and treatment plan with the patient. The patient was provided an opportunity to ask questions and all were answered. The patient agreed with the plan and demonstrated an understanding of the instructions.   The patient was advised to call back or seek an in-person evaluation if the symptoms worsen or if the condition fails to improve as anticipated.  I provided 55 minutes of non-face-to-face time during this encounter.  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 female who presents with recognizing situation will provide professional and career growth. Shares right now a little homesick, miss mom, sister, cat, bed. Try to get out and explore. Anxiety piece hasn't been bad, flying doesn't bother her as much as used to be. A little enjoyable. Therapist provided education on Generalized Anxiety and patient endorses depending what is going on in the day. At a new place and anxious first time coming here now realizes nothing to be anxious about. Discussed that it is normal to have a little anxiety with things that are new. Depression flaring up a little bit. Found herself  wanting to sleep a lot. Get  irritable very quick. Thinking about everything that happened last year. There are periods where think about things but right now maybe because away from home and only working so thinking about it. Think most of it right now is stemming from where apartment is located. It is two three miles from ex. Lease up in June. In limbo whether keep apartment or put storage and move in with mom, or move somewhere completely different. Therapist reviewed pros and cons of the different decisions. Love where live but when go somewhere faced with am I going to see her today. Economically moving in with mom makes sense but  Living on her own loves independence go home and do whatever.  Reviewed likelihood of running into ask and she related saw her best friend. Doesn't know how it is going to be, thinks about different scenarios. "These questons don't really matter but you know how the brain works." She is ready for it to be out of her mind.  Therapist reviewed as she continues to reprocess she will start to develop new narrative that will help her distance herself from this relationship.  Also encourage patient to wrap herself in on activities if she does decide to move back to apartment a strategy should be working on developing her life and putting her focus there so her mind stays occupied and also feels good with activities.    Suicidal/Homicidal: No  Therapist Response: Therapist reviewed symptoms, discussed stressors, facilitated expression of thoughts and feelings.  Continue to point out to patient her willingness to open herself to new experiences, travel will help with personal growth helps with self-esteem and mood.  Reviewed strategies for  anxiety such as when she gets on the airplane the more she does something the more she realizes that the situation was not as fearful as she thought it was also pointing out when we opened herself to experiences we find we have amazing experience this if we do not let fear hold [place]  back.  Reviewed concept of uncertainty in life and becoming comfortable with that and recognizing the risk is not as severe as we think, focusing on what we control.  Reviewed education on generalized anxiety disorder in general that we feel that we are at high alert, questioning this to realize no need to be on high alert that it is missed filtering of the brain.  Reviewed patient thinking about the past and therapist explained brain off and needs to keep reprocessing it to its work through it to be able to move on natural process with significant past events.  Courage patient only to give herself certain amount of time to those thoughts because she does not want to miss out on her own life with being wrapped up in the past.  Reviewed pros and cons of moving from apartment with her concerns to will run into her ex. reviewed when reprocessing it she will begin to have alternative narrative of it, as we reprocess in session learning more deeper meaning of the event, learning growth from the event, alternative narrative that is as true that we will be deeper, richer.  Was pointed out some alternative narratives such as not wanting people who have such character flaws to be part of her life's, not wanting to give them are time that they are not worth it.  Therapist provided strength based and supportive intervention  Plan: Return again in 4 weeks.2. Continue to process through issues from the past, depression and anxiety, coping  Diagnosis: Axis I:  major depressive disorder, recurrent, severe, generalized anxiety disorder, mood swings    Axis II: No diagnosis    Cordella Register, LCSW 12/01/2019

## 2019-12-22 ENCOUNTER — Encounter: Payer: Self-pay | Admitting: Family Medicine

## 2019-12-22 ENCOUNTER — Other Ambulatory Visit: Payer: Self-pay

## 2019-12-22 ENCOUNTER — Ambulatory Visit (INDEPENDENT_AMBULATORY_CARE_PROVIDER_SITE_OTHER): Payer: BC Managed Care – PPO | Admitting: Family Medicine

## 2019-12-22 VITALS — BP 106/80 | HR 63 | Temp 97.8°F | Ht 65.0 in | Wt 213.0 lb

## 2019-12-22 DIAGNOSIS — R140 Abdominal distension (gaseous): Secondary | ICD-10-CM

## 2019-12-22 DIAGNOSIS — K5900 Constipation, unspecified: Secondary | ICD-10-CM | POA: Diagnosis not present

## 2019-12-22 DIAGNOSIS — K219 Gastro-esophageal reflux disease without esophagitis: Secondary | ICD-10-CM | POA: Diagnosis not present

## 2019-12-22 MED ORDER — FAMOTIDINE 20 MG PO TABS: 20.0000 mg | ORAL_TABLET | Freq: Two times a day (BID) | ORAL | 1 refills | Status: DC

## 2019-12-22 NOTE — Assessment & Plan Note (Signed)
Suspect pt likely has IBS constipation predominant (she has bloating/gas and occ loose stool as well) Poor diet lately from travel and work  Enc more fluids and fruits/veg whenever possible Recommend citrucel daily  Also trial of probiotic for 2 wk otc  May need to keep a food diary  inst to watch for blood in stool or other changes Consider imaging or GI input if no imp

## 2019-12-22 NOTE — Progress Notes (Signed)
Subjective:    Patient ID: Paula Evans, female    DOB: 04/19/1986, 34 y.o.   MRN: [id number]  HPI 34 yo pt of Dr Welton Flakes Readings from Last 3 Encounters:  12/22/19 213 lb (96.6 kg)  07/06/19 192 lb (87.1 kg)  06/17/19 195 lb (88.5 kg)   35.45 kg/m    Having abdominal bloating and stool changes   She travels for work - just came back from [place] this week  Feels very bloated  Does not feel like she empties bowels all the way  (in between loose and hard and no blood and no black stool unless pepto)  Feels full w/o eating  At other times stomach feels empty/hollow   No n/v   No chance of pregnancy   Bad heartburn- (has GERD but this is worse than before)  Ate chinese food yesterday- made it worse   She took otc prilosec for 2 weeks - helped only a little   Eats on the run because she travels  Eats out all the time  Does not drink enough or get enough fruits and vegetables   No probiotics or laxatives   Some pain occasionally in stomach = a sharp pain in low abdomen  They grab her -fleeting No upper abd pain   GM had a partial colectomy (? Maybe cancer)   Patient Active Problem List   Diagnosis Date Noted  . Bloating 12/22/2019  . Acute foot pain, right 07/06/2019  . Urinary frequency 06/18/2019  . UTI (urinary tract infection) 06/17/2019  . Cough 11/15/2018  . Obesity (BMI 30-39.9) 01/20/2018  . Fibroids, intramural 04/02/2017  . Generalized anxiety disorder 06/14/2016  . Involuntary commitment 06/13/2016  . CIN II (cervical intraepithelial neoplasia II) 05/09/2015  . Lumbar back pain with radiculopathy affecting right lower extremity 11/08/2014  . Constipation 11/08/2014  . Pap smear abnormality of cervix with ASCUS favoring dysplasia 09/08/2014  . Carpal tunnel syndrome, left 07/12/2014  . ABNORMAL GLANDULAR PAPANICOLAOU SMEAR OF CERVIX 08/16/2010  . Moderate depressed bipolar I disorder ([facility]) 09/10/2007  . GERD 09/10/2007  . Severe episode of  recurrent major depressive disorder, without psychotic features ([facility]) 06/22/2007   Past Medical History:  Diagnosis Date  . Abnormal Pap smear of cervix   . Anxiety   . Dysmenorrhea   . GERD (gastroesophageal reflux disease)    just occasionally - diet controlled  . History of chicken pox   . Major depression   . PONV (postoperative nausea and vomiting)    Past Surgical History:  Procedure Laterality Date  . CERVICAL BIOPSY  W/ LOOP ELECTRODE EXCISION    . COLPOSCOPY    . ROBOT ASSISTED MYOMECTOMY N/A 04/02/2017   Procedure: ROBOTIC ASSISTED MYOMECTOMY with [place];  Surgeon: Delsa Bern, MD;  Location: [facility];  Service: Gynecology;  Laterality: N/A;  . WISDOM TOOTH EXTRACTION  2008   Social History   Tobacco Use  . Smoking status: Former Smoker    Packs/day: 0.25    Years: 4.00    Pack years: 1.00    Types: Cigarettes    Quit date: 10/28/2013    Years since quitting: 6.1  . Smokeless tobacco: Never Used  Substance Use Topics  . Alcohol use: Yes    Alcohol/week: 3.0 - 4.0 standard drinks    Types: 1 - 2 Cans of beer, 2 Standard drinks or equivalent per week  . Drug use: No   Family History  Problem Relation Age of Onset  .  Hyperlipidemia Mother   . Alcohol abuse Father   . Post-traumatic stress disorder Father   . Drug abuse Father   . Lung cancer Paternal Uncle    No Known Allergies Current Outpatient Medications on File Prior to Visit  Medication Sig Dispense Refill  . Cyanocobalamin (B-12) 500 MCG TABS     . escitalopram (LEXAPRO) 10 MG tablet Take 1 tablet (10 mg total) by mouth daily. 30 tablet 2  . Multiple Vitamins-Minerals (WOMENS MULTI VITAMIN & MINERAL) TABS      No current facility-administered medications on file prior to visit.     Review of Systems  Constitutional: Negative for activity change, appetite change, fatigue, fever and unexpected weight change.  HENT: Negative for congestion, ear pain, rhinorrhea, sinus pressure and sore throat.    Eyes: Negative for pain, redness and visual disturbance.  Respiratory: Negative for cough, shortness of breath and wheezing.   Cardiovascular: Negative for chest pain and palpitations.  Gastrointestinal: Positive for abdominal distention, abdominal pain and constipation. Negative for anal bleeding, blood in stool, diarrhea, nausea, rectal pain and vomiting.  Endocrine: Negative for polydipsia and polyuria.  Genitourinary: Negative for dysuria, frequency and urgency.  Musculoskeletal: Negative for arthralgias, back pain and myalgias.  Skin: Negative for pallor and rash.  Allergic/Immunologic: Negative for environmental allergies.  Neurological: Negative for dizziness, syncope and headaches.  Hematological: Negative for adenopathy. Does not bruise/bleed easily.  Psychiatric/Behavioral: Negative for decreased concentration and dysphoric mood. The patient is not nervous/anxious.        Objective:   Physical Exam Constitutional:      General: She is not in acute distress.    Appearance: Normal appearance. She is well-developed. She is obese. She is not ill-appearing.  HENT:     Head: Normocephalic and atraumatic.     Mouth/Throat:     Mouth: Mucous membranes are moist.  Eyes:     General: No scleral icterus.    Conjunctiva/sclera: Conjunctivae normal.     Pupils: Pupils are equal, round, and reactive to light.  Cardiovascular:     Rate and Rhythm: Normal rate and regular rhythm.     Heart sounds: Normal heart sounds.  Pulmonary:     Effort: Pulmonary effort is normal. No respiratory distress.     Breath sounds: Normal breath sounds. No wheezing or rales.  Abdominal:     General: Abdomen is protuberant. Bowel sounds are normal. There is no distension.     Palpations: Abdomen is soft. There is no hepatomegaly, splenomegaly, mass or pulsatile mass.     Tenderness: There is abdominal tenderness in the left lower quadrant. There is no right CVA tenderness, left CVA tenderness, guarding  or rebound. Negative signs include Murphy's sign and McBurney's sign.     Hernia: No hernia is present.     Comments: Very slight tenderness in LLQ to deep palpation  No M noted   Musculoskeletal:     Cervical back: Normal range of motion and neck supple.  Lymphadenopathy:     Cervical: No cervical adenopathy.  Skin:    General: Skin is warm and dry.     Coloration: Skin is not jaundiced or pale.     Findings: No erythema or rash.  Neurological:     Mental Status: She is alert.  Psychiatric:        Mood and Affect: Mood normal.           Assessment & Plan:   Problem List Items Addressed This Visit  Digestive   GERD - Primary    More heartburn lately with bloating  Travels more for work-not always eating right  Given  Handout re: diet for GERD Px pepcid 20 mg bid  inst to update in 1-2 wk if no imp  Enc to avoid nsaids       Relevant Medications   famotidine (PEPCID) 20 MG tablet     Other   Constipation    Suspect pt likely has IBS constipation predominant (she has bloating/gas and occ loose stool as well) Poor diet lately from travel and work  Enc more fluids and fruits/veg whenever possible Recommend citrucel daily  Also trial of probiotic for 2 wk otc  May need to keep a food diary  inst to watch for blood in stool or other changes Consider imaging or GI input if no imp      Bloating    This may be assoc with GERD and IBS -both of which are worse  See A/P for those  Also adv to avoid carbonation/swallowing air

## 2019-12-22 NOTE — Assessment & Plan Note (Signed)
More heartburn lately with bloating  Paula Evans more for work-not always eating right  Given  Handout re: diet for GERD Px pepcid 20 mg bid  inst to update in 1-2 wk if no imp  Enc to avoid nsaids

## 2019-12-22 NOTE — Patient Instructions (Addendum)
Drink water whenever you can Eat produce whenever you can  Exercise when you can as well   Get citrucel fiber supplement and take daily with fluid as directed  Get a probiotic like align over the counter- take daily for 2 weeks   Weight loss helps acid reflux   Start pepcid 20 mg twice daily for acid reflux  Let us know in 2 weeks if not improving  Watch diet as well

## 2019-12-22 NOTE — Assessment & Plan Note (Signed)
This may be assoc with GERD and IBS -both of which are worse  See A/P for those  Also adv to avoid carbonation/swallowing air

## 2019-12-26 ENCOUNTER — Other Ambulatory Visit (HOSPITAL_COMMUNITY): Payer: Self-pay | Admitting: Psychiatry

## 2019-12-29 ENCOUNTER — Ambulatory Visit (INDEPENDENT_AMBULATORY_CARE_PROVIDER_SITE_OTHER): Payer: BC Managed Care – PPO | Admitting: Licensed Clinical Social Worker

## 2019-12-29 DIAGNOSIS — R4586 Emotional lability: Secondary | ICD-10-CM | POA: Diagnosis not present

## 2019-12-29 DIAGNOSIS — F411 Generalized anxiety disorder: Secondary | ICD-10-CM

## 2019-12-29 DIAGNOSIS — F332 Major depressive disorder, recurrent severe without psychotic features: Secondary | ICD-10-CM | POA: Diagnosis not present

## 2019-12-29 NOTE — Progress Notes (Signed)
Virtual Visit via Video Note  I connected with Paula Evans on 12/29/19 at  9:00 AM EST by a video enabled telemedicine application and verified that I am speaking with the correct person using two identifiers.   I discussed the limitations of evaluation and management by telemedicine and the availability of in person appointments. The patient expressed understanding and agreed to proceed.   I discussed the assessment and treatment plan with the patient. The patient was provided an opportunity to ask questions and all were answered. The patient agreed with the plan and demonstrated an understanding of the instructions.   The patient was advised to call back or seek an in-person evaluation if the symptoms worsen or if the condition fails to improve as anticipated.  I provided 55 minutes of non-face-to-face time during this encounter.  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 female who presents with job easy but not a fan of not being away from family. Terrible without sister and mom. Being away is something can get used to. This is all very new being around them constantly to every too weeks is kind of hard. Thinks mood have been ok. Have been emotional day before yesterday, it was tough. Though about things growing up. Dad alcoholic being on drugs. Circumstances of leaving without having anything to eat. Talks about it helps in the moment helps in the long run it always comes back. Needs something that will help it decrease in long run.  Patient open to being referred to  specialist for trauma treatment therapist provided her with therapist to do EMDR.  Introduced patient to treatment for trauma.  Reviewed grounding techniques and patient related helpful technique of look and listening what is around her, her surroundings asking herself where am I now, I am safe now.  Reviewed relaxation strategies with patient as beginning part of trauma treatment patient related has found when in the airplane and anxiety spikes a little she closes her eyes,  breaths notices anxiety goes down.  Therapist introduced and reviewed relaxation strategies of deep breathing and mindfulness and encouraged regular practice as part of treatment strategy for trauma treatment.  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  Our brains don't process the experience into a memory, so the experience stays as a current problem instead of becoming a memory of a past event.  Each time we are reminded of the event, the 'flashbacks' mean we experience the trauma again, as though it is happening again, right now.  That is very distressing, so we do our utmost to stop the flashback, and avoid any further reminder of the event, so the event remains un-processed.  Explain trauma work will involve not avoiding the memory but reviewing it so that it can be stored, D habituating to the memory.  Explained grounding and provided grounding techniques that can be helpful to patient as she has flashback and will be used in treatment to help her with anxiety is too extreme.  Reviewed somatic interventions that are important piece of trauma  interventions such as deep breathing, mindfulness exercises that impact parts of the brain helpful for decrease of symptoms, help decrease fighter flight that is part of trauma.  Discussed patient doing regular practice to help with symptoms.  Provided referrals for specialist in treating trauma for EMDR.  Therapist assessed patient making progress on goals of working on trauma as treatment intervention was education on trauma, also learning techniques to decrease trauma symptoms.  Provided strength based and supportive intervention.  Therapist also provided education of mindfulness as emotional regulation strategy and self awareness is key to emotional regulation and mindfulness practice helps her be aware of thoughts feelings to help her with management of thoughts and feelings. Plan: Return again in 3 weeks.2.  Therapist continue to work on trauma with patient, anxiety, medication skills, coping 3.  Patient will look into referrals for EMDR  Diagnosis: Axis I:  major depressive disorder, recurrent, severe, generalized anxiety disorder, mood swings    Axis II: No diagnosis    Paula Register, LCSW 12/29/2019

## 2020-01-03 ENCOUNTER — Encounter (HOSPITAL_COMMUNITY): Payer: Self-pay | Admitting: Psychiatry

## 2020-01-03 ENCOUNTER — Ambulatory Visit (INDEPENDENT_AMBULATORY_CARE_PROVIDER_SITE_OTHER): Payer: BC Managed Care – PPO | Admitting: Psychiatry

## 2020-01-03 DIAGNOSIS — F411 Generalized anxiety disorder: Secondary | ICD-10-CM

## 2020-01-03 DIAGNOSIS — F332 Major depressive disorder, recurrent severe without psychotic features: Secondary | ICD-10-CM

## 2020-01-03 DIAGNOSIS — F331 Major depressive disorder, recurrent, moderate: Secondary | ICD-10-CM | POA: Diagnosis not present

## 2020-01-03 NOTE — Progress Notes (Signed)
[facility] Follow up visit   Patient Identification: Paula Evans MRN:  [id number] Date of Evaluation:  01/03/2020 Referral Source: Therapist and primary care Chief Complaint:   Depression follow up   I connected with [name] on 01/03/20 at  3:00 PM EST by a video enabled telemedicine application and verified that I am speaking with the correct person using two identifiers.      I discussed the limitations of evaluation and management by telemedicine and the availability of in person appointments. The patient expressed understanding and agreed to proceed. Visit Diagnosis:    ICD-10-CM   1. Severe episode of recurrent major depressive disorder, without psychotic features ([place])  F33.2   2. GAD (generalized anxiety disorder)  F41.1     History of Present Illness: Patient is a 34  years old single African-American female.  Has had a difficult year with the pandemic and multiple relocation or move into apartments. Past breakups  Likes her job Doing therapy with [facility] but may look for trauma specialist Has at times flashbacks Feels lexapro keeping some balance wants to continue No nausea   Mom and sister is support system Working in therapy    Aggravating factors; relationship concerns in the past.  Multiple moves,   Modifying factor: mom, sibling sister, current job less stressful  Duration since 2011.  Hospital admission for depression 2017   Severity not worse      Past Psychiatric History: depression   Past Medical History:  Past Medical History:  Diagnosis Date  . Abnormal Pap smear of cervix   . Anxiety   . Dysmenorrhea   . GERD (gastroesophageal reflux disease)    just occasionally - diet controlled  . History of chicken pox   . Major depression   . PONV (postoperative nausea and vomiting)     Past Surgical History:  Procedure Laterality Date  . CERVICAL BIOPSY  W/ LOOP ELECTRODE EXCISION    . COLPOSCOPY    . ROBOT ASSISTED MYOMECTOMY N/A 04/02/2017    Procedure: ROBOTIC ASSISTED MYOMECTOMY with [place];  Surgeon: Delsa Bern, MD;  Location: [facility];  Service: Gynecology;  Laterality: N/A;  . WISDOM TOOTH EXTRACTION  2008    Family Psychiatric History: dad: depression, ptsd, alcohol use  Family History:  Family History  Problem Relation Age of Onset  . Hyperlipidemia Mother   . Alcohol abuse Father   . Post-traumatic stress disorder Father   . Drug abuse Father   . Lung cancer Paternal Uncle     Social History:   Social History   Socioeconomic History  . Marital status: Single    Spouse name: Not on file  . Number of children: Not on file  . Years of education: Not on file  . Highest education level: Not on file  Occupational History  . Occupation: International aid/development worker: [employer]  . Smoking status: Former Smoker    Packs/day: 0.25    Years: 4.00    Pack years: 1.00    Types: Cigarettes    Quit date: 10/28/2013    Years since quitting: 6.1  . Smokeless tobacco: Never Used  Substance and Sexual Activity  . Alcohol use: Yes    Alcohol/week: 3.0 - 4.0 standard drinks    Types: 1 - 2 Cans of beer, 2 Standard drinks or equivalent per week  . Drug use: No  . Sexual activity: Not Currently    Partners: Female    Birth control/protection: None  Other Topics Concern  . Not on file  Social History Narrative   Diet: junk food, water, some fruits and veggies, minimum calcium.   Social Determinants of Health   Financial Resource Strain:   . Difficulty of Paying Living Expenses: Not on file  Food Insecurity:   . Worried About Charity fundraiser in the Last Year: Not on file  . Ran Out of Food in the Last Year: Not on file  Transportation Needs:   . Lack of Transportation (Medical): Not on file  . Lack of Transportation (Non-Medical): Not on file  Physical Activity:   . Days of Exercise per Week: Not on file  . Minutes of Exercise per Session: Not on file  Stress:   . Feeling of Stress : Not on  file  Social Connections:   . Frequency of Communication with Friends and Family: Not on file  . Frequency of Social Gatherings with Friends and Family: Not on file  . Attends Religious Services: Not on file  . Active Member of Clubs or Organizations: Not on file  . Attends Archivist Meetings: Not on file  . Marital Status: Not on file    Allergies:  No Known Allergies  Metabolic Disorder Labs: No results found for: HGBA1C, MPG No results found for: PROLACTIN Lab Results  Component Value Date   CHOL 154 05/17/2019   TRIG 101.0 05/17/2019   HDL 52.00 05/17/2019   CHOLHDL 3 05/17/2019   VLDL 20.2 05/17/2019   LDLCALC 81 05/17/2019   LDLCALC 99 01/16/2018   No results found for: TSH  Therapeutic Level Labs: No results found for: LITHIUM No results found for: CBMZ No results found for: VALPROATE  Current Medications: Current Outpatient Medications  Medication Sig Dispense Refill  . Cyanocobalamin (B-12) 500 MCG TABS     . escitalopram (LEXAPRO) 10 MG tablet TAKE 1 TABLET BY MOUTH EVERY DAY 90 tablet 0  . famotidine (PEPCID) 20 MG tablet Take 1 tablet (20 mg total) by mouth 2 (two) times daily. 60 tablet 1  . Multiple Vitamins-Minerals (WOMENS MULTI VITAMIN & MINERAL) TABS      No current facility-administered medications for this visit.     Psychiatric Specialty Exam: Review of Systems  Cardiovascular: Negative for chest pain.  Skin: Negative for rash.  Psychiatric/Behavioral: Negative for substance abuse and suicidal ideas.    There were no vitals taken for this visit.There is no height or weight on file to calculate BMI.  General Appearance: Casual  Eye Contact:  Fair  Speech:  Slow  Volume:  Normal  Mood:  fair  Affect:  Congruent  Thought Process:  Goal Directed  Orientation:  Full (Time, Place, and Person)  Thought Content:  Logical  Suicidal Thoughts:  No  Homicidal Thoughts:  No  Memory:  Immediate;   Fair Recent;   Fair  Judgement:  Fair   Insight:  Fair  Psychomotor Activity:  Normal  Concentration:  Concentration: Fair and Attention Span: Fair  Recall:  [employer] of Knowledge:Good  Language: Good  Akathisia:  No  Handed:  Right  AIMS (if indicated):  not done  Assets:  Desire for Improvement Physical Health  ADL's:  Intact  Cognition: WNL  Sleep:  Fair   Screenings: GAD-7     Office Visit from 06/17/2019 in [place] at [facility]  Total GAD-7 Score  10    PHQ2-9     Office Visit from 06/17/2019 in [place] at [facility]  Office Visit from 01/20/2018 in [place] at [facility] from 01/16/2017 in [place] Visit from 05/31/2016 in [place] at [facility]  PHQ-2 Total Score  1  0  3  1  PHQ-9 Total Score  4  -  -  10      Assessment and Plan: as follows MDD moderate: recurrent doing fair, continue lexapro   [id number] on lexapro, continue therapy to deal with past traumas  Mood symptoms may be part of depression . No clear manic symptoms in past.  Continue to work in therapy  I discussed the assessment and treatment plan with the patient. The patient was provided an opportunity to ask questions and all were answered. The patient agreed with the plan and demonstrated an understanding of the instructions.   The patient was advised to call back or seek an in-person evaluation if the symptoms worsen or if the condition fails to improve as anticipated. Fu 61m.  Time spent 75min.  Merian Capron, MD 3/8/20213:06 PM

## 2020-01-13 ENCOUNTER — Other Ambulatory Visit: Payer: Self-pay | Admitting: Family Medicine

## 2020-01-17 ENCOUNTER — Telehealth: Payer: Self-pay | Admitting: Family Medicine

## 2020-01-17 DIAGNOSIS — K219 Gastro-esophageal reflux disease without esophagitis: Secondary | ICD-10-CM

## 2020-01-17 DIAGNOSIS — R140 Abdominal distension (gaseous): Secondary | ICD-10-CM

## 2020-01-17 NOTE — Telephone Encounter (Signed)
Patient called the office today She stated she was seen on 12/22/19 for severe bloating in her stomach, Patient was advised to call back if it did not improve. Patient feels like she has a stomach ulcer and would like to see if a referral could be sent to a specialist.  Patient was seen by you for this problem so I was not sure if this needed to go to you or Dr Diona Browner since she is the patient's primary  Please advise

## 2020-01-17 NOTE — Telephone Encounter (Signed)
Referral done Noted is [facility] pt -for corresp to go to her please   Will send to [facility]

## 2020-01-18 ENCOUNTER — Encounter: Payer: Self-pay | Admitting: Family Medicine

## 2020-01-20 ENCOUNTER — Other Ambulatory Visit: Payer: Self-pay

## 2020-01-20 ENCOUNTER — Ambulatory Visit: Payer: BC Managed Care – PPO | Admitting: Internal Medicine

## 2020-01-20 ENCOUNTER — Encounter: Payer: Self-pay | Admitting: Internal Medicine

## 2020-01-20 ENCOUNTER — Ambulatory Visit (INDEPENDENT_AMBULATORY_CARE_PROVIDER_SITE_OTHER): Payer: BC Managed Care – PPO | Admitting: Licensed Clinical Social Worker

## 2020-01-20 VITALS — BP 108/76 | HR 83 | Temp 98.3°F | Wt 217.0 lb

## 2020-01-20 DIAGNOSIS — R4586 Emotional lability: Secondary | ICD-10-CM | POA: Diagnosis not present

## 2020-01-20 DIAGNOSIS — N643 Galactorrhea not associated with childbirth: Secondary | ICD-10-CM | POA: Diagnosis not present

## 2020-01-20 DIAGNOSIS — F411 Generalized anxiety disorder: Secondary | ICD-10-CM | POA: Diagnosis not present

## 2020-01-20 DIAGNOSIS — F332 Major depressive disorder, recurrent severe without psychotic features: Secondary | ICD-10-CM

## 2020-01-20 NOTE — Progress Notes (Signed)
Virtual Visit via Video Note  I connected with Paula Evans on 01/20/20 at  4:00 PM EDT by a video enabled telemedicine application and verified that I am speaking with the correct person using two identifiers.   I discussed the limitations of evaluation and management by telemedicine and the availability of in person appointments. The patient expressed understanding and agreed to proceed.  I discussed the assessment and treatment plan with the patient. The patient was provided an opportunity to ask questions and all were answered. The patient agreed with the plan and demonstrated an understanding of the instructions.   The patient was advised to call back or seek an in-person evaluation if the symptoms worsen or if the condition fails to improve as anticipated.  I provided 45 minutes of non-face-to-face time during this encounter.  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 female who presents with at sister's home. Out in [place] started having issues with stomach. Went to doctor when got back a month and a day ago. Taking medicine for acid reflux and taking it to a week and half ago without relieving symptoms.  Reviewed has not been feeling good related to mental health because when stomach upset just doesn't feel good. Mood because doesn't feel 100%. Believes stomach ulcer self-diagnosed pretty sure that is what is. Has heard that cabbage juice helps and shares that she thinks 85%. Thinks it is stress and diet. Because of being on the road eats out all the time. Has found a hotel can cook so can help her be more aware of what eating. She has learned  never to take health for granted again. Describes dealing with an uncomfortable pain feeling. When woke up this morning there was yellow pus on shirt and stomach coming from breast.  Relates when you see something like this leads you to think something serious.  Patient was able to see her doctor today at [facility], who doesn't think it is anything alarming could be hormonal changes. Has a a mammogram scheduled, still worried because she explains to therapist there is some reason for it to be there.  Worked on challenging anxiety producing cognitions (see below) Patient said because of health causing stress and causing stress and causing anxiety.  Reviewed stress of travel patient says she finds that travel school, but also probably contributes to issues feels stress and not fully aware that she is stressed. Her days are between 9-12 or 13 hours. On feet all day too.  Describes getting relief this past Monday with average juice also relates she is tired that she went through a lot today.  Has a lot of upcoming doctor's appointment with health issues including wants to see a GI specialist, has had acid reflux long time not getting better, also has mammogram scheduled.  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  Work with patient on challenging unhelpful thought patterns  in this case catastrophizing about medical issues.  Discussing her brains can go to worst case scenario but this is a normal pattern and we have to recognize thoughts or not facts and that we cannot project future, situation often is not worst case scenario.  Also reviewed in addressing  fighter flight best way to calm this down is through relaxation strategies and provided overview of relaxation strategies of imagery, progressive relaxation.  Provided feedback that it is positive that she seen the doctor and got reassuring news to help patient in challenging anxiety thoughts.  Discussed anxiety as not to be trusted often a false alarm review history of having fear and anxiety and will see often not giving [place] the facts so it is to be tested and not trusted.  Assess continuing to work with patient on trauma related symptoms.  Provided strength based and supportive interventions  Plan: Return again in 4 weeks.2.  Therapist work with patient on anxiety, trauma, coping  Diagnosis: Axis I:  major depressive disorder, recurrent, severe, generalized anxiety disorder, mood swings    Axis II: No diagnosis    Cordella Register, LCSW 01/20/2020

## 2020-01-20 NOTE — Progress Notes (Signed)
Subjective:    Patient ID: Paula Evans, female    DOB: [birth year], 34 y.o.   MRN: [id number]  HPI  Pt presents to the clinic today with c/o left breast nipple discharge. She reports this occurred x 2 in the last week. She reports waking up with yellow crusting on her nipple. She denies breast pain, skin changes. She has not tried anything OTC for her symptoms. She has a family history of breast cancer in her great grandmother and great aunts. She has never had a mammogram.  Review of Systems   Past Medical History:  Diagnosis Date  . Abnormal Pap smear of cervix   . Anxiety   . Dysmenorrhea   . GERD (gastroesophageal reflux disease)    just occasionally - diet controlled  . History of chicken pox   . Major depression   . PONV (postoperative nausea and vomiting)     Current Outpatient Medications  Medication Sig Dispense Refill  . Cyanocobalamin (B-12) 500 MCG TABS     . escitalopram (LEXAPRO) 10 MG tablet TAKE 1 TABLET BY MOUTH EVERY DAY 90 tablet 0  . famotidine (PEPCID) 20 MG tablet Take 1 tablet (20 mg total) by mouth 2 (two) times daily. 60 tablet 1  . Multiple Vitamins-Minerals (WOMENS MULTI VITAMIN & MINERAL) TABS      No current facility-administered medications for this visit.    No Known Allergies  Family History  Problem Relation Age of Onset  . Hyperlipidemia Mother   . Alcohol abuse Father   . Post-traumatic stress disorder Father   . Drug abuse Father   . Lung cancer Paternal Uncle     Social History   Socioeconomic History  . Marital status: Single    Spouse name: Not on file  . Number of children: Not on file  . Years of education: Not on file  . Highest education level: Not on file  Occupational History  . Occupation: International aid/development worker: [employer]  . Smoking status: Former Smoker    Packs/day: 0.25    Years: 4.00    Pack years: 1.00    Types: Cigarettes    Quit date: 10/28/2013    Years since quitting: 6.2  . Smokeless  tobacco: Never Used  Substance and Sexual Activity  . Alcohol use: Yes    Alcohol/week: 3.0 - 4.0 standard drinks    Types: 1 - 2 Cans of beer, 2 Standard drinks or equivalent per week  . Drug use: No  . Sexual activity: Not Currently    Partners: Female    Birth control/protection: None  Other Topics Concern  . Not on file  Social History Narrative   Diet: junk food, water, some fruits and veggies, minimum calcium.   Social Determinants of Health   Financial Resource Strain:   . Difficulty of Paying Living Expenses:   Food Insecurity:   . Worried About Charity fundraiser in the Last Year:   . Arboriculturist in the Last Year:   Transportation Needs:   . Film/video editor (Medical):   Marland Kitchen Lack of Transportation (Non-Medical):   Physical Activity:   . Days of Exercise per Week:   . Minutes of Exercise per Session:   Stress:   . Feeling of Stress :   Social Connections:   . Frequency of Communication with Friends and Family:   . Frequency of Social Gatherings with Friends and Family:   . Attends Religious  Services:   . Active Member of Clubs or Organizations:   . Attends Archivist Meetings:   Marland Kitchen Marital Status:   Intimate Partner Violence:   . Fear of Current or Ex-Partner:   . Emotionally Abused:   Marland Kitchen Physically Abused:   . Sexually Abused:      Constitutional: Denies fever, malaise, fatigue, headache or abrupt weight changes.  Respiratory: Denies difficulty breathing, shortness of breath, cough or sputum production.   Cardiovascular: Denies chest pain, chest tightness, palpitations or swelling in the hands or feet.  Skin: Pt reports nipple discharge, left. Denies redness, rashes, lesions or ulcercations.    No other specific complaints in a complete review of systems (except as listed in HPI above).' Objective:   Physical Exam   BP 108/76   Pulse 83   Temp 98.3 F (36.8 C) (Temporal)   Wt 217 lb (98.4 kg)   LMP 01/10/2020   SpO2 98%   BMI  36.11 kg/m  Wt Readings from Last 3 Encounters:  01/20/20 217 lb (98.4 kg)  12/22/19 213 lb (96.6 kg)  07/06/19 192 lb (87.1 kg)    General: Appears their stated age, well developed, well nourished in NAD. Breast: Breasts symmetrical. Fibrocystic changes noted R>L. No mass noted. No discharge noted from either nipple. No axillary lymphadenopathy. Cardiovascular: Normal rate and rhythm. S1,S2 noted.  No murmur, rubs or gallops noted.  Pulmonary/Chest: Normal effort and positive vesicular breath sounds. No respiratory distress. No wheezes, rales or ronchi noted.  Neurological: Alert and oriented.   BMET    Component Value Date/Time   NA 140 05/17/2019 0847   NA 140 09/12/2015 1626   NA 138 07/03/2014 2008   K 4.4 05/17/2019 0847   K 3.9 07/03/2014 2008   CL 104 05/17/2019 0847   CL 104 07/03/2014 2008   CO2 29 05/17/2019 0847   CO2 30 07/03/2014 2008   GLUCOSE 88 05/17/2019 0847   GLUCOSE 112 (H) 07/03/2014 2008   BUN 10 05/17/2019 0847   BUN 8 09/12/2015 1626   BUN 11 07/03/2014 2008   CREATININE 0.78 05/17/2019 0847   CREATININE 0.92 07/03/2014 2008   CALCIUM 9.0 05/17/2019 0847   CALCIUM 10.0 07/03/2014 2008   GFRNONAA >60 03/27/2017 1455   GFRNONAA >60 07/03/2014 2008   GFRAA >60 03/27/2017 1455   GFRAA >60 07/03/2014 2008    Lipid Panel     Component Value Date/Time   CHOL 154 05/17/2019 0847   TRIG 101.0 05/17/2019 0847   HDL 52.00 05/17/2019 0847   CHOLHDL 3 05/17/2019 0847   VLDL 20.2 05/17/2019 0847   LDLCALC 81 05/17/2019 0847    CBC    Component Value Date/Time   WBC 7.6 03/27/2017 1455   RBC 4.81 03/27/2017 1455   HGB 14.3 03/27/2017 1455   HGB 12.5 09/12/2015 1626   HCT 41.3 03/27/2017 1455   HCT 38.8 09/12/2015 1626   PLT 268 03/27/2017 1455   PLT 332 09/12/2015 1626   MCV 85.9 03/27/2017 1455   MCV 78 (L) 09/12/2015 1626   MCV 84 07/03/2014 2008   MCH 29.7 03/27/2017 1455   MCHC 34.6 03/27/2017 1455   RDW 13.1 03/27/2017 1455   RDW  15.5 (H) 09/12/2015 1626   RDW 14.1 07/03/2014 2008   LYMPHSABS 1.6 01/27/2017 1128   MONOABS 0.7 01/27/2017 1128   EOSABS 0.1 01/27/2017 1128   BASOSABS 0.0 01/27/2017 1128    Hgb A1C No results found for: HGBA1C  Assessment & Plan:   Galactorrhea, Left Breast:  Will obtain diagnostic mammogram and [place] left breast  Will follow up after mammorgram Webb Silversmith, NP This visit occurred during the SARS-CoV-2 public health emergency.  Safety protocols were in place, including screening questions prior to the visit, additional usage of staff PPE, and extensive cleaning of exam room while observing appropriate contact time as indicated for disinfecting solutions.

## 2020-01-20 NOTE — Patient Instructions (Signed)
Galactorrhea Galactorrhea is the flow of a milky fluid (discharge) from the breast. It is different from normal milk in nursing mothers. The fluid can be white, yellow, or green. This condition can be caused by many things. Most cases are not serious and do not require treatment. Watch your condition to make sure it goes away. Follow these instructions at home: Breast care   Watch your condition for any changes.  Do not squeeze your breasts or nipples.  Avoid touching your breasts when you are having sex.  Perform a breast self-exam once a month.  Avoid clothes that rub on your nipples.  Use breast pads to absorb the fluid.  Wear a support bra or a breast binder. General instructions  Take over-the-counter and prescription medicines only as told by your doctor.  Keep all follow-up visits as told by your doctor. This is important. Contact a doctor if you:  Have hot flashes.  Have vaginal dryness.  Have no desire for sex.  Stop having periods, or have periods that are irregular or far apart.  Have headaches.  Cannot see well. Get help right away if:  Your breast discharge is bloody or yellowish white (puslike).  You have breast pain.  You feel a lump in your breast.  Your breast shows wrinkling or dimpling.  Your breast becomes red and swollen. Summary  Galactorrhea is the flow of a milky fluid (discharge) from the breast.  This condition may be caused by many things, but it is not usually serious.  Watch your condition carefully to make sure that it goes away.  Get help right away if the fluid is bloody or yellowish white, or if you have a lump, pain, or skin changes on your breast. This information is not intended to replace advice given to you by your health care provider. Make sure you discuss any questions you have with your health care provider. Document Revised: 10/27/2017 Document Reviewed: 10/27/2017 Elsevier Patient Education  2020 Elsevier Inc.  

## 2020-01-31 ENCOUNTER — Other Ambulatory Visit (INDEPENDENT_AMBULATORY_CARE_PROVIDER_SITE_OTHER): Payer: BC Managed Care – PPO

## 2020-01-31 ENCOUNTER — Ambulatory Visit: Payer: BC Managed Care – PPO | Admitting: Nurse Practitioner

## 2020-01-31 ENCOUNTER — Encounter: Payer: Self-pay | Admitting: Nurse Practitioner

## 2020-01-31 VITALS — BP 100/72 | HR 64 | Temp 98.7°F | Ht 65.0 in | Wt 210.4 lb

## 2020-01-31 DIAGNOSIS — R140 Abdominal distension (gaseous): Secondary | ICD-10-CM

## 2020-01-31 DIAGNOSIS — K219 Gastro-esophageal reflux disease without esophagitis: Secondary | ICD-10-CM | POA: Diagnosis not present

## 2020-01-31 LAB — CBC
HCT: 39.1 % (ref 36.0–46.0)
Hemoglobin: 13.3 g/dL (ref 12.0–15.0)
MCHC: 34.1 g/dL (ref 30.0–36.0)
MCV: 85.7 fl (ref 78.0–100.0)
Platelets: 290 10*3/uL (ref 150.0–400.0)
RBC: 4.56 Mil/uL (ref 3.87–5.11)
RDW: 13.4 % (ref 11.5–15.5)
WBC: 8.2 10*3/uL (ref 4.0–10.5)

## 2020-01-31 LAB — COMPREHENSIVE METABOLIC PANEL
ALT: 18 U/L (ref 0–35)
AST: 19 U/L (ref 0–37)
Albumin: 4.3 g/dL (ref 3.5–5.2)
Alkaline Phosphatase: 48 U/L (ref 39–117)
BUN: 7 mg/dL (ref 6–23)
CO2: 28 mEq/L (ref 19–32)
Calcium: 9.2 mg/dL (ref 8.4–10.5)
Chloride: 102 mEq/L (ref 96–112)
Creatinine, Ser: 0.65 mg/dL (ref 0.40–1.20)
GFR: 104.62 mL/min (ref >60.00)
Glucose, Bld: 81 mg/dL (ref 70–99)
Potassium: 3.6 mEq/L (ref 3.5–5.1)
Sodium: 137 mEq/L (ref 135–145)
Total Bilirubin: 0.4 mg/dL (ref 0.2–1.2)
Total Protein: 7.1 g/dL (ref 6.0–8.3)

## 2020-01-31 MED ORDER — PANTOPRAZOLE SODIUM 40 MG PO TBEC: 40.0000 mg | DELAYED_RELEASE_TABLET | Freq: Every day | ORAL | 1 refills | Status: DC

## 2020-01-31 NOTE — Progress Notes (Signed)
Cc to PCP 

## 2020-01-31 NOTE — Progress Notes (Signed)
01/31/2020 [name] [id number] Sep 22, 1986   CHIEF COMPLAINT:  GERD, abdominal bloat    HISTORY OF PRESENT ILLNESS:  Paula Evans is a 34 year old female with a past medical history of anxiety, depression and GERD. Past myomectomy. She presents today for further evaluation for GERD and abdominal bloat. She reports having a history of GERD for the past 10 years. Never had an EGD. 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 She developed excessive bloating with indigestion when she traveled to [facility] 11/2019. She was seen by Dr. Glori Bickers upon her return to [facility] and she was prescribed Pepcid 44m daily and her symptoms initially improved. [place] and her GERD symptoms were severe. She reported having heartburn all day, constant fire up and down the esophagus. Eating or drinking any foods caused esophageal burning. No dysphagia. Some upper abdominal bloat discomfort. No severe abdominal pain. She took Pepto bismal and alk seltzer as needed. She took Prilosec 284mpo bid x 2 weeks without significant improvement. She is passing a normal brown formed stool daily. No rectal bleeding or melena. She developed galactorrhea to her left breast 01/19/2020, she is scheduled for a mammogram. She denies any chance of pregnancy. LMP 01/10/2020. She has lost 7 lbs over the past week. No fever, sweats or chills. Infrequent NSAID use. No family history of esophageal, gastric or colon cancer.    Past Medical History:  Diagnosis Date  . Abnormal Pap smear of cervix   . Anxiety   . Dysmenorrhea   . GERD (gastroesophageal reflux disease)    just occasionally - diet controlled  . History of chicken pox   . Major depression   . PONV (postoperative nausea and vomiting)    Past Surgical History:  Procedure Laterality Date  . CERVICAL BIOPSY  W/ LOOP ELECTRODE EXCISION    . COLPOSCOPY    . ROBOT ASSISTED MYOMECTOMY N/A 04/02/2017   Procedure: ROBOTIC ASSISTED MYOMECTOMY with  Morcellation;  Surgeon: RiDelsa BernMD;  [facility];  Service: Gynecology;  Laterality: N/A;  . WISDOM TOOTH EXTRACTION  2008    reports that she quit smoking about 6 years ago. Her smoking use included cigarettes. She has a 1.00 pack-year smoking history. She has never used smokeless tobacco. She reports current alcohol use of about 3.0 - 4.0 standard drinks of alcohol per week. She reports that she does not use drugs. family history includes Alcohol abuse in her father; Drug abuse in her father; Hyperlipidemia in her mother; Lung cancer in her paternal uncle; Post-traumatic stress disorder in her father. No Known Allergies    Outpatient Encounter Medications as of 01/31/2020  Medication Sig  . Cyanocobalamin (B-12) 500 MCG TABS   . escitalopram (LEXAPRO) 10 MG tablet TAKE 1 TABLET BY MOUTH EVERY DAY  . famotidine (PEPCID) 20 MG tablet Take 1 tablet (20 mg total) by mouth 2 (two) times daily.  . Multiple Vitamins-Minerals (WOMENS MULTI VITAMIN & MINERAL) TABS    No facility-administered encounter medications on file as of 01/31/2020.     REVIEW OF SYSTEMS: All other systems reviewed and negative except where noted in the History of Present Illness.   PHYSICAL EXAM: LMP 01/10/2020  General: Well developed [age]ear old female in no acute distress. Head: Normocephalic and atraumatic. Eyes:  Sclerae non-icteric, conjunctive pink. Ears: Normal auditory acuity. Mouth: Dentition intact. No ulcers or lesions.  Neck: Supple, no lymphadenopathy or thyromegaly.  Lungs: Clear bilaterally to auscultation  without wheezes, crackles or rhonchi. Heart: Regular rate and rhythm. No murmur, rub or gallop appreciated.  Abdomen: Soft, nontender, non distended. No masses. No hepatosplenomegaly. Normoactive bowel sounds x 4 quadrants. Upper abdominal robotic scars intact.  Rectal: Deferred.  Musculoskeletal: Symmetrical with no gross deformities. Skin: Warm and dry. No rash or lesions on visible  extremities. Extremities: No edema. Neurological: Alert oriented x 4, no focal deficits.  Psychological:  Alert and cooperative. Normal mood and affect.  ASSESSMENT AND PLAN:  1. GERD -EGD benefits and risks discussed including risk with sedation, risk of bleeding, perforation and infection  -Pantoprazole 71m QD -GERD handout -CBC, CMP  2. Abdominal bloat, improving  -See plan in  # 1  3. Galactorrhea  -Recommend gyn follow up, consider checking prolactin level   Further follow up to be determined after the above evaluation completed.    CC:  Tower, MWynelle Fanny MD

## 2020-01-31 NOTE — Patient Instructions (Signed)
If you are age 34 or older, your body mass index should be between 23-30. Your Body mass index is 35.01 kg/m. If this is out of the aforementioned range listed, please consider follow up with your Primary Care Provider.  If you are age 24 or younger, your body mass index should be between 19-25. Your Body mass index is 35.01 kg/m. If this is out of the aformentioned range listed, please consider follow up with your Primary Care Provider.   ___________________________________________________________  We have sent the following medications to your pharmacy for you to pick up at your convenience:  Pantoprazole 40 mg  1 tablet daily  ___________________________________________________________  Follow up with your Gynecologist to consider check your prolactin level due to galactorrhea.  ___________________________________________________________ge Gastroesophageal Reflux Disease, Adult Gastroesophageal reflux (GER) happens when acid from the stomach flows up into the tube that connects the mouth and the stomach (esophagus). Normally, food travels down the esophagus and stays in the stomach to be digested. With GER, food and stomach acid sometimes move back up into the esophagus. You may have a disease called gastroesophageal reflux disease (GERD) if the reflux:  Happens often.  Causes frequent or very bad symptoms.  Causes problems such as damage to the esophagus. When this happens, the esophagus becomes sore and swollen (inflamed). Over time, GERD can make small holes (ulcers) in the lining of the esophagus. What are the causes? This condition is caused by a problem with the muscle between the esophagus and the stomach. When this muscle is weak or not normal, it does not close properly to keep food and acid from coming back up from the stomach. The muscle can be weak because of:  Tobacco use.  Pregnancy.  Having a certain type of hernia (hiatal hernia).  Alcohol use.  Certain foods  and drinks, such as coffee, chocolate, onions, and peppermint. What increases the risk? You are more likely to develop this condition if you:  Are overweight.  Have a disease that affects your connective tissue.  Use NSAID medicines. What are the signs or symptoms? Symptoms of this condition include:  Heartburn.  Difficult or painful swallowing.  The feeling of having a lump in the throat.  A bitter taste in the mouth.  Bad breath.  Having a lot of saliva.  Having an upset or bloated stomach.  Belching.  Chest pain. Different conditions can cause chest pain. Make sure you see your doctor if you have chest pain.  Shortness of breath or noisy breathing (wheezing).  Ongoing (chronic) cough or a cough at night.  Wearing away of the surface of teeth (tooth enamel).  Weight loss. How is this treated? Treatment will depend on how bad your symptoms are. Your doctor may suggest:  Changes to your diet.  Medicine.  Surgery. Follow these instructions at home: Eating and drinking   Follow a diet as told by your doctor. You may need to avoid foods and drinks such as: ? Coffee and tea (with or without caffeine). ? Drinks that contain alcohol. ? Energy drinks and sports drinks. ? Bubbly (carbonated) drinks or sodas. ? Chocolate and cocoa. ? Peppermint and mint flavorings. ? Garlic and onions. ? Horseradish. ? Spicy and acidic foods. These include peppers, chili powder, curry powder, vinegar, hot sauces, and BBQ sauce. ? Citrus fruit juices and citrus fruits, such as oranges, lemons, and limes. ? Tomato-based foods. These include red sauce, chili, salsa, and pizza with red sauce. ? Fried and fatty foods. These include donuts,  french fries, potato chips, and high-fat dressings. ? High-fat meats. These include hot dogs, rib eye steak, sausage, ham, and bacon. ? High-fat dairy items, such as whole milk, butter, and cream cheese.  Eat small meals often. Avoid eating large  meals.  Avoid drinking large amounts of liquid with your meals.  Avoid eating meals during the 2-3 hours before bedtime.  Avoid lying down right after you eat.  Do not exercise right after you eat. Lifestyle   Do not use any products that contain nicotine or tobacco. These include cigarettes, e-cigarettes, and chewing tobacco. If you need help quitting, ask your doctor.  Try to lower your stress. If you need help doing this, ask your doctor.  If you are overweight, lose an amount of weight that is healthy for you. Ask your doctor about a safe weight loss goal. General instructions  Pay attention to any changes in your symptoms.  Take over-the-counter and prescription medicines only as told by your doctor. Do not take aspirin, ibuprofen, or other NSAIDs unless your doctor says it is okay.  Wear loose clothes. Do not wear anything tight around your waist.  Raise (elevate) the head of your bed about 6 inches (15 cm).  Avoid bending over if this makes your symptoms worse.  Keep all follow-up visits as told by your doctor. This is important. Contact a doctor if:  You have new symptoms.  You lose weight and you do not know why.  You have trouble swallowing or it hurts to swallow.  You have wheezing or a cough that keeps happening.  Your symptoms do not get better with treatment.  You have a hoarse voice. Get help right away if:  You have pain in your arms, neck, jaw, teeth, or back.  You feel sweaty, dizzy, or light-headed.  You have chest pain or shortness of breath.  You throw up (vomit) and your throw-up looks like blood or coffee grounds.  You pass out (faint).  Your poop (stool) is bloody or black.  You cannot swallow, drink, or eat. Summary  If a person has gastroesophageal reflux disease (GERD), food and stomach acid move back up into the esophagus and cause symptoms or problems such as damage to the esophagus.  Treatment will depend on how bad your  symptoms are.  Follow a diet as told by your doctor.  Take all medicines only as told by your doctor. This information is not intended to replace advice given to you by your health care provider. Make sure you discuss any questions you have with your health care provider. Document Revised: 04/22/2018 Document Reviewed: 04/22/2018 Elsevier Patient Education  2020 [place] for Gastroesophageal Reflux Disease, Adult When you have gastroesophageal reflux disease (GERD), the foods you eat and your eating habits are very important. Choosing the right foods can help ease your discomfort. Think about working with a nutrition specialist (dietitian) to help you make good choices. What are tips for following this plan?  Meals  Choose healthy foods that are low in fat, such as fruits, vegetables, whole grains, low-fat dairy products, and lean meat, fish, and poultry.  Eat small meals often instead of 3 large meals a day. Eat your meals slowly, and in a place where you are relaxed. Avoid bending over or lying down until 2-3 hours after eating.  Avoid eating meals 2-3 hours before bed.  Avoid drinking a lot of liquid with meals.  Cook foods using methods other than frying. Bake, grill,  or broil food instead.  Avoid or limit: ? Chocolate. ? Peppermint or spearmint. ? Alcohol. ? Pepper. ? Black and decaffeinated coffee. ? Black and decaffeinated tea. ? Bubbly (carbonated) soft drinks. ? Caffeinated energy drinks and soft drinks.  Limit high-fat foods such as: ? Fatty meat or fried foods. ? Whole milk, cream, butter, or ice cream. ? Nuts and nut butters. ? Pastries, donuts, and sweets made with butter or shortening.  Avoid foods that cause symptoms. These foods may be different for everyone. Common foods that cause symptoms include: ? Tomatoes. ? Oranges, lemons, and limes. ? Peppers. ? Spicy food. ? Onions and garlic. ? Vinegar. Lifestyle  Maintain a healthy  weight. Ask your doctor what weight is healthy for you. If you need to lose weight, work with your doctor to do so safely.  Exercise for at least 30 minutes for 5 or more days each week, or as told by your doctor.  Wear loose-fitting clothes.  Do not smoke. If you need help quitting, ask your doctor.  Sleep with the head of your bed higher than your feet. Use a wedge under the mattress or blocks under the bed frame to raise the head of the bed. Summary  When you have gastroesophageal reflux disease (GERD), food and lifestyle choices are very important in easing your symptoms.  Eat small meals often instead of 3 large meals a day. Eat your meals slowly, and in a place where you are relaxed.  Limit high-fat foods such as fatty meat or fried foods.  Avoid bending over or lying down until 2-3 hours after eating.  Avoid peppermint and spearmint, caffeine, alcohol, and chocolate. This information is not intended to replace advice given to you by your health care provider. Make sure you discuss any questions you have with your health care provider. Document Revised: 02/04/2019 Document Reviewed: 11/19/2016 Elsevier Patient Education  [place].

## 2020-02-02 NOTE — Progress Notes (Signed)
Addendum: Reviewed and agree with assessment and management plan. Mayan Kloepfer M, MD  

## 2020-02-14 ENCOUNTER — Ambulatory Visit
Admission: RE | Admit: 2020-02-14 | Discharge: 2020-02-14 | Disposition: A | Discharge status: Home / Self Care | Payer: BC Managed Care – PPO | Source: Ambulatory Visit | Attending: Internal Medicine | Admitting: Internal Medicine

## 2020-02-14 ENCOUNTER — Other Ambulatory Visit: Payer: Self-pay

## 2020-02-14 ENCOUNTER — Other Ambulatory Visit: Payer: Self-pay | Admitting: Internal Medicine

## 2020-02-14 DIAGNOSIS — N643 Galactorrhea not associated with childbirth: Secondary | ICD-10-CM

## 2020-02-14 DIAGNOSIS — R928 Other abnormal and inconclusive findings on diagnostic imaging of breast: Secondary | ICD-10-CM | POA: Diagnosis not present

## 2020-02-14 DIAGNOSIS — N6452 Nipple discharge: Secondary | ICD-10-CM | POA: Diagnosis not present

## 2020-02-21 ENCOUNTER — Other Ambulatory Visit: Payer: Self-pay

## 2020-02-21 ENCOUNTER — Ambulatory Visit (AMBULATORY_SURGERY_CENTER): Payer: BC Managed Care – PPO | Admitting: Internal Medicine

## 2020-02-21 ENCOUNTER — Encounter: Payer: Self-pay | Admitting: Internal Medicine

## 2020-02-21 VITALS — BP 113/77 | HR 68 | Temp 96.0°F | Resp 16 | Ht 65.0 in | Wt 210.0 lb

## 2020-02-21 DIAGNOSIS — K219 Gastro-esophageal reflux disease without esophagitis: Secondary | ICD-10-CM | POA: Diagnosis not present

## 2020-02-21 DIAGNOSIS — K449 Diaphragmatic hernia without obstruction or gangrene: Secondary | ICD-10-CM | POA: Diagnosis not present

## 2020-02-21 DIAGNOSIS — K2100 Gastro-esophageal reflux disease with esophagitis, without bleeding: Secondary | ICD-10-CM | POA: Diagnosis not present

## 2020-02-21 MED ORDER — SODIUM CHLORIDE 0.9 % IV SOLN: 500.0000 mL | Freq: Once | INTRAVENOUS | Status: DC

## 2020-02-21 NOTE — Patient Instructions (Signed)
Resume previous diet.  Continue present medications.  YOU HAD AN ENDOSCOPIC PROCEDURE TODAY AT [facility]:   Refer to the procedure report that was given to you for any specific questions about what was found during the examination.  If the procedure report does not answer your questions, please call your gastroenterologist to clarify.  If you requested that your care partner not be given the details of your procedure findings, then the procedure report has been included in a sealed envelope for you to review at your convenience later.  YOU SHOULD EXPECT: Some feelings of bloating in the abdomen. Passage of more gas than usual.  Walking can help get rid of the air that was put into your GI tract during the procedure and reduce the bloating. If you had a lower endoscopy (such as a colonoscopy or flexible sigmoidoscopy) you may notice spotting of blood in your stool or on the toilet paper. If you underwent a bowel prep for your procedure, you may not have a normal bowel movement for a few days.  Please Note:  You might notice some irritation and congestion in your nose or some drainage.  This is from the oxygen used during your procedure.  There is no need for concern and it should clear up in a day or so.  SYMPTOMS TO REPORT IMMEDIATELY:    Following upper endoscopy (EGD)  Vomiting of blood or coffee ground material  New chest pain or pain under the shoulder blades  Painful or persistently difficult swallowing  New shortness of breath  Fever of 100F or higher  Black, tarry-looking stools  For urgent or emergent issues, a gastroenterologist can be reached at any hour by calling [phone]. Do not use MyChart messaging for urgent concerns.    DIET:  We do recommend a small meal at first, but then you may proceed to your regular diet.  Drink plenty of fluids but you should avoid alcoholic beverages for 24 hours.  ACTIVITY:  You should plan to take it easy for the rest  of today and you should NOT DRIVE or use heavy machinery until tomorrow (because of the sedation medicines used during the test).    FOLLOW UP: Our staff will call the number listed on your records 48-72 hours following your procedure to check on you and address any questions or concerns that you may have regarding the information given to you following your procedure. If we do not reach you, we will leave a message.  We will attempt to reach you two times.  During this call, we will ask if you have developed any symptoms of COVID 19. If you develop any symptoms (ie: fever, flu-like symptoms, shortness of breath, cough etc.) before then, please call [phone].  If you test positive for Covid 19 in the 2 weeks post procedure, please call and report this information to [place].    If any biopsies were taken you will be contacted by phone or by letter within the next 1-3 weeks.  Please call us at [phone] if you have not heard about the biopsies in 3 weeks.    SIGNATURES/CONFIDENTIALITY: You and/or your care partner have signed paperwork which will be entered into your electronic medical record.  These signatures attest to the fact that that the information above on your After Visit Summary has been reviewed and is understood.  Full responsibility of the confidentiality of this discharge information lies with you and/or your care-partner.

## 2020-02-21 NOTE — Progress Notes (Signed)
Temp by LC Vitals by DT   Pt's states no medical or surgical changes since previsit or office visit.

## 2020-02-21 NOTE — Progress Notes (Signed)
A and O x3. Report to RN. Tolerated MAC anesthesia well.Teeth unchanged after procedure.

## 2020-02-21 NOTE — Op Note (Addendum)
[place] Patient Name: Paula Evans Procedure Date: 02/21/2020 3:15 PM MRN: [id number] Endoscopist: Jerene Bears , MD Age: 34 Referring MD:  Date of Birth: 1986-04-29 Gender: Female Account #: [id number] Procedure:                Upper GI endoscopy Indications:              Gastro-esophageal reflux disease, Abdominal bloating Medicines:                Monitored Anesthesia Care Procedure:                Pre-Anesthesia Assessment:                           - Prior to the procedure, a History and Physical                            was performed, and patient medications and                            allergies were reviewed. The patient's tolerance of                            previous anesthesia was also reviewed. The risks                            and benefits of the procedure and the sedation                            options and risks were discussed with the patient.                            All questions were answered, and informed consent                            was obtained. Prior Anticoagulants: The patient has                            taken no previous anticoagulant or antiplatelet                            agents. ASA Grade Assessment: II - A patient with                            mild systemic disease. After reviewing the risks                            and benefits, the patient was deemed in                            satisfactory condition to undergo the procedure.                           After obtaining informed consent, the endoscope was  passed under direct vision. Throughout the                            procedure, the patient's blood pressure, pulse, and                            oxygen saturations were monitored continuously. The                            Endoscope was introduced through the mouth, and                            advanced to the second part of duodenum. The upper                            GI  endoscopy was accomplished without difficulty.                            The patient tolerated the procedure well. Scope In: Scope Out: Findings:                 LA Grade B (one or more mucosal breaks greater than                            5 mm, not extending between the tops of two mucosal                            folds) esophagitis with no bleeding was found 32 to                            34 cm from the incisors.                           A 3 cm hiatal hernia was found. The proximal extent                            of the gastric folds (end of tubular esophagus) was                            34 cm from the incisors. The hiatal narrowing was                            37 cm from the incisors.                           The gastroesophageal flap valve was visualized                            endoscopically and classified as Hill Grade III-IV                            (minimal fold, wide open lumen, hiatal hernia  present).                           The entire examined stomach was normal.                           The examined duodenum was normal. Complications:            No immediate complications. Estimated Blood Loss:     Estimated blood loss: none. Impression:               - LA Grade B reflux esophagitis with no bleeding.                           - 3 cm hiatal hernia.                           - Normal stomach.                           - Normal examined duodenum.                           - No specimens collected. Recommendation:           - Patient has a contact number available for                            emergencies. The signs and symptoms of potential                            delayed complications were discussed with the                            patient. Return to normal activities tomorrow.                            Written discharge instructions were provided to the                            patient.                           -  Resume previous diet.                           - Continue present medications.                           - Hiatal hernia repair with Nissen fundoplication                            could be considered in the future to help control                            reflux and lower the likelihood for long-term PPI  therapy. Jerene Bears, MD 02/21/2020 3:41:31 PM This report has been signed electronically.

## 2020-02-23 ENCOUNTER — Telehealth: Payer: Self-pay | Admitting: *Deleted

## 2020-02-23 NOTE — Telephone Encounter (Signed)
  Follow up Call-  Call back number 02/21/2020  Post procedure Call Back phone  # [phone]  Permission to leave phone message Yes  Some recent data might be hidden     Patient questions:  Do you have a fever, pain , or abdominal swelling? No. Pain Score  0 *  Have you tolerated food without any problems? Yes.    Have you been able to return to your normal activities? Yes.    Do you have any questions about your discharge instructions: Diet   No. Medications  No. Follow up visit  No.  Do you have questions or concerns about your Care? No.  Actions: * If pain score is 4 or above: No action needed, pain <4.   1. Have you developed a fever since your procedure? no  2.   Have you had an respiratory symptoms (SOB or cough) since your procedure? no  3.   Have you tested positive for COVID 19 since your procedure no  4.   Have you had any family members/close contacts diagnosed with the COVID 19 since your procedure?  no   If yes to any of these questions please route to Joylene John, RN and Erenest Rasher, RN

## 2020-03-06 ENCOUNTER — Telehealth: Payer: Self-pay | Admitting: Internal Medicine

## 2020-03-06 DIAGNOSIS — K219 Gastro-esophageal reflux disease without esophagitis: Secondary | ICD-10-CM

## 2020-03-06 DIAGNOSIS — R140 Abdominal distension (gaseous): Secondary | ICD-10-CM

## 2020-03-06 MED ORDER — PANTOPRAZOLE SODIUM 40 MG PO TBEC: 40.0000 mg | DELAYED_RELEASE_TABLET | Freq: Every day | ORAL | 1 refills | Status: DC

## 2020-03-06 NOTE — Telephone Encounter (Signed)
Rx sent 

## 2020-03-07 MED ORDER — PANTOPRAZOLE SODIUM 40 MG PO TBEC: 40.0000 mg | DELAYED_RELEASE_TABLET | Freq: Every day | ORAL | 0 refills | Status: DC

## 2020-03-07 NOTE — Telephone Encounter (Signed)
Says that her insurance will only cover/pay for this prescription if it's a 90day supply. Can we please re-send this way?

## 2020-03-07 NOTE — Addendum Note (Signed)
Addended by: Larina Bras on: 03/07/2020 12:10 PM   Modules accepted: Orders

## 2020-03-28 ENCOUNTER — Other Ambulatory Visit (HOSPITAL_COMMUNITY): Payer: Self-pay

## 2020-03-28 MED ORDER — ESCITALOPRAM OXALATE 10 MG PO TABS: 10.0000 mg | ORAL_TABLET | Freq: Every day | ORAL | 0 refills | Status: DC

## 2020-04-07 ENCOUNTER — Encounter (HOSPITAL_COMMUNITY): Payer: Self-pay | Admitting: Psychiatry

## 2020-04-07 ENCOUNTER — Other Ambulatory Visit: Payer: Self-pay

## 2020-04-07 ENCOUNTER — Telehealth (INDEPENDENT_AMBULATORY_CARE_PROVIDER_SITE_OTHER): Payer: BC Managed Care – PPO | Admitting: Psychiatry

## 2020-04-07 DIAGNOSIS — F332 Major depressive disorder, recurrent severe without psychotic features: Secondary | ICD-10-CM

## 2020-04-07 DIAGNOSIS — F411 Generalized anxiety disorder: Secondary | ICD-10-CM

## 2020-04-07 NOTE — Progress Notes (Addendum)
[place] Follow up visit   Patient Identification: Paula Evans MRN:  [id number] Date of Evaluation:  04/07/2020 Referral Source: Therapist and primary care Chief Complaint:   Depression follow up   I connected with [name] on 04/07/20 at 75;30 AM EST by a video enabled telemedicine application and verified that I am speaking with the correct person using two identifiers.   Patient location home Provider location: home office   I discussed the limitations of evaluation and management by telemedicine and the availability of in person appointments. The patient expressed understanding and agreed to proceed. Visit Diagnosis:    ICD-10-CM   1. Severe episode of recurrent major depressive disorder, without psychotic features ([place])  F33.2   2. GAD (generalized anxiety disorder)  F41.1     History of Present Illness: Patient is a 34  years old single African-American female.  Has had a difficult year with the pandemic and multiple relocation or move into apartments. Past breakups  Doing fair, have to travel for work so not like it and get home sick  Feels lexapro keeping some balance wants to continue No nausea   Mom and sister is support system Working in therapy but have been a while   Aggravating factors; relationship concerns in the past.  Multiple moves  Modifying factor: mom, sibling sister, current job less stressful  Duration since 2011.  Hospital admission for depression 2017   Severity not worse      Past Psychiatric History: depression   Past Medical History:  Past Medical History:  Diagnosis Date  . Abnormal Pap smear of cervix   . Anxiety   . Depression   . Dysmenorrhea   . GERD (gastroesophageal reflux disease)    just occasionally - diet controlled  . History of chicken pox   . Irritable bowel syndrome (IBS)   . Major depression   . PONV (postoperative nausea and vomiting)     Past Surgical History:  Procedure Laterality Date  . CERVICAL  BIOPSY  W/ LOOP ELECTRODE EXCISION    . COLPOSCOPY    . ROBOT ASSISTED MYOMECTOMY N/A 04/02/2017   Procedure: ROBOTIC ASSISTED MYOMECTOMY with Morcellation;  Surgeon: Delsa Bern, MD;  Location: [facility];  Service: Gynecology;  Laterality: N/A;  . WISDOM TOOTH EXTRACTION  2008    Family Psychiatric History: dad: depression, ptsd, alcohol use  Family History:  Family History  Problem Relation Age of Onset  . Hyperlipidemia Mother   . Alcohol abuse Father   . Post-traumatic stress disorder Father   . Drug abuse Father   . Lung cancer Paternal Uncle   . Diabetes Paternal Uncle   . Breast cancer Maternal Grandmother   . Colon polyps Paternal Grandmother   . Colon cancer Neg Hx   . Esophageal cancer Neg Hx   . Rectal cancer Neg Hx   . Stomach cancer Neg Hx     Social History:   Social History   Socioeconomic History  . Marital status: Single    Spouse name: Not on file  . Number of children: 0  . Years of education: Not on file  . Highest education level: Not on file  Occupational History  . Occupation: dining services/chef    Employer: [employer] Use  . Smoking status: Former Smoker    Packs/day: 0.25    Years: 4.00    Pack years: 1.00    Types: Cigarettes    Quit date: 10/28/2013    Years since quitting: 6.4  .  Smokeless tobacco: Never Used  Vaping Use  . Vaping Use: Never used  Substance and Sexual Activity  . Alcohol use: Yes    Alcohol/week: 3.0 - 4.0 standard drinks    Types: 1 - 2 Cans of beer, 2 Standard drinks or equivalent per week    Comment: occasional  . Drug use: No  . Sexual activity: Not Currently    Partners: Female    Birth control/protection: None  Other Topics Concern  . Not on file  Social History Narrative   Diet: junk food, water, some fruits and veggies, minimum calcium.   Social Determinants of Health   Financial Resource Strain:   . Difficulty of Paying Living Expenses:   Food Insecurity:   . Worried About Charity fundraiser in  the Last Year:   . Arboriculturist in the Last Year:   Transportation Needs:   . Film/video editor (Medical):   Marland Kitchen Lack of Transportation (Non-Medical):   Physical Activity:   . Days of Exercise per Week:   . Minutes of Exercise per Session:   Stress:   . Feeling of Stress :   Social Connections:   . Frequency of Communication with Friends and Family:   . Frequency of Social Gatherings with Friends and Family:   . Attends Religious Services:   . Active Member of Clubs or Organizations:   . Attends Archivist Meetings:   Marland Kitchen Marital Status:     Allergies:  No Known Allergies  Metabolic Disorder Labs: No results found for: HGBA1C, MPG No results found for: PROLACTIN Lab Results  Component Value Date   CHOL 154 05/17/2019   TRIG 101.0 05/17/2019   HDL 52.00 05/17/2019   CHOLHDL 3 05/17/2019   VLDL 20.2 05/17/2019   LDLCALC 81 05/17/2019   LDLCALC 99 01/16/2018   No results found for: TSH  Therapeutic Level Labs: No results found for: LITHIUM No results found for: CBMZ No results found for: VALPROATE  Current Medications: Current Outpatient Medications  Medication Sig Dispense Refill  . Cyanocobalamin (B-12) 500 MCG TABS     . escitalopram (LEXAPRO) 10 MG tablet Take 1 tablet (10 mg total) by mouth daily. 90 tablet 0  . Multiple Vitamins-Minerals (WOMENS MULTI VITAMIN & MINERAL) TABS     . pantoprazole (PROTONIX) 40 MG tablet Take 1 tablet (40 mg total) by mouth daily. Pharmacy-please d/c rx for 30 day script 90 tablet 0   No current facility-administered medications for this visit.     Psychiatric Specialty Exam: Review of Systems  Cardiovascular: Negative for chest pain.  Skin: Negative for rash.  Psychiatric/Behavioral: Negative for substance abuse and suicidal ideas.    There were no vitals taken for this visit.There is no height or weight on file to calculate BMI.  General Appearance: Casual  Eye Contact:  Fair  Speech:  Slow  Volume:   Normal  Mood:  fair  Affect:  Congruent  Thought Process:  Goal Directed  Orientation:  Full (Time, Place, and Person)  Thought Content:  Logical  Suicidal Thoughts:  No  Homicidal Thoughts:  No  Memory:  Immediate;   Fair Recent;   Fair  Judgement:  Fair  Insight:  Fair  Psychomotor Activity:  Normal  Concentration:  Concentration: Fair and Attention Span: Fair  Recall:  [employer] of Knowledge:Good  Language: Good  Akathisia:  No  Handed:  Right  AIMS (if indicated):  not done  Assets:  Desire for Improvement  Physical Health  ADL's:  Intact  Cognition: WNL  Sleep:  Fair   Screenings: GAD-7     Office Visit from 06/17/2019 in [place] at [facility]  Total GAD-7 Score 10    PHQ2-9     Office Visit from 06/17/2019 in [place] at [place] from 01/20/2018 in [place] at [facility] from 01/16/2017 in [place] Visit from 05/31/2016 in [place] at [facility]  PHQ-2 Total Score 1 0 3 1  PHQ-9 Total Score 4 -- -- 10      Assessment and Plan: as follows MDD moderate: recurrent : doing fair, continue lexapro  WHQ:[id number] on lexapro, continue therapy to deal with past traumas  Mood symptoms may be part of depression . No clear manic symptoms in past.  Continue to work in therapy  I discussed the assessment and treatment plan with the patient. The patient was provided an opportunity to ask questions and all were answered. The patient agreed with the plan and demonstrated an understanding of the instructions.   The patient was advised to call back or seek an in-person evaluation if the symptoms worsen or if the condition fails to improve as anticipated. Fu 44m.  Time spent non face to face 10 min.  Merian Capron, MD 6/11/20219:33 AM

## 2020-04-13 ENCOUNTER — Telehealth: Payer: Self-pay | Admitting: Internal Medicine

## 2020-04-13 NOTE — Telephone Encounter (Signed)
Pt states she is taking protonix in the am but still waking up with heartburn symptoms. Discussed with pt that she could try pepcid 20mg  at bedtime that is otc to see if that helps her symptoms. Pt has OV scheduled.

## 2020-04-13 NOTE — Telephone Encounter (Signed)
Pt is requesting a call back to discuss her acid reflux and vomiting. Pt is scheduled to see Dr Hilarie Fredrickson on 05/23/20 and nothing was available sooner. Pt would like to be advised what she can do.

## 2020-04-13 NOTE — Telephone Encounter (Signed)
Left message for pt to call back  °

## 2020-04-14 ENCOUNTER — Observation Stay (HOSPITAL_COMMUNITY)
Admission: AD | Admit: 2020-04-14 | Discharge: 2020-04-15 | Disposition: A | Discharge status: Home / Self Care | Payer: BC Managed Care – PPO | Source: Other Acute Inpatient Hospital | Attending: Obstetrics & Gynecology | Admitting: Obstetrics & Gynecology

## 2020-04-14 ENCOUNTER — Emergency Department
Admission: EM | Admit: 2020-04-14 | Discharge: 2020-04-14 | Disposition: A | Discharge status: Home / Self Care | Payer: BC Managed Care – PPO | Attending: Student in an Organized Health Care Education/Training Program | Admitting: Student in an Organized Health Care Education/Training Program

## 2020-04-14 ENCOUNTER — Other Ambulatory Visit: Payer: Self-pay

## 2020-04-14 ENCOUNTER — Emergency Department: Payer: BC Managed Care – PPO

## 2020-04-14 ENCOUNTER — Other Ambulatory Visit: Payer: Self-pay | Admitting: Gynecologic Oncology

## 2020-04-14 ENCOUNTER — Encounter (HOSPITAL_COMMUNITY): Payer: Self-pay | Admitting: Obstetrics & Gynecology

## 2020-04-14 ENCOUNTER — Encounter: Payer: Self-pay | Admitting: Emergency Medicine

## 2020-04-14 DIAGNOSIS — N838 Other noninflammatory disorders of ovary, fallopian tube and broad ligament: Principal | ICD-10-CM | POA: Insufficient documentation

## 2020-04-14 DIAGNOSIS — R1900 Intra-abdominal and pelvic swelling, mass and lump, unspecified site: Secondary | ICD-10-CM

## 2020-04-14 DIAGNOSIS — N946 Dysmenorrhea, unspecified: Secondary | ICD-10-CM | POA: Insufficient documentation

## 2020-04-14 DIAGNOSIS — R1909 Other intra-abdominal and pelvic swelling, mass and lump: Secondary | ICD-10-CM | POA: Diagnosis not present

## 2020-04-14 DIAGNOSIS — K219 Gastro-esophageal reflux disease without esophagitis: Secondary | ICD-10-CM | POA: Diagnosis not present

## 2020-04-14 DIAGNOSIS — R112 Nausea with vomiting, unspecified: Secondary | ICD-10-CM | POA: Insufficient documentation

## 2020-04-14 DIAGNOSIS — F419 Anxiety disorder, unspecified: Secondary | ICD-10-CM | POA: Insufficient documentation

## 2020-04-14 DIAGNOSIS — Z87891 Personal history of nicotine dependence: Secondary | ICD-10-CM | POA: Diagnosis not present

## 2020-04-14 DIAGNOSIS — R188 Other ascites: Secondary | ICD-10-CM

## 2020-04-14 DIAGNOSIS — F329 Major depressive disorder, single episode, unspecified: Secondary | ICD-10-CM | POA: Insufficient documentation

## 2020-04-14 DIAGNOSIS — R109 Unspecified abdominal pain: Secondary | ICD-10-CM | POA: Insufficient documentation

## 2020-04-14 DIAGNOSIS — R140 Abdominal distension (gaseous): Secondary | ICD-10-CM | POA: Diagnosis not present

## 2020-04-14 DIAGNOSIS — R1110 Vomiting, unspecified: Secondary | ICD-10-CM | POA: Diagnosis not present

## 2020-04-14 DIAGNOSIS — N9489 Other specified conditions associated with female genital organs and menstrual cycle: Secondary | ICD-10-CM

## 2020-04-14 LAB — URINALYSIS, COMPLETE (UACMP) WITH MICROSCOPIC
Bacteria, UA: NONE SEEN
Bilirubin Urine: NEGATIVE
Glucose, UA: NEGATIVE mg/dL
Ketones, ur: 20 mg/dL — AB
Nitrite: NEGATIVE
Protein, ur: 30 mg/dL — AB
RBC / HPF: >50 RBC/hpf — ABNORMAL HIGH (ref 0–5)
Specific Gravity, Urine: 1.021 (ref 1.005–1.030)
pH: 6 (ref 5.0–8.0)

## 2020-04-14 LAB — COMPREHENSIVE METABOLIC PANEL
ALT: 13 U/L (ref 0–44)
AST: 20 U/L (ref 15–41)
Albumin: 3.5 g/dL (ref 3.5–5.0)
Alkaline Phosphatase: 48 U/L (ref 38–126)
Anion gap: 9 (ref 5–15)
BUN: 14 mg/dL (ref 6–20)
CO2: 23 mmol/L (ref 22–32)
Calcium: 8.5 mg/dL — ABNORMAL LOW (ref 8.9–10.3)
Chloride: 102 mmol/L (ref 98–111)
Creatinine, Ser: 0.95 mg/dL (ref 0.44–1.00)
GFR calc Af Amer: >60 mL/min (ref >60)
GFR calc non Af Amer: >60 mL/min (ref >60)
Glucose, Bld: 95 mg/dL (ref 70–99)
Potassium: 3.6 mmol/L (ref 3.5–5.1)
Sodium: 134 mmol/L — ABNORMAL LOW (ref 135–145)
Total Bilirubin: 0.9 mg/dL (ref 0.3–1.2)
Total Protein: 6.5 g/dL (ref 6.5–8.1)

## 2020-04-14 LAB — CBC
HCT: 42.7 % (ref 36.0–46.0)
Hemoglobin: 14.8 g/dL (ref 12.0–15.0)
MCH: 27.8 pg (ref 26.0–34.0)
MCHC: 34.7 g/dL (ref 30.0–36.0)
MCV: 80.3 fL (ref 80.0–100.0)
Platelets: 335 10*3/uL (ref 150–400)
RBC: 5.32 MIL/uL — ABNORMAL HIGH (ref 3.87–5.11)
RDW: 13.4 % (ref 11.5–15.5)
WBC: 9.4 10*3/uL (ref 4.0–10.5)
nRBC: 0 % (ref 0.0–0.2)

## 2020-04-14 LAB — POCT PREGNANCY, URINE: Preg Test, Ur: NEGATIVE

## 2020-04-14 LAB — HCG, QUANTITATIVE, PREGNANCY: hCG, Beta Chain, Quant, S: <1 m[IU]/mL (ref <5)

## 2020-04-14 LAB — LIPASE, BLOOD: Lipase: 26 U/L (ref 11–51)

## 2020-04-14 MED ORDER — SODIUM CHLORIDE 0.9 % IV SOLN: 250.0000 mL | INTRAVENOUS | Status: DC | PRN

## 2020-04-14 MED ORDER — PRENATAL MULTIVITAMIN CH
1.0000 | ORAL_TABLET | Freq: Every day | ORAL | Status: DC
  Filled 2020-04-14: qty 1

## 2020-04-14 MED ORDER — IOHEXOL 300 MG/ML  SOLN
100.0000 mL | Freq: Once | INTRAMUSCULAR | Status: AC | PRN
  Administered 2020-04-14: 100 mL via INTRAVENOUS
  Filled 2020-04-14: qty 100

## 2020-04-14 MED ORDER — HYDROMORPHONE HCL 1 MG/ML IJ SOLN
0.2000 mg | INTRAMUSCULAR | Status: DC | PRN
  Administered 2020-04-14: 0.5 mg via INTRAVENOUS
  Filled 2020-04-14: qty 1

## 2020-04-14 MED ORDER — ONDANSETRON HCL 4 MG/2ML IJ SOLN
4.0000 mg | Freq: Once | INTRAMUSCULAR | Status: AC
  Administered 2020-04-14: 4 mg via INTRAVENOUS
  Filled 2020-04-14: qty 2

## 2020-04-14 MED ORDER — SODIUM CHLORIDE 0.9% FLUSH: 3.0000 mL | Freq: Once | INTRAVENOUS | Status: DC

## 2020-04-14 MED ORDER — OXYCODONE-ACETAMINOPHEN 5-325 MG PO TABS
1.0000 | ORAL_TABLET | ORAL | Status: DC | PRN
  Filled 2020-04-14: qty 1

## 2020-04-14 MED ORDER — SIMETHICONE 80 MG PO CHEW
80.0000 mg | CHEWABLE_TABLET | Freq: Four times a day (QID) | ORAL | Status: DC | PRN
  Administered 2020-04-14: 80 mg via ORAL
  Filled 2020-04-14: qty 1

## 2020-04-14 MED ORDER — SODIUM CHLORIDE 0.9% FLUSH
3.0000 mL | INTRAVENOUS | Status: DC | PRN
  Administered 2020-04-14: 3 mL via INTRAVENOUS

## 2020-04-14 MED ORDER — SODIUM CHLORIDE 0.9 % IV BOLUS
1000.0000 mL | Freq: Once | INTRAVENOUS | Status: AC
  Administered 2020-04-14: 1000 mL via INTRAVENOUS

## 2020-04-14 MED ORDER — SODIUM CHLORIDE 0.9% FLUSH
3.0000 mL | Freq: Two times a day (BID) | INTRAVENOUS | Status: DC
  Administered 2020-04-14: 3 mL via INTRAVENOUS

## 2020-04-14 MED ORDER — ONDANSETRON HCL 4 MG PO TABS
4.0000 mg | ORAL_TABLET | Freq: Four times a day (QID) | ORAL | Status: DC | PRN
  Filled 2020-04-14: qty 1

## 2020-04-14 MED ORDER — HEPARIN SODIUM (PORCINE) 5000 UNIT/ML IJ SOLN
5000.0000 [IU] | Freq: Three times a day (TID) | INTRAMUSCULAR | Status: DC
  Administered 2020-04-14 – 2020-04-15 (×2): 5000 [IU] via SUBCUTANEOUS
  Filled 2020-04-14 (×2): qty 1

## 2020-04-14 MED ORDER — DIPHENHYDRAMINE HCL 25 MG PO CAPS: 25.0000 mg | ORAL_CAPSULE | Freq: Four times a day (QID) | ORAL | Status: DC | PRN

## 2020-04-14 MED ORDER — ONDANSETRON HCL 4 MG/2ML IJ SOLN
4.0000 mg | Freq: Four times a day (QID) | INTRAMUSCULAR | Status: DC | PRN
  Administered 2020-04-14 – 2020-04-15 (×2): 4 mg via INTRAVENOUS
  Filled 2020-04-14 (×2): qty 2

## 2020-04-14 NOTE — ED Notes (Signed)
ED Provider at bedside. 

## 2020-04-14 NOTE — ED Provider Notes (Signed)
[facility] Emergency Department Provider Note    First MD Initiated Contact with Patient 04/14/20 1230     (approximate)  I have reviewed the triage vital signs and the nursing notes.   HISTORY  Chief Complaint Abdominal Pain    HPI Paula Evans is a 34 y.o. female [id number] with the below listed past medical history presents as ER for evaluation of severe abdominal pain associate with nausea and vomiting.  States that she had several episodes of blood-tinged emesis.  Denies any coffee-ground emesis melena or hematochezia.  Actually feels like she is not passing gas feels like she has not moved her bowels.  Feels like she is having abdominal distention.  Does have a history of uterine fibroids.  Denies any fevers or chills.  No chest pain or shortness of breath.    Past Medical History:  Diagnosis Date  . Abnormal Pap smear of cervix   . Anxiety   . Depression   . Dysmenorrhea   . GERD (gastroesophageal reflux disease)    just occasionally - diet controlled  . History of chicken pox   . Irritable bowel syndrome (IBS)   . Major depression   . PONV (postoperative nausea and vomiting)    Family History  Problem Relation Age of Onset  . Hyperlipidemia Mother   . Alcohol abuse Father   . Post-traumatic stress disorder Father   . Drug abuse Father   . Lung cancer Paternal Uncle   . Diabetes Paternal Uncle   . Breast cancer Maternal Grandmother   . Colon polyps Paternal Grandmother   . Colon cancer Neg Hx   . Esophageal cancer Neg Hx   . Rectal cancer Neg Hx   . Stomach cancer Neg Hx    Past Surgical History:  Procedure Laterality Date  . CERVICAL BIOPSY  W/ LOOP ELECTRODE EXCISION    . COLPOSCOPY    . ROBOT ASSISTED MYOMECTOMY N/A 04/02/2017   Procedure: ROBOTIC ASSISTED MYOMECTOMY with Morcellation;  Surgeon: Delsa Bern, MD;  Location: [facility];  Service: Gynecology;  Laterality: N/A;  . WISDOM TOOTH EXTRACTION  2008   Patient Active  Problem List   Diagnosis Date Noted  . Bloating 12/22/2019  . Acute foot pain, right 07/06/2019  . Urinary frequency 06/18/2019  . UTI (urinary tract infection) 06/17/2019  . Cough 11/15/2018  . Obesity (BMI 30-39.9) 01/20/2018  . Fibroids, intramural 04/02/2017  . Generalized anxiety disorder 06/14/2016  . Involuntary commitment 06/13/2016  . CIN II (cervical intraepithelial neoplasia II) 05/09/2015  . Lumbar back pain with radiculopathy affecting right lower extremity 11/08/2014  . Constipation 11/08/2014  . Pap smear abnormality of cervix with ASCUS favoring dysplasia 09/08/2014  . Carpal tunnel syndrome, left 07/12/2014  . ABNORMAL GLANDULAR PAPANICOLAOU SMEAR OF CERVIX 08/16/2010  . Moderate depressed bipolar I disorder ([place]) 09/10/2007  . GERD 09/10/2007  . Severe episode of recurrent major depressive disorder, without psychotic features ([place]) 06/22/2007      Prior to Admission medications   Medication Sig Start Date End Date Taking? Authorizing Provider  Cyanocobalamin (B-12) 500 MCG TABS  11/29/19   [provider]  escitalopram (LEXAPRO) 10 MG tablet Take 1 tablet (10 mg total) by mouth daily. 03/28/20   Merian Capron, MD  Multiple Vitamins-Minerals (WOMENS MULTI VITAMIN & MINERAL) TABS  11/07/19   [provider]  pantoprazole (PROTONIX) 40 MG tablet Take 1 tablet (40 mg total) by mouth daily. Pharmacy-please d/c rx for 30 day script 03/07/20  Pyrtle, Lajuan Lines, MD    Allergies Patient has no known allergies.    Social History Social History   Tobacco Use  . Smoking status: Former Smoker    Packs/day: 0.25    Years: 4.00    Pack years: 1.00    Types: Cigarettes    Quit date: 10/28/2013    Years since quitting: 6.4  . Smokeless tobacco: Never Used  Vaping Use  . Vaping Use: Never used  Substance Use Topics  . Alcohol use: Yes    Alcohol/week: 3.0 - 4.0 standard drinks    Types: 1 - 2 Cans of beer, 2 Standard drinks or equivalent per week     Comment: occasional  . Drug use: No    Review of Systems Patient denies headaches, rhinorrhea, blurry vision, numbness, shortness of breath, chest pain, edema, cough, abdominal pain, nausea, vomiting, diarrhea, dysuria, fevers, rashes or hallucinations unless otherwise stated above in HPI. ____________________________________________   PHYSICAL EXAM:  VITAL SIGNS: Vitals:   04/14/20 1151 04/14/20 1233  BP: 135/88 136/87  Pulse: 89 89  Resp: 16 18  Temp: 98.7 F (37.1 C) 99.1 F (37.3 C)  SpO2: 100% 100%    Constitutional: Alert and oriented.  Eyes: Conjunctivae are normal.  Head: Atraumatic. Nose: No congestion/rhinnorhea. Mouth/Throat: Mucous membranes are moist.   Neck: No stridor. Painless ROM.  Cardiovascular: Normal rate, regular rhythm. Grossly normal heart sounds.  Good peripheral circulation. Respiratory: Normal respiratory effort.  No retractions. Lungs CTAB. Gastrointestinal: Soft with mild generalized ttp, no guarding or rebound tenderness. No abdominal bruits. No CVA tenderness. Genitourinary:  Musculoskeletal: No lower extremity tenderness nor edema.  No joint effusions. Neurologic:  Normal speech and language. No gross focal neurologic deficits are appreciated. No facial droop Skin:  Skin is warm, dry and intact. No rash noted. Psychiatric: Mood and affect are normal. Speech and behavior are normal.  ____________________________________________   LABS (all labs ordered are listed, but only abnormal results are displayed)  Results for orders placed or performed during the hospital encounter of 04/14/20 (from the past 24 hour(s))  Lipase, blood     Status: None   Collection Time: 04/14/20  7:30 AM  Result Value Ref Range   Lipase 26 11 - 51 U/L  Comprehensive metabolic panel     Status: Abnormal   Collection Time: 04/14/20  7:30 AM  Result Value Ref Range   Sodium 134 (L) 135 - 145 mmol/L   Potassium 3.6 3.5 - 5.1 mmol/L   Chloride 102 98 - 111 mmol/L    CO2 23 22 - 32 mmol/L   Glucose, Bld 95 70 - 99 mg/dL   BUN 14 6 - 20 mg/dL   Creatinine, Ser 0.95 0.44 - 1.00 mg/dL   Calcium 8.5 (L) 8.9 - 10.3 mg/dL   Total Protein 6.5 6.5 - 8.1 g/dL   Albumin 3.5 3.5 - 5.0 g/dL   AST 20 15 - 41 U/L   ALT 13 0 - 44 U/L   Alkaline Phosphatase 48 38 - 126 U/L   Total Bilirubin 0.9 0.3 - 1.2 mg/dL   GFR calc non Af Amer >60 >60 mL/min   GFR calc Af Amer >60 >60 mL/min   Anion gap 9 5 - 15  CBC     Status: Abnormal   Collection Time: 04/14/20  7:30 AM  Result Value Ref Range   WBC 9.4 4.0 - 10.5 K/uL   RBC 5.32 (H) 3.87 - 5.11 MIL/uL   Hemoglobin 14.8 12.0 -  15.0 g/dL   HCT 42.7 36 - 46 %   MCV 80.3 80.0 - 100.0 fL   MCH 27.8 26.0 - 34.0 pg   MCHC 34.7 30.0 - 36.0 g/dL   RDW 13.4 11.5 - 15.5 %   Platelets 335 150 - 400 K/uL   nRBC 0.0 0.0 - 0.2 %  Urinalysis, Complete w Microscopic     Status: Abnormal   Collection Time: 04/14/20  7:32 AM  Result Value Ref Range   Color, Urine YELLOW (A) YELLOW   APPearance CLOUDY (A) CLEAR   Specific Gravity, Urine 1.021 1.005 - 1.030   pH 6.0 5.0 - 8.0   Glucose, UA NEGATIVE NEGATIVE mg/dL   Hgb urine dipstick LARGE (A) NEGATIVE   Bilirubin Urine NEGATIVE NEGATIVE   Ketones, ur 20 (A) NEGATIVE mg/dL   Protein, ur 30 (A) NEGATIVE mg/dL   Nitrite NEGATIVE NEGATIVE   Leukocytes,Ua SMALL (A) NEGATIVE   RBC / HPF >50 (H) 0 - 5 RBC/hpf   WBC, UA 21-50 0 - 5 WBC/hpf   Bacteria, UA NONE SEEN NONE SEEN   Squamous Epithelial / LPF 11-20 0 - 5   WBC Clumps PRESENT    Mucus PRESENT   Pregnancy, urine POC     Status: None   Collection Time: 04/14/20  8:04 AM  Result Value Ref Range   Preg Test, Ur NEGATIVE NEGATIVE   ____________________________________________ ____________________________________________  RADIOLOGY  I personally reviewed all radiographic images ordered to evaluate for the above acute complaints and reviewed radiology reports and findings.  These findings were personally discussed  with the patient.  Please see medical record for radiology report.  ____________________________________________   PROCEDURES  Procedure(s) performed:  Procedures    Critical Care performed: no ____________________________________________   INITIAL IMPRESSION / ASSESSMENT AND PLAN / ED COURSE  Pertinent labs & imaging results that were available during my care of the patient were reviewed by me and considered in my medical decision making (see chart for details).   DDX: sbo, enteritis, gastritis, malloryweiss tear, ileus, ibs, ibd  Ezma Rehm is a 34 y.o. who presents to the ED with symptoms as described above.  Patient presenting with epigastric pain nausea and vomiting as well as abdominal distention.  Does have some mild pain and does have distended belly is not pregnant.  CT imaging will be ordered for the but differential.  Will provide IV fluids as well as IV antiemetic.  Clinical Course as of Apr 14 1445  Fri Apr 14, 2020  1411 Discussed results of CT imaging with patient.  Inquired about her OB/GYN provider she states she follows with [facility] in [place].  Will consult with them for further recommendations.   [PR]  1438 Discussed case with Dr. Alwyn Pea of OB/GYN in consultation.   [PR]    Clinical Course User Index [PR] Merlyn Lot, MD    The patient was evaluated in Emergency Department today for the symptoms described in the history of present illness. He/she was evaluated in the context of the global COVID-19 pandemic, which necessitated consideration that the patient might be at risk for infection with the SARS-CoV-2 virus that causes COVID-19. Institutional protocols and algorithms that pertain to the evaluation of patients at risk for COVID-19 are in a state of rapid change based on information released by regulatory bodies including the CDC and federal and state organizations. These policies and algorithms were followed during the patient's care  in the ED.  As part of my  medical decision making, I reviewed the following data within the [place] notes reviewed and incorporated, Labs reviewed, notes from prior ED visits and [place] Controlled Substance Database   ____________________________________________   FINAL CLINICAL IMPRESSION(S) / ED DIAGNOSES  Final diagnoses:  Adnexal mass  Other ascites  Nausea and vomiting, intractability of vomiting not specified, unspecified vomiting type      NEW MEDICATIONS STARTED DURING THIS VISIT:  New Prescriptions   No medications on file     Note:  This document was prepared using Dragon voice recognition software and may include unintentional dictation errors.    Merlyn Lot, MD 04/14/20 1446

## 2020-04-14 NOTE — ED Notes (Signed)
Patient transported to CT 

## 2020-04-14 NOTE — ED Triage Notes (Signed)
Pt to ED via POV c/o Abdominal pain since Monday. Pt states that she has been having vomiting x 2 days. Pt reports vomiting 5-6 times a day. Pt states that there has been blood in her vomit the past 2 days. Pt has hx/o hiatal hernia. Pt states that she is unable to have a good bowel movement and that her abdomen is very bloated.

## 2020-04-15 DIAGNOSIS — Z87891 Personal history of nicotine dependence: Secondary | ICD-10-CM | POA: Diagnosis not present

## 2020-04-15 DIAGNOSIS — N946 Dysmenorrhea, unspecified: Secondary | ICD-10-CM | POA: Diagnosis not present

## 2020-04-15 DIAGNOSIS — R109 Unspecified abdominal pain: Secondary | ICD-10-CM | POA: Diagnosis not present

## 2020-04-15 DIAGNOSIS — R188 Other ascites: Secondary | ICD-10-CM | POA: Diagnosis not present

## 2020-04-15 DIAGNOSIS — K219 Gastro-esophageal reflux disease without esophagitis: Secondary | ICD-10-CM | POA: Diagnosis not present

## 2020-04-15 DIAGNOSIS — F329 Major depressive disorder, single episode, unspecified: Secondary | ICD-10-CM | POA: Diagnosis not present

## 2020-04-15 DIAGNOSIS — F419 Anxiety disorder, unspecified: Secondary | ICD-10-CM | POA: Diagnosis not present

## 2020-04-15 DIAGNOSIS — R112 Nausea with vomiting, unspecified: Secondary | ICD-10-CM | POA: Diagnosis not present

## 2020-04-15 DIAGNOSIS — N838 Other noninflammatory disorders of ovary, fallopian tube and broad ligament: Secondary | ICD-10-CM | POA: Diagnosis not present

## 2020-04-15 LAB — CA 125: Cancer Antigen (CA) 125: 86.4 U/mL — ABNORMAL HIGH (ref 0.0–38.1)

## 2020-04-15 LAB — CEA: CEA: 1.2 ng/mL (ref 0.0–4.7)

## 2020-04-15 LAB — AFP TUMOR MARKER: AFP, Serum, Tumor Marker: 1.5 ng/mL (ref 0.0–8.3)

## 2020-04-15 MED ORDER — ACETAMINOPHEN 500 MG PO TABS: 1000.0000 mg | ORAL_TABLET | Freq: Four times a day (QID) | ORAL | Status: DC | PRN

## 2020-04-15 MED ORDER — HYDROMORPHONE HCL 2 MG PO TABS
2.0000 mg | ORAL_TABLET | ORAL | Status: DC | PRN
  Administered 2020-04-15: 2 mg via ORAL
  Filled 2020-04-15: qty 1

## 2020-04-15 MED ORDER — HYDROMORPHONE HCL 2 MG PO TABS: 2.0000 mg | ORAL_TABLET | ORAL | 0 refills | Status: DC | PRN

## 2020-04-15 MED ORDER — ONDANSETRON HCL 4 MG PO TABS: 4.0000 mg | ORAL_TABLET | Freq: Four times a day (QID) | ORAL | 0 refills | Status: DC | PRN

## 2020-04-15 MED ORDER — NAPROXEN SODIUM 275 MG PO TABS
550.0000 mg | ORAL_TABLET | Freq: Two times a day (BID) | ORAL | Status: DC
  Filled 2020-04-15 (×2): qty 2

## 2020-04-15 MED ORDER — PANTOPRAZOLE SODIUM 40 MG PO TBEC
40.0000 mg | DELAYED_RELEASE_TABLET | Freq: Every day | ORAL | Status: DC
  Administered 2020-04-15: 40 mg via ORAL
  Filled 2020-04-15: qty 1

## 2020-04-15 MED ORDER — NAPROXEN SODIUM 550 MG PO TABS: 550.0000 mg | ORAL_TABLET | Freq: Two times a day (BID) | ORAL | 1 refills | Status: DC

## 2020-04-15 MED ORDER — ACETAMINOPHEN 500 MG PO TABS: 1000.0000 mg | ORAL_TABLET | Freq: Four times a day (QID) | ORAL | 0 refills | Status: DC | PRN

## 2020-04-15 MED ORDER — SIMETHICONE 80 MG PO CHEW: 80.0000 mg | CHEWABLE_TABLET | Freq: Four times a day (QID) | ORAL | 0 refills | Status: DC | PRN

## 2020-04-15 MED ORDER — ESCITALOPRAM OXALATE 10 MG PO TABS
10.0000 mg | ORAL_TABLET | Freq: Every day | ORAL | Status: DC
  Filled 2020-04-15: qty 1

## 2020-04-15 NOTE — Plan of Care (Signed)

## 2020-04-15 NOTE — Discharge Summary (Signed)
Physician Discharge Summary  Patient ID: Faatima Tench MRN: [id number] DOB/AGE: December 31, 1985 34 y.o.  Admit date: 04/14/2020 Discharge date: 04/15/2020  Admission Diagnoses: Ovarian mass, right [N83.8]  Discharge Diagnoses: Ovarian mass, right [N83.8]        Principal Problem:   Ovarian mass, right   Discharged Condition: good  Hospital Course: patient admitted for pain and nausea management  Physical Exam:   BP 130/87 (BP Location: Left Arm)   Pulse 93   Temp 97.6 F (36.4 C) (Oral)   Resp 18   Ht 5\' 4"  (1.626 m)   Wt 97.5 kg   SpO2 99%   BMI 36.90 kg/m  Constitutional: alert, in no distress Cardiovascular: lungs clear, heart with normal rate and rhythm Abdomen: distended non-tender Extremities: no significant edema and no evidence of DVT     Consults: Dr Berline Lopes Gyn-oncology. Follow-up scheduled 04/17/20. Surgery scheduled 04/20/20  Disposition: Discharge disposition: 01-Home or Self Care       Discharge Instructions    Discharge patient   Complete by: As directed    Discharge disposition: 01-Home or Self Care   Discharge patient date: 04/15/2020     Allergies as of 04/15/2020   No Known Allergies     Medication List    STOP taking these medications   B-12 500 MCG Tabs     TAKE these medications   acetaminophen 500 MG tablet Commonly known as: TYLENOL Take 2 tablets (1,000 mg total) by mouth every 6 (six) hours as needed for moderate pain.   escitalopram 10 MG tablet Commonly known as: LEXAPRO Take 1 tablet (10 mg total) by mouth daily.   HYDROmorphone 2 MG tablet Commonly known as: DILAUDID Take 1 tablet (2 mg total) by mouth every 4 (four) hours as needed for severe pain.   HYDROmorphone 2 MG tablet Commonly known as: Dilaudid Take 1 tablet (2 mg total) by mouth every 4 (four) hours as needed for severe pain.   naproxen sodium 550 MG tablet Commonly known as: ANAPROX Take 1 tablet (550 mg total) by mouth 2 (two) times daily with a  meal.   ondansetron 4 MG tablet Commonly known as: ZOFRAN Take 1 tablet (4 mg total) by mouth every 6 (six) hours as needed for nausea.   pantoprazole 40 MG tablet Commonly known as: PROTONIX Take 1 tablet (40 mg total) by mouth daily. Pharmacy-please d/c rx for 30 day script   simethicone 80 MG chewable tablet Commonly known as: MYLICON Chew 1 tablet (80 mg total) by mouth 4 (four) times daily as needed for flatulence (gas).        Signed: Alwyn Pea, MD 04/15/2020, 9:00 AM

## 2020-04-15 NOTE — Discharge Instructions (Signed)
Pelvic Mass, Female  A pelvic mass is an abnormal growth in the pelvis. The pelvis is the area between your hip bones. It includes the bladder, rectum, uterus, and ovaries. A pelvic mass may be found during a routine pelvic exam or while performing an MRI, CT scan, or ultrasound for other problems of the abdomen. What are common types of pelvic masses? Pelvic masses include:  Ovarian cysts. These are fluid-filled sacs that form on an ovary.  Tumors. These may be cancerous (malignant) or noncancerous (benign). Noncancerous tumors in the uterus are called uterine fibroids.  Ectopic pregnancy. This is when the fertilized egg attaches (implants) outside the uterus.  Infections. What type of testing may be needed? Your health care provider may recommend that you have tests to diagnose the cause of the pelvic mass. The following tests may be done if a pelvic mass is found:  Physical exam.  Blood tests.  X-rays.  Ultrasound.  CT scan.  MRI.  A surgery to look inside your abdomen with cameras (laparoscopy).  A biopsy that is performed with a needle or during laparoscopy or surgery. In some cases, what seemed like a pelvic mass may actually be something else, such as a mass in one of the organs that is near the pelvis, an infection (abscess), or scar tissue (adhesions) that formed after a surgery. Tests and physical exams may be done once, or they may be done regularly for a period of time. Tests and exams that are done regularly will help monitor whether the mass or tissue change is growing and becoming a concern. What are common treatments? Treatment is not always needed for this condition. Your health care provider may recommend careful monitoring (watchful waiting) and regular tests and exams. Treatment will depend on the cause of the mass. Follow these instructions at home:  What you need to do at home will depend on the cause of the mass. Follow the instructions that your health  care provider gives to you. In general: ? Keep all follow-up visits as directed by your health care provider. This is important. ? Take over-the-counter and prescription medicines only as directed by your health care provider. ? If you were prescribed an antibiotic medicine, take it as told by your health care provider. Do not stop taking the antibiotic even if you start to feel better. ? Follow any restrictions that are given to you by your health care provider.  Try to stay calm, and be sure to ask questions. Make sure you understand the recommendations for monitoring and whether there is a reason for concern. Contact a health care provider if you:  Develop new symptoms.  Note changes in the size, shape, or position of your mass.  Are unable to have a bowel movement.  Bruise or bleed easily. Get help right away if you:  Vomit bright red blood or vomit material that looks like coffee grounds.  Have blood in your stools, or the stools turn black and tarry.  Have an abnormal or increased amount of vaginal bleeding.  Have a fever or chills.  Develop sudden or worsening pain that is not relieved by medicine.  Feel dizzy or weak.  Feel light-headed or you faint.  Feel that the mass has suddenly gotten larger.  Develop severe bloating in your abdomen or your pelvis.  Cannot pass any urine. Summary  A pelvic mass is an abnormal growth in the pelvis. The pelvis is the area between your hip bones. It includes the bladder, rectum,  uterus, and ovaries.  Pelvic masses include ovarian cysts, tumors, ectopic pregnancy, or infections.  Your health care provider may recommend that you have tests to diagnose the cause of the pelvic mass.  Treatment will depend on the cause of the mass. This information is not intended to replace advice given to you by your health care provider. Make sure you discuss any questions you have with your health care provider. Document Revised: 11/05/2017  Document Reviewed: 11/05/2017 Elsevier Patient Education  2020 Elsevier Inc.  

## 2020-04-15 NOTE — H&P (Addendum)
Reason for admission:   Large pelvic mass with ascites   History:     Paula Evans is a 34 y.o. female, [place], presented to [facility] ED yesterday  for evaluation of severe abdominal pain associate with nausea and vomiting.  States that she had several episodes of blood-tinged emesis.  Denies any coffee-ground emesis melena or hematochezia.  Actually feels like she is not passing gas feels like she has not moved her bowels.  Feels like she is having abdominal distention.  Does have a history of uterine fibroids.  Denies any fevers or chills.  No chest pain or shortness of breath.  Cycles have remained monthly, lasting 5 days with normal flow. Patient reports progressively worsening of dysmenorrhea responsive to Naproxen. Worsening GERD/nausea/decrease appetite is what brought patient to ED.  Ct-scan revealed the following:  1. 19 x 18 x 13 cm complex mixed solid and cystic mass arising from the right adnexa consistent with ovarian carcinoma. 2. 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 3. Extensive ascites throughout the abdomen likely secondary to the ovarian tumor.  Labs: normal CBC and CMP. U/A with large Hgb.Negative HcG. Elevated CA-125 at 86.4  Patient was admitted transferred and admitted to [facility] for further evaluation, pain management. Patient awaiting Gyn-oncology consultation on Monday with scheduled surgery on 04/20/20.    Review of system:   Patient denies headaches, rhinorrhea, blurry vision, numbness, shortness of breath, chest pain, edema, cough, diarrhea, dysuria, fevers, rashes or hallucinations unless otherwise stated above in HPI.  Past Medical History:   Past Medical History:  Diagnosis Date   Abnormal Pap smear of cervix    Anxiety    Depression    Dysmenorrhea    GERD (gastroesophageal reflux disease)    just occasionally - diet controlled   History of chicken pox    Irritable bowel syndrome (IBS)    Major depression    PONV  (postoperative nausea and vomiting)    Past Surgical History:   2016 Cervical LEEP 2018 Robotic myometomy   Allergies:  No Known Allergies  Social History:   Social History   Socioeconomic History   Marital status: Single    Spouse name: Not on file   Number of children: 0   Years of education: Not on file   Highest education level: Not on file  Occupational History   Occupation: dining services/chef    Employer: [employer]  Tobacco Use   Smoking status: Former Smoker    Packs/day: 0.25    Years: 4.00    Pack years: 1.00    Types: Cigarettes    Quit date: 10/28/2013    Years since quitting: 6.4   Smokeless tobacco: Never Used  Vaping Use   Vaping Use: Never used  Substance and Sexual Activity   Alcohol use: Yes    Alcohol/week: 3.0 - 4.0 standard drinks    Types: 1 - 2 Cans of beer, 2 Standard drinks or equivalent per week    Comment: occasional   Drug use: No   Sexual activity: Not Currently    Partners: Female    Birth control/protection: None  Other Topics Concern   Not on file  Social History Narrative   Diet: junk food, water, some fruits and veggies, minimum calcium.   Social Determinants of Health   Financial Resource Strain:    Difficulty of Paying Living Expenses:   Food Insecurity:    Worried About Charity fundraiser in the Last Year:  Ran Out of Food in the Last Year:   Transportation Needs:    Film/video editor (Medical):    Lack of Transportation (Non-Medical):   Physical Activity:    Days of Exercise per Week:    Minutes of Exercise per Session:   Stress:    Feeling of Stress :   Social Connections:    Frequency of Communication with Friends and Family:    Frequency of Social Gatherings with Friends and Family:    Attends Religious Services:    Active Member of Clubs or Organizations:    Attends Music therapist:    Marital Status:     Family History:    Family History  Problem Relation  Age of Onset   Hyperlipidemia Mother    Alcohol abuse Father    Post-traumatic stress disorder Father    Drug abuse Father    Lung cancer Paternal Uncle    Diabetes Paternal Uncle    Breast cancer Maternal Grandmother    Colon polyps Paternal Grandmother    Colon cancer Neg Hx    Esophageal cancer Neg Hx    Rectal cancer Neg Hx    Stomach cancer Neg Hx     Physical exam:    General Appearance: Alert, appropriate appearance for age. No acute distress Neck / Thyroid: Supple, no masses, nodes or enlargement Lungs: clear to auscultation bilaterally Back: No CVA tenderness. Cardiovascular: Regular rate and rhythm. S1, S2, no murmur Gastrointestinal: distended, non-tender, suprapubic mass palpated 4 cm below umbilicus    Assessment:   Right ovarian mass with ascites   Plan:    Reviewed all findings with patient and family today Pain is well controlled with dilaudid. Not having pain today. Patient desires d/c home if pain and nausea are controlled with oral meds. Patient will return 04/17/20 for Gyn-oncology consultation.

## 2020-04-15 NOTE — Plan of Care (Signed)

## 2020-04-17 ENCOUNTER — Encounter: Payer: Self-pay | Admitting: Gynecologic Oncology

## 2020-04-17 ENCOUNTER — Encounter (HOSPITAL_COMMUNITY)
Admit: 2020-04-17 | Discharge: 2020-04-17 | Disposition: A | Discharge status: Home / Self Care | Payer: BC Managed Care – PPO | Attending: Gynecologic Oncology | Admitting: Gynecologic Oncology

## 2020-04-17 ENCOUNTER — Encounter (HOSPITAL_COMMUNITY): Payer: Self-pay

## 2020-04-17 ENCOUNTER — Other Ambulatory Visit (HOSPITAL_COMMUNITY)
Admission: RE | Admit: 2020-04-17 | Discharge: 2020-04-17 | Disposition: A | Discharge status: Home / Self Care | Payer: BC Managed Care – PPO | Source: Ambulatory Visit | Attending: Gynecologic Oncology | Admitting: Gynecologic Oncology

## 2020-04-17 ENCOUNTER — Other Ambulatory Visit: Payer: Self-pay

## 2020-04-17 ENCOUNTER — Inpatient Hospital Stay: Payer: BC Managed Care – PPO | Attending: Gynecologic Oncology | Admitting: Gynecologic Oncology

## 2020-04-17 ENCOUNTER — Other Ambulatory Visit (HOSPITAL_COMMUNITY): Payer: Self-pay

## 2020-04-17 VITALS — BP 120/82 | HR 86 | Temp 98.6°F | Resp 16 | Ht 64.0 in | Wt 222.1 lb

## 2020-04-17 DIAGNOSIS — Z20822 Contact with and (suspected) exposure to covid-19: Secondary | ICD-10-CM | POA: Insufficient documentation

## 2020-04-17 DIAGNOSIS — Z801 Family history of malignant neoplasm of trachea, bronchus and lung: Secondary | ICD-10-CM | POA: Diagnosis not present

## 2020-04-17 DIAGNOSIS — R188 Other ascites: Secondary | ICD-10-CM | POA: Diagnosis not present

## 2020-04-17 DIAGNOSIS — N9489 Other specified conditions associated with female genital organs and menstrual cycle: Secondary | ICD-10-CM

## 2020-04-17 DIAGNOSIS — D271 Benign neoplasm of left ovary: Secondary | ICD-10-CM | POA: Diagnosis not present

## 2020-04-17 DIAGNOSIS — Z01812 Encounter for preprocedural laboratory examination: Secondary | ICD-10-CM | POA: Insufficient documentation

## 2020-04-17 DIAGNOSIS — Z87410 Personal history of cervical dysplasia: Secondary | ICD-10-CM | POA: Diagnosis not present

## 2020-04-17 DIAGNOSIS — D261 Other benign neoplasm of corpus uteri: Secondary | ICD-10-CM | POA: Diagnosis not present

## 2020-04-17 DIAGNOSIS — R970 Elevated carcinoembryonic antigen [CEA]: Secondary | ICD-10-CM | POA: Diagnosis not present

## 2020-04-17 DIAGNOSIS — Z87891 Personal history of nicotine dependence: Secondary | ICD-10-CM | POA: Diagnosis not present

## 2020-04-17 DIAGNOSIS — Z803 Family history of malignant neoplasm of breast: Secondary | ICD-10-CM | POA: Diagnosis not present

## 2020-04-17 DIAGNOSIS — D251 Intramural leiomyoma of uterus: Secondary | ICD-10-CM | POA: Diagnosis not present

## 2020-04-17 DIAGNOSIS — Z8041 Family history of malignant neoplasm of ovary: Secondary | ICD-10-CM | POA: Diagnosis not present

## 2020-04-17 DIAGNOSIS — Z791 Long term (current) use of non-steroidal anti-inflammatories (NSAID): Secondary | ICD-10-CM | POA: Diagnosis not present

## 2020-04-17 DIAGNOSIS — Z79899 Other long term (current) drug therapy: Secondary | ICD-10-CM | POA: Diagnosis not present

## 2020-04-17 HISTORY — DX: Other noninflammatory disorders of ovary, fallopian tube and broad ligament: N83.8

## 2020-04-17 LAB — BASIC METABOLIC PANEL
Anion gap: 11 (ref 5–15)
BUN: 10 mg/dL (ref 6–20)
CO2: 25 mmol/L (ref 22–32)
Calcium: 8.6 mg/dL — ABNORMAL LOW (ref 8.9–10.3)
Chloride: 101 mmol/L (ref 98–111)
Creatinine, Ser: 0.88 mg/dL (ref 0.44–1.00)
GFR calc Af Amer: >60 mL/min (ref >60)
GFR calc non Af Amer: >60 mL/min (ref >60)
Glucose, Bld: 92 mg/dL (ref 70–99)
Potassium: 4.5 mmol/L (ref 3.5–5.1)
Sodium: 137 mmol/L (ref 135–145)

## 2020-04-17 LAB — CBC
HCT: 40.6 % (ref 36.0–46.0)
Hemoglobin: 13.4 g/dL (ref 12.0–15.0)
MCH: 28.2 pg (ref 26.0–34.0)
MCHC: 33 g/dL (ref 30.0–36.0)
MCV: 85.3 fL (ref 80.0–100.0)
Platelets: 350 10*3/uL (ref 150–400)
RBC: 4.76 MIL/uL (ref 3.87–5.11)
RDW: 13.3 % (ref 11.5–15.5)
WBC: 7.7 10*3/uL (ref 4.0–10.5)
nRBC: 0 % (ref 0.0–0.2)

## 2020-04-17 LAB — URINALYSIS, ROUTINE W REFLEX MICROSCOPIC
Bilirubin Urine: NEGATIVE
Glucose, UA: NEGATIVE mg/dL
Hgb urine dipstick: NEGATIVE
Ketones, ur: 20 mg/dL — AB
Leukocytes,Ua: NEGATIVE
Nitrite: NEGATIVE
Protein, ur: NEGATIVE mg/dL
Specific Gravity, Urine: 1.015 (ref 1.005–1.030)
pH: 6 (ref 5.0–8.0)

## 2020-04-17 LAB — SARS CORONAVIRUS 2 (TAT 6-24 HRS): SARS Coronavirus 2: NEGATIVE

## 2020-04-17 LAB — LACTATE DEHYDROGENASE: LDH: 171 U/L (ref 98–192)

## 2020-04-17 NOTE — Progress Notes (Signed)
GYNECOLOGIC ONCOLOGY NEW PATIENT CONSULTATION   Patient Name: Paula Evans  Patient Age: 34 y.o. Date of Service: 04/17/20 Referring Provider: Dr. Cletis Media  Primary Care Provider: Jinny Sanders, MD Consulting Provider: Jeral Pinch, MD   Assessment/Plan:  Premenopausal patient with a large complex pelvic mass and ascites.  Discussed CT findings and possible etiologies including a benign ovarian mass (ascites could be attributed to spontaneous mass rupture), borderline tumor, and malignancy.  The patient understands that definitive surgery is the only way to get a pathologic diagnosis.  Most of her tumor markers have resulted in the only abnormal marker is her Ca1 25.  We discussed that this is not a very sensitive or specific marker.  It is only mildly elevated in a premenopausal patient (in the 80s) and is difficult to interpret.    She is currently scheduled for surgery on Thursday.  Given her large volume ascites as well as the size of this mass, I recommend that we proceed with exploratory laparotomy.  I will drain her ascites and plan to remove the enlarged adnexa and sent for pathologic review while she is asleep.  If no malignancy is identified, then I will leave her other tube and ovary as well as uterus in situ.  We discussed the possibility of a borderline tumor.  While she is not interested in childbearing at this time.  We discussed that if she were to meet a partner, that she could imagine a scenario where she would want to have children.  I think given this, we discussed fertility sparing in the setting of a borderline tumor with the plan for completion surgery after she finished childbearing.  If malignancy is encountered, then we discussed surgical staging with the goal of fertility preservation.  We discussed that this would mean taking biopsies of pelvic and para-aortic lymph nodes as well as removing part of her omentum.    Discussed the plan for exploratory laparotomy,  unilateral salpingo-oophorectomy, possible staging to include contralateral salpingo-oophorectomy, total hysterectomy, pelvic and periaortic lymph node dissection, omentectomy, tumor debulking, and any other indicated procedures. The risks of surgery were discussed in detail and she understands these to include infection; wound separation; hernia; vaginal cuff separation, injury to adjacent organs such as bowel, bladder, blood vessels, ureters and nerves; bleeding which may require blood transfusion; anesthesia risk; thromboembolic events; possible death; unforeseen complications; possible need for re-exploration; medical complications such as heart attack, stroke, pleural effusion and pneumonia; and, if full lymphadenectomy is performed the risk of lymphedema and lymphocyst. The patient will receive DVT and antibiotic prophylaxis as indicated. She voiced a clear understanding. She had the opportunity to ask questions. Perioperative instructions were reviewed with her. Prescriptions for post-op medications were sent to her pharmacy of choice.  A copy of this note was sent to the patient's referring provider.   Jeral Pinch, MD  Division of Gynecologic Oncology  Department of Obstetrics and Gynecology  [facility]  ___________________________________________  Chief Complaint: Chief Complaint  Patient presents with  . Adnexal mass    New Patient    History of Present Illness:  Paula Evans is a 34 y.o. y.o. female who is seen in consultation at the request of Dr. Cletis Media for an evaluation of large pelvic mass and ascites.   The patient was initially seen in late February with abdominal bloating and stool changes.  She had a history of heartburn that had worsened recently.  Patient was suspected to have IBS as  well as bloating secondary to food choices.  She was referred to GI and was seen there at the beginning of April.  Endoscopy was recommended.  She continued to  have worsening symptoms with GERD and episodes of emesis.  Ultimately, she presented to an outside emergency department on 6/18 with abdominal pain for 5 days, emesis for 2 days, abdominal bloating and constipation.  Imaging showed a large pelvic mass with significant ascites.  She was ultimately transferred from [facility] ED to [facility] for evaluation.  She was observed overnight and both her abdominal pain and nausea were able to be controlled with p.o. medications.  She was discharged home on Saturday.  Since being home, she notes overall that her symptoms have been better controlled.  She was mostly comfortable yesterday but today has had some increasing nausea and abdominal pain.  She describes having diffuse abdominal discomfort, feelings of heartburn, and the sensation that she is short of breath.  She also endorses back pain and lower extremity edema, which she first noticed while traveling in [place] last month.  She notes having a good appetite although is not able to eat much secondary to early satiety.  She has struggled with constipation and is currently using stool softeners twice a day, prunes and suppositories as needed.  She was discharged home from the hospital with naproxen, Tylenol and Dilaudid.  She has been using Dilaudid as needed for pain.  Patient's gynecologic history is notable for robotic myomectomy for uterine fibroids as well as a history of cervical dysplasia.  She works as a traveling IT consultant.  Last menstrual period was from 6/15-20.  She is not currently sexually active.  In the past, she thought she wanted kids.  At this time, she is not interested in childbearing.  PAST MEDICAL HISTORY:  Past Medical History:  Diagnosis Date  . Abnormal Pap smear of cervix   . Anxiety   . Depression   . Dysmenorrhea   . GERD (gastroesophageal reflux disease)    just occasionally - diet controlled  . History of chicken pox   . Irritable bowel syndrome (IBS)   . Major  depression   . PONV (postoperative nausea and vomiting)      PAST SURGICAL HISTORY:  Past Surgical History:  Procedure Laterality Date  . CERVICAL BIOPSY  W/ LOOP ELECTRODE EXCISION    . COLPOSCOPY    . ROBOT ASSISTED MYOMECTOMY N/A 04/02/2017   Procedure: ROBOTIC ASSISTED MYOMECTOMY with Morcellation;  Surgeon: Delsa Bern, MD;  Location: [facility];  Service: Gynecology;  Laterality: N/A;  . WISDOM TOOTH EXTRACTION  2008    OB/GYN HISTORY:  OB History  Gravida Para Term Preterm AB Living  0 0 0 0 0 0  SAB TAB Ectopic Multiple Live Births  0 0 0 0      No LMP recorded.  Age at menarche: 43 Last pap: 2018 History of abnormal pap smears: yes, s/p LEEP in 2016  SCREENING STUDIES:  Last mammogram: 2021  Last colonoscopy: n/a, had upper endoscopy in 01/2020  MEDICATIONS: Outpatient Encounter Medications as of 04/17/2020  Medication Sig  . acetaminophen (TYLENOL) 500 MG tablet Take 2 tablets (1,000 mg total) by mouth every 6 (six) hours as needed for moderate pain.  Marland Kitchen escitalopram (LEXAPRO) 10 MG tablet Take 1 tablet (10 mg total) by mouth daily.  Marland Kitchen HYDROmorphone (DILAUDID) 2 MG tablet Take 1 tablet (2 mg total) by mouth every 4 (four) hours as needed for severe pain.  Marland Kitchen  naproxen sodium (ANAPROX) 550 MG tablet Take 1 tablet (550 mg total) by mouth 2 (two) times daily with a meal.  . ondansetron (ZOFRAN) 4 MG tablet Take 1 tablet (4 mg total) by mouth every 6 (six) hours as needed for nausea.  . pantoprazole (PROTONIX) 40 MG tablet Take 1 tablet (40 mg total) by mouth daily. Pharmacy-please d/c rx for 30 day script  . simethicone (MYLICON) 80 MG chewable tablet Chew 1 tablet (80 mg total) by mouth 4 (four) times daily as needed for flatulence (gas).  Marland Kitchen HYDROmorphone (DILAUDID) 2 MG tablet Take 1 tablet (2 mg total) by mouth every 4 (four) hours as needed for severe pain.  . [DISCONTINUED] Cyanocobalamin (B-12) 500 MCG TABS  (Patient not taking: Reported on 04/14/2020)  .  [DISCONTINUED] Multiple Vitamins-Minerals (WOMENS MULTI VITAMIN & MINERAL) TABS  (Patient not taking: Reported on 04/14/2020)  . [DISCONTINUED] sodium chloride flush (NS) 0.9 % injection 3 mL    No facility-administered encounter medications on file as of 04/17/2020.    ALLERGIES:  No Known Allergies   FAMILY HISTORY:  Family History  Problem Relation Age of Onset  . Hyperlipidemia Mother   . Alcohol abuse Father   . Post-traumatic stress disorder Father   . Drug abuse Father   . Lung cancer Paternal Uncle   . Diabetes Paternal Uncle   . Breast cancer Maternal Grandmother   . Colon polyps Paternal Grandmother   . Cancer Maternal Aunt        ovarian  . Colon cancer Neg Hx   . Esophageal cancer Neg Hx   . Rectal cancer Neg Hx   . Stomach cancer Neg Hx      SOCIAL HISTORY:    Social Connections:   . Frequency of Communication with Friends and Family:   . Frequency of Social Gatherings with Friends and Family:   . Attends Religious Services:   . Active Member of Clubs or Organizations:   . Attends Archivist Meetings:   Marland Kitchen Marital Status:     REVIEW OF SYSTEMS:  Denies appetite changes, fevers, chills, fatigue, unexplained weight changes. Denies hearing loss, neck lumps or masses, mouth sores, ringing in ears or voice changes. Denies cough or wheezing.  Denies shortness of breath. Denies chest pain or palpitations. Denies leg swelling. Denies abdominal distention, pain, blood in stools, constipation, diarrhea, nausea, vomiting, or early satiety. Denies pain with intercourse, dysuria, frequency, hematuria or incontinence. Denies hot flashes, pelvic pain, vaginal bleeding or vaginal discharge.   Denies joint pain, back pain or muscle pain/cramps. Denies itching, rash, or wounds. Denies dizziness, headaches, numbness or seizures. Denies swollen lymph nodes or glands, denies easy bruising or bleeding. Denies anxiety, depression, confusion, or decreased  concentration.  Physical Exam:  Vital Signs for this encounter:  Blood pressure 120/82, pulse 86, temperature 98.6 F (37 C), temperature source Oral, resp. rate 16, height 5\' 4"  (1.626 m), weight 222 lb 2 oz (100.8 kg), SpO2 100 %. Body mass index is 38.13 kg/m. General: Alert, oriented, no acute distress.  HEENT: Normocephalic, atraumatic. Sclera anicteric.  Chest: Clear to auscultation bilaterally. No wheezes, rhonchi, or rales.  Somewhat short of breath while talking to me. Cardiovascular: Regular rate and rhythm, no murmurs, rubs, or gallops.  Abdomen: Tense, distended, diffusely mildly tender to palpation. No discrete masses or hepatosplenomegaly appreciated. + fluid wave. Extremities: Grossly normal range of motion. Warm, well perfused. Trace edema bilaterally.  Skin: No rashes or lesions.  Lymphatics: No cervical, supraclavicular, or  inguinal adenopathy.  GU:  Normal external female genitalia.  No lesions. No discharge or bleeding.             Speculum exam deferred. On bimanual, uterus deviated anteriorly, moderately mobile. Large mass fills the pelvis, no nodularity appreciated on RV exam.  Fullness consistent with ascites appreciated.  LABORATORY AND RADIOLOGIC DATA:  Outside medical records were reviewed to synthesize the above history, along with the history and physical obtained during the visit.   Lab Results  Component Value Date   WBC 9.4 04/14/2020   HGB 14.8 04/14/2020   HCT 42.7 04/14/2020   PLT 335 04/14/2020   GLUCOSE 95 04/14/2020   CHOL 154 05/17/2019   TRIG 101.0 05/17/2019   HDL 52.00 05/17/2019   LDLCALC 81 05/17/2019   ALT 13 04/14/2020   AST 20 04/14/2020   NA 134 (L) 04/14/2020   K 3.6 04/14/2020   CL 102 04/14/2020   CREATININE 0.95 04/14/2020   BUN 14 04/14/2020   CO2 23 04/14/2020   CEA: 1.2 AFP: 1.5 Inhibin B: pending CA-125: 86.4 Hcg: <1  CT A/P on 6/18: FINDINGS: Lower chest: Normal.  Hepatobiliary: No focal liver abnormality is  seen. No gallstones, gallbladder wall thickening, or biliary dilatation.  Pancreas: Unremarkable. No pancreatic ductal dilatation or surrounding inflammatory changes.  Spleen: Normal in size without focal abnormality.  Adrenals/Urinary Tract: Adrenal glands are unremarkable. Kidneys are normal, without renal calculi, focal lesion, or hydronephrosis. Bladder is unremarkable.  Stomach/Bowel: Small hiatal hernia. Stomach is otherwise normal. Appendix is not visualized. No evidence of bowel wall thickening, distention, or inflammatory changes.  Vascular/Lymphatic: No significant vascular findings are present. No enlarged abdominal or pelvic lymph nodes.  Reproductive: There is a 19 x 18 x 13 cm complex mixed solid and cystic mass arising from the right adnexa, consistent with ovarian carcinoma. 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 The uterus is inhomogeneous with multiple small lesions which are likely to represent fibroids. The right ovarian  Tumor deviates the uterus anteriorly and to the left.  Other: There is extensive ascites throughout the abdomen. The ascites extends into the middle mediastinum adjacent to the distal esophagus.  Musculoskeletal: No acute abnormality. Bilateral pars defects at L5.  IMPRESSION: 1. 19 x 18 x 13 cm complex mixed solid and cystic mass arising from the right adnexa consistent with ovarian carcinoma. 2. 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 3. Extensive ascites throughout the abdomen likely secondary to the ovarian tumor.

## 2020-04-17 NOTE — Addendum Note (Signed)
Addended by: Lafonda Mosses on: 04/17/2020 11:38 AM   Modules accepted: Orders

## 2020-04-17 NOTE — Patient Instructions (Addendum)
DUE TO COVID-19 ONLY ONE VISITOR ARE ALLOWED TO COME WITH YOU AND STAY IN THE WAITING ROOM ONLY DURING PRE OP AND PROCEDURE. THEN TWO VISITORS MAY VISIT WITH YOU IN YOUR PRIVATE ROOM DURING VISITING HOURS ONLY!!   COVID SWAB TESTING  COMPLETED ON:   Monday, April 17, 2020    (Must self quarantine after testing. Follow instructions on handout.)             Your procedure is scheduled on: Thursday, April 20, 2020   Report to [facility] Main  Entrance   Report to Short Stay at 5:30 AM   [facility])   Call this number if you have problems the morning of surgery [phone]   The day before switch to  liquid diet until 4:30 AM day of surgery    CLEAR LIQUID DIET  Foods Allowed                                                                     Foods Excluded  Water, Black Coffee and tea, regular and decaf                             liquids that you cannot  Plain Jell-O in any flavor  (No red)                                           see through such as: Fruit ices (not with fruit pulp)                                     milk, soups, orange juice  Iced Popsicles (No red)                                    All solid food                                   Apple juices Sports drinks like Gatorade (No red) Lightly seasoned clear broth or consume(fat free) Sugar, honey syrup  Sample Menu Breakfast                                Lunch                                     Supper Cranberry juice                    Beef broth                            Chicken broth Jell-O  Grape juice                           Apple juice Coffee or tea                        Jell-O                                      Popsicle                                                Coffee or tea                        Coffee or tea    Oral Hygiene is also important to reduce your risk of infection.                                    Remember - BRUSH YOUR TEETH THE  MORNING OF SURGERY WITH YOUR REGULAR TOOTHPASTE   Do NOT smoke after Midnight   Take these medicines the morning of surgery with A SIP OF WATER: Pantoprazole                               You may not have any metal on your body including hair pins, jewelry, and body piercings             Do not wear make-up, lotions, powders, perfumes/cologne, or deodorant             Do not wear nail polish.  Do not shave  48 hours prior to surgery.                Do not bring valuables to the hospital. [place].   Contacts, dentures or bridgework may not be worn into surgery.   Bring small overnight bag day of surgery.    Patients discharged the day of surgery will not be allowed to drive home.   Special Instructions: Bring a copy of your healthcare power of attorney and living will documents         the day of surgery if you haven't scanned them in before.              Please read over the following fact sheets you were given: IF YOU HAVE QUESTIONS ABOUT YOUR PRE OP INSTRUCTIONS PLEASE CALL [phone]   [place] - Preparing for Surgery Before surgery, you can play an important role.  Because skin is not sterile, your skin needs to be as free of germs as possible.  You can reduce the number of germs on your skin by washing with CHG (chlorahexidine gluconate) soap before surgery.  CHG is an antiseptic cleaner which kills germs and bonds with the skin to continue killing germs even after washing. Please DO NOT use if you have an allergy to CHG or antibacterial soaps.  If your skin becomes reddened/irritated stop using the CHG and  inform your nurse when you arrive at Short Stay. Do not shave (including legs and underarms) for at least 48 hours prior to the first CHG shower.  You may shave your face/neck.  Please follow these instructions carefully:  1.  Shower with CHG Soap the night before surgery and the  morning of surgery.  2.  If you choose to  wash your hair, wash your hair first as usual with your normal  shampoo.  3.  After you shampoo, rinse your hair and body thoroughly to remove the shampoo.                             4.  Use CHG as you would any other liquid soap.  You can apply chg directly to the skin and wash.  Gently with a scrungie or clean washcloth.  5.  Apply the CHG Soap to your body ONLY FROM THE NECK DOWN.   Do   not use on face/ open                           Wound or open sores. Avoid contact with eyes, ears mouth and   genitals (private parts).                       Wash face,  Genitals (private parts) with your normal soap.             6.  Wash thoroughly, paying special attention to the area where your    surgery  will be performed.  7.  Thoroughly rinse your body with warm water from the neck down.  8.  DO NOT shower/wash with your normal soap after using and rinsing off the CHG Soap.                9.  Pat yourself dry with a clean towel.            10.  Wear clean pajamas.            11.  Place clean sheets on your bed the night of your first shower and do not  sleep with pets. Day of Surgery : Do not apply any lotions/deodorants the morning of surgery.  Please wear clean clothes to the hospital/surgery center.  FAILURE TO FOLLOW THESE INSTRUCTIONS MAY RESULT IN THE CANCELLATION OF YOUR SURGERY  PATIENT SIGNATURE_________________________________  NURSE SIGNATURE__________________________________  ________________________________________________________________________   Adam Phenix  An incentive spirometer is a tool that can help keep your lungs clear and active. This tool measures how well you are filling your lungs with each breath. Taking long deep breaths may help reverse or decrease the chance of developing breathing (pulmonary) problems (especially infection) following:  A long period of time when you are unable to move or be active. BEFORE THE PROCEDURE   If the spirometer includes  an indicator to show your best effort, your nurse or respiratory therapist will set it to a desired goal.  If possible, sit up straight or lean slightly forward. Try not to slouch.  Hold the incentive spirometer in an upright position. INSTRUCTIONS FOR USE  1. Sit on the edge of your bed if possible, or sit up as far as you can in bed or on a chair. 2. Hold the incentive spirometer in an upright position. 3. Breathe out normally. 4. Place the mouthpiece in your mouth and seal  your lips tightly around it. 5. Breathe in slowly and as deeply as possible, raising the piston or the ball toward the top of the column. 6. Hold your breath for 3-5 seconds or for as long as possible. Allow the piston or ball to fall to the bottom of the column. 7. Remove the mouthpiece from your mouth and breathe out normally. 8. Rest for a few seconds and repeat Steps 1 through 7 at least 10 times every 1-2 hours when you are awake. Take your time and take a few normal breaths between deep breaths. 9. The spirometer may include an indicator to show your best effort. Use the indicator as a goal to work toward during each repetition. 10. After each set of 10 deep breaths, practice coughing to be sure your lungs are clear. If you have an incision (the cut made at the time of surgery), support your incision when coughing by placing a pillow or rolled up towels firmly against it. Once you are able to get out of bed, walk around indoors and cough well. You may stop using the incentive spirometer when instructed by your caregiver.  RISKS AND COMPLICATIONS  Take your time so you do not get dizzy or light-headed.  If you are in pain, you may need to take or ask for pain medication before doing incentive spirometry. It is harder to take a deep breath if you are having pain. AFTER USE  Rest and breathe slowly and easily.  It can be helpful to keep track of a log of your progress. Your caregiver can provide you with a simple  table to help with this. If you are using the spirometer at home, follow these instructions: [place] IF:   You are having difficultly using the spirometer.  You have trouble using the spirometer as often as instructed.  Your pain medication is not giving enough relief while using the spirometer.  You develop fever of 100.5 F (38.1 C) or higher. SEEK IMMEDIATE MEDICAL CARE IF:   You cough up bloody sputum that had not been present before.  You develop fever of 102 F (38.9 C) or greater.  You develop worsening pain at or near the incision site. MAKE SURE YOU:   Understand these instructions.  Will watch your condition.  Will get help right away if you are not doing well or get worse. Document Released: 02/24/2007 Document Revised: 01/06/2012 Document Reviewed: 04/27/2007 Exit[employer] Patient Information 2014 Exit[employer], [place].   ________________________________________________________________________  WHAT IS A BLOOD TRANSFUSION? Blood Transfusion Information  A transfusion is the replacement of blood or some of its parts. Blood is made up of multiple cells which provide different functions.  Red blood cells carry oxygen and are used for blood loss replacement.  White blood cells fight against infection.  Platelets control bleeding.  Plasma helps clot blood.  Other blood products are available for specialized needs, such as hemophilia or other clotting disorders. BEFORE THE TRANSFUSION  Who gives blood for transfusions?   Healthy volunteers who are fully evaluated to make sure their blood is safe. This is blood bank blood. Transfusion therapy is the safest it has ever been in the practice of medicine. Before blood is taken from a donor, a complete history is taken to make sure that person has no history of diseases nor engages in risky social behavior (examples are intravenous drug use or sexual activity with multiple partners). The donor's travel history is  screened to minimize risk of transmitting infections, such  as malaria. The donated blood is tested for signs of infectious diseases, such as HIV and hepatitis. The blood is then tested to be sure it is compatible with you in order to minimize the chance of a transfusion reaction. If you or a relative donates blood, this is often done in anticipation of surgery and is not appropriate for emergency situations. It takes many days to process the donated blood. RISKS AND COMPLICATIONS Although transfusion therapy is very safe and saves many lives, the main dangers of transfusion include:   Getting an infectious disease.  Developing a transfusion reaction. This is an allergic reaction to something in the blood you were given. Every precaution is taken to prevent this. The decision to have a blood transfusion has been considered carefully by your caregiver before blood is given. Blood is not given unless the benefits outweigh the risks. AFTER THE TRANSFUSION  Right after receiving a blood transfusion, you will usually feel much better and more energetic. This is especially true if your red blood cells have gotten low (anemic). The transfusion raises the level of the red blood cells which carry oxygen, and this usually causes an energy increase.  The nurse administering the transfusion will monitor you carefully for complications. HOME CARE INSTRUCTIONS  No special instructions are needed after a transfusion. You may find your energy is better. Speak with your caregiver about any limitations on activity for underlying diseases you may have. SEEK MEDICAL CARE IF:   Your condition is not improving after your transfusion.  You develop redness or irritation at the intravenous (IV) site. SEEK IMMEDIATE MEDICAL CARE IF:  Any of the following symptoms occur over the next 12 hours:  Shaking chills.  You have a temperature by mouth above 102 F (38.9 C), not controlled by medicine.  Chest, back, or muscle  pain.  People around you feel you are not acting correctly or are confused.  Shortness of breath or difficulty breathing.  Dizziness and fainting.  You get a rash or develop hives.  You have a decrease in urine output.  Your urine turns a dark color or changes to pink, red, or brown. Any of the following symptoms occur over the next 10 days:  You have a temperature by mouth above 102 F (38.9 C), not controlled by medicine.  Shortness of breath.  Weakness after normal activity.  The white part of the eye turns yellow (jaundice).  You have a decrease in the amount of urine or are urinating less often.  Your urine turns a dark color or changes to pink, red, or brown. Document Released: 10/11/2000 Document Revised: 01/06/2012 Document Reviewed: 05/30/2008 [facility] Patient Information 2014 [place], [place].  _______________________________________________________________________

## 2020-04-17 NOTE — Patient Instructions (Signed)
Preparing for your Surgery  Plan for surgery on April 20, 2020 with Dr. Jeral Pinch at [place] will be scheduled for an exploratory laparotomy, unilateral salpingo-oophorectomy, possible staging.   Pre-operative Testing -You will receive a phone call from presurgical testing at [facility] to arrange for a pre-operative appointment over the phone, lab appointment, and COVID test. The COVID test normally happens 3 days prior to the surgery and they ask that you self quarantine after the test up until surgery to decrease chance of exposure.  -Bring your insurance card, copy of an advanced directive if applicable, medication list  -At that visit, you will be asked to sign a consent for a possible blood transfusion in case a transfusion becomes necessary during surgery.  The need for a blood transfusion is rare but having consent is a necessary part of your care.     -You should not be taking blood thinners or aspirin at least ten days prior to surgery unless instructed by your surgeon.  -Do not take supplements such as fish oil (omega 3), red yeast rice, turmeric before your surgery.   Day Before Surgery at [place] will be asked to take in a light diet the day before surgery. You will be advised you can have clear liquids after midnight and up until 3 hours before your surgery.    Eat a light diet the day before surgery.  Examples including soups, broths, toast, yogurt, mashed potatoes.  AVOID GAS PRODUCING FOODS. Things to avoid include carbonated beverages (fizzy beverages), raw fruits and raw vegetables, or beans.   If your bowels are filled with gas, your surgeon will have difficulty visualizing your pelvic organs which increases your surgical risks.  Your role in recovery Your role is to become active as soon as directed by your doctor, while still giving yourself time to heal.  Rest when you feel tired. You will be asked to do the following in order to speed  your recovery:  - Cough and breathe deeply. This helps to clear and expand your lungs and can prevent pneumonia after surgery.  - [place]. Do mild physical activity. Walking or moving your legs help your circulation and body functions return to normal. Do not try to get up or walk alone the first time after surgery.   -If you develop swelling on one leg or the other, pain in the back of your leg, redness/warmth in one of your legs, please call the office or go to the Emergency Room to have a doppler to rule out a blood clot. For shortness of breath, chest pain-seek care in the Emergency Room as soon as possible. - Actively manage your pain. Managing your pain lets you move in comfort. We will ask you to rate your pain on a scale of zero to 10. It is your responsibility to tell your doctor or nurse where and how much you hurt so your pain can be treated.  Special Considerations -If you are diabetic, you may be placed on insulin after surgery to have closer control over your blood sugars to promote healing and recovery.  This does not mean that you will be discharged on insulin.  If applicable, your oral antidiabetics will be resumed when you are tolerating a solid diet.  -Your final pathology results from surgery should be available around one week after surgery and the results will be relayed to you when available.  -Dr. Lahoma Crocker is the surgeon  that assists your GYN Oncologist with surgery.  If you end up staying the night, the next day after your surgery you will either see Dr. Denman George, Dr. Berline Lopes, or Dr. Lahoma Crocker.  -FMLA forms can be faxed to [phone] and please allow 5-7 business days for completion.  Pain Management After Surgery -You will be prescribed pain medication and bowel regimen medications.   -Make sure that you have Tylenol and Ibuprofen at home to use on a regular basis after surgery for pain control. We recommend alternating the  medications every hour to six hours since they work differently and are processed in the body differently for pain relief.  -Review the attached handout on narcotic use and their risks and side effects.   Bowel Regimen -You will be prescribed Sennakot-S to take nightly to prevent constipation especially if you are taking the narcotic pain medication intermittently.  It is important to prevent constipation and drink adequate amounts of liquids. You can stop taking this medication when you are not taking pain medication and you are back on your normal bowel routine.  Risks of Surgery Risks of surgery are low but include bleeding, infection, damage to surrounding structures, re-operation, blood clots, and very rarely death.   Blood Transfusion Information (For the consent to be signed before surgery)  We will be checking your blood type before surgery so in case of emergencies, we will know what type of blood you would need.                                            WHAT IS A BLOOD TRANSFUSION?  A transfusion is the replacement of blood or some of its parts. Blood is made up of multiple cells which provide different functions.  Red blood cells carry oxygen and are used for blood loss replacement.  White blood cells fight against infection.  Platelets control bleeding.  Plasma helps clot blood.  Other blood products are available for specialized needs, such as hemophilia or other clotting disorders. BEFORE THE TRANSFUSION  Who gives blood for transfusions?   You may be able to donate blood to be used at a later date on yourself (autologous donation).  Relatives can be asked to donate blood. This is generally not any safer than if you have received blood from a stranger. The same precautions are taken to ensure safety when a relative's blood is donated.  Healthy volunteers who are fully evaluated to make sure their blood is safe. This is blood bank blood. Transfusion therapy is the  safest it has ever been in the practice of medicine. Before blood is taken from a donor, a complete history is taken to make sure that person has no history of diseases nor engages in risky social behavior (examples are intravenous drug use or sexual activity with multiple partners). The donor's travel history is screened to minimize risk of transmitting infections, such as malaria. The donated blood is tested for signs of infectious diseases, such as HIV and hepatitis. The blood is then tested to be sure it is compatible with you in order to minimize the chance of a transfusion reaction. If you or a relative donates blood, this is often done in anticipation of surgery and is not appropriate for emergency situations. It takes many days to process the donated blood. RISKS AND COMPLICATIONS Although transfusion therapy is very safe and saves many  lives, the main dangers of transfusion include:   Getting an infectious disease.  Developing a transfusion reaction. This is an allergic reaction to something in the blood you were given. Every precaution is taken to prevent this. The decision to have a blood transfusion has been considered carefully by your caregiver before blood is given. Blood is not given unless the benefits outweigh the risks.  AFTER SURGERY INSTRUCTIONS  Return to work: 6 weeks if applicable  Activity: 1. Be up and out of the bed during the day.  Take a nap if needed.  You may walk up steps but be careful and use the hand rail.  Stair climbing will tire you more than you think, you may need to stop part way and rest.   2. No lifting or straining for 6 weeks over 10 pounds. No pushing, pulling, straining for 6 weeks.  3. No driving for 2 week(s).  Do not drive if you are taking narcotic pain medicine and make sure that your reaction time has returned.   4. You can shower as soon as the next day after surgery. Shower daily.  Use soap and water on your incision and pat dry; don't rub.   No tub baths or submerging your body in water until cleared by your surgeon. If you have the soap that was given to you by pre-surgical testing that was used before surgery, you do not need to use it afterwards because this can irritate your incisions.   5. No sexual activity and nothing in the vagina for 4 weeks.  6. You may experience a small amount of clear drainage from your incision, which is normal.  If the drainage persists, increases, or changes color please call the office.  7. Do not use creams, lotions, or ointments such as neosporin on your incisions after surgery until advised by your surgeon because they can cause removal of the dermabond glue on your incisions.    8. You may experience vaginal spotting after surgery. The spotting is normal but if you experience heavy bleeding, call our office.  9. Take Tylenol or ibuprofen first for pain and only use narcotic pain medication for severe pain not relieved by the Tylenol or Ibuprofen.  Monitor your Tylenol intake to a max of 4,000 mg in a 24 hour period. You can alternate these medications after surgery.  Diet: 1. Low sodium Heart Healthy Diet is recommended.  2. It is safe to use a laxative, such as Miralax or Colace, if you have difficulty moving your bowels. You have been prescribed Sennakot at bedtime every evening to keep bowel movements regular and to prevent constipation.    Wound Care: 1. Keep clean and dry.  Shower daily.  Reasons to call the Doctor:  Fever - Oral temperature greater than 100.4 degrees Fahrenheit  Foul-smelling vaginal discharge  Difficulty urinating  Nausea and vomiting  Increased pain at the site of the incision that is unrelieved with pain medicine.  Difficulty breathing with or without chest pain  New calf pain especially if only on one side  Sudden, continuing increased vaginal bleeding with or without clots.   Contacts: For questions or concerns you should contact:  Dr. Jeral Pinch at [phone]  Joylene John, NP at [phone]  After Hours: call [phone] and have the GYN Oncologist paged/contacted

## 2020-04-17 NOTE — Progress Notes (Addendum)
COVID Vaccine Completed: No Date COVID Vaccine completed:02/03/2020 COVID vaccine manufacturer:   Moderna   PCP - Dr. Lucilla Lame Cardiologist - N/A  Chest x-ray - greater 1 year EKG - greater 1 year Stress Test - N/A ECHO - N/A Cardiac Cath - N/A  Sleep Study - N/A CPAP - N/A  Fasting Blood Sugar - N/A Checks Blood Sugar _N/A____ times a day  Blood Thinner Instructions:N/A Aspirin Instructions:N/A Last Dose:N/A  Anesthesia review: N/A  Patient denies shortness of breath, fever, cough and chest pain at PAT appointment   Patient verbalized understanding of instructions that were given to them at the PAT appointment. Patient was also instructed that they will need to review over the PAT instructions again at home before surgery.

## 2020-04-17 NOTE — H&P (View-Only) (Signed)
GYNECOLOGIC ONCOLOGY NEW PATIENT CONSULTATION   Patient Name: Paula Evans  Patient Age: 34 y.o. Date of Service: 04/17/20 Referring Provider: Dr. Cletis Media  Primary Care Provider: Jinny Sanders, MD Consulting Provider: Jeral Pinch, MD   Assessment/Plan:  Premenopausal patient with a large complex pelvic mass and ascites.  Discussed CT findings and possible etiologies including a benign ovarian mass (ascites could be attributed to spontaneous mass rupture), borderline tumor, and malignancy.  The patient understands that definitive surgery is the only way to get a pathologic diagnosis.  Most of her tumor markers have resulted in the only abnormal marker is her Ca1 25.  We discussed that this is not a very sensitive or specific marker.  It is only mildly elevated in a premenopausal patient (in the 80s) and is difficult to interpret.    She is currently scheduled for surgery on Thursday.  Given her large volume ascites as well as the size of this mass, I recommend that we proceed with exploratory laparotomy.  I will drain her ascites and plan to remove the enlarged adnexa and sent for pathologic review while she is asleep.  If no malignancy is identified, then I will leave her other tube and ovary as well as uterus in situ.  We discussed the possibility of a borderline tumor.  While she is not interested in childbearing at this time.  We discussed that if she were to meet a partner, that she could imagine a scenario where she would want to have children.  I think given this, we discussed fertility sparing in the setting of a borderline tumor with the plan for completion surgery after she finished childbearing.  If malignancy is encountered, then we discussed surgical staging with the goal of fertility preservation.  We discussed that this would mean taking biopsies of pelvic and para-aortic lymph nodes as well as removing part of her omentum.    Discussed the plan for exploratory laparotomy,  unilateral salpingo-oophorectomy, possible staging to include contralateral salpingo-oophorectomy, total hysterectomy, pelvic and periaortic lymph node dissection, omentectomy, tumor debulking, and any other indicated procedures. The risks of surgery were discussed in detail and she understands these to include infection; wound separation; hernia; vaginal cuff separation, injury to adjacent organs such as bowel, bladder, blood vessels, ureters and nerves; bleeding which may require blood transfusion; anesthesia risk; thromboembolic events; possible death; unforeseen complications; possible need for re-exploration; medical complications such as heart attack, stroke, pleural effusion and pneumonia; and, if full lymphadenectomy is performed the risk of lymphedema and lymphocyst. The patient will receive DVT and antibiotic prophylaxis as indicated. She voiced a clear understanding. She had the opportunity to ask questions. Perioperative instructions were reviewed with her. Prescriptions for post-op medications were sent to her pharmacy of choice.  A copy of this note was sent to the patient's referring provider.   Jeral Pinch, MD  Division of Gynecologic Oncology  Department of Obstetrics and Gynecology  [facility]  ___________________________________________  Chief Complaint: Chief Complaint  Patient presents with  . Adnexal mass    New Patient    History of Present Illness:  Paula Evans is a 34 y.o. y.o. female who is seen in consultation at the request of Dr. Cletis Media for an evaluation of large pelvic mass and ascites.   The patient was initially seen in late February with abdominal bloating and stool changes.  She had a history of heartburn that had worsened recently.  Patient was suspected to have IBS as  well as bloating secondary to food choices.  She was referred to GI and was seen there at the beginning of April.  Endoscopy was recommended.  She continued to  have worsening symptoms with GERD and episodes of emesis.  Ultimately, she presented to an outside emergency department on 6/18 with abdominal pain for 5 days, emesis for 2 days, abdominal bloating and constipation.  Imaging showed a large pelvic mass with significant ascites.  She was ultimately transferred from [facility] ED to [facility] for evaluation.  She was observed overnight and both her abdominal pain and nausea were able to be controlled with p.o. medications.  She was discharged home on Saturday.  Since being home, she notes overall that her symptoms have been better controlled.  She was mostly comfortable yesterday but today has had some increasing nausea and abdominal pain.  She describes having diffuse abdominal discomfort, feelings of heartburn, and the sensation that she is short of breath.  She also endorses back pain and lower extremity edema, which she first noticed while traveling in [place] last month.  She notes having a good appetite although is not able to eat much secondary to early satiety.  She has struggled with constipation and is currently using stool softeners twice a day, prunes and suppositories as needed.  She was discharged home from the hospital with naproxen, Tylenol and Dilaudid.  She has been using Dilaudid as needed for pain.  Patient's gynecologic history is notable for robotic myomectomy for uterine fibroids as well as a history of cervical dysplasia.  She works as a traveling IT consultant.  Last menstrual period was from 6/15-20.  She is not currently sexually active.  In the past, she thought she wanted kids.  At this time, she is not interested in childbearing.  PAST MEDICAL HISTORY:  Past Medical History:  Diagnosis Date  . Abnormal Pap smear of cervix   . Anxiety   . Depression   . Dysmenorrhea   . GERD (gastroesophageal reflux disease)    just occasionally - diet controlled  . History of chicken pox   . Irritable bowel syndrome (IBS)   . Major  depression   . PONV (postoperative nausea and vomiting)      PAST SURGICAL HISTORY:  Past Surgical History:  Procedure Laterality Date  . CERVICAL BIOPSY  W/ LOOP ELECTRODE EXCISION    . COLPOSCOPY    . ROBOT ASSISTED MYOMECTOMY N/A 04/02/2017   Procedure: ROBOTIC ASSISTED MYOMECTOMY with Morcellation;  Surgeon: Delsa Bern, MD;  Location: [facility];  Service: Gynecology;  Laterality: N/A;  . WISDOM TOOTH EXTRACTION  2008    OB/GYN HISTORY:  OB History  Gravida Para Term Preterm AB Living  0 0 0 0 0 0  SAB TAB Ectopic Multiple Live Births  0 0 0 0      No LMP recorded.  Age at menarche: 14 Last pap: 2018 History of abnormal pap smears: yes, s/p LEEP in 2016  SCREENING STUDIES:  Last mammogram: 2021  Last colonoscopy: n/a, had upper endoscopy in 01/2020  MEDICATIONS: Outpatient Encounter Medications as of 04/17/2020  Medication Sig  . acetaminophen (TYLENOL) 500 MG tablet Take 2 tablets (1,000 mg total) by mouth every 6 (six) hours as needed for moderate pain.  Marland Kitchen escitalopram (LEXAPRO) 10 MG tablet Take 1 tablet (10 mg total) by mouth daily.  Marland Kitchen HYDROmorphone (DILAUDID) 2 MG tablet Take 1 tablet (2 mg total) by mouth every 4 (four) hours as needed for severe pain.  Marland Kitchen  naproxen sodium (ANAPROX) 550 MG tablet Take 1 tablet (550 mg total) by mouth 2 (two) times daily with a meal.  . ondansetron (ZOFRAN) 4 MG tablet Take 1 tablet (4 mg total) by mouth every 6 (six) hours as needed for nausea.  . pantoprazole (PROTONIX) 40 MG tablet Take 1 tablet (40 mg total) by mouth daily. Pharmacy-please d/c rx for 30 day script  . simethicone (MYLICON) 80 MG chewable tablet Chew 1 tablet (80 mg total) by mouth 4 (four) times daily as needed for flatulence (gas).  Marland Kitchen HYDROmorphone (DILAUDID) 2 MG tablet Take 1 tablet (2 mg total) by mouth every 4 (four) hours as needed for severe pain.  . [DISCONTINUED] Cyanocobalamin (B-12) 500 MCG TABS  (Patient not taking: Reported on 04/14/2020)  .  [DISCONTINUED] Multiple Vitamins-Minerals (WOMENS MULTI VITAMIN & MINERAL) TABS  (Patient not taking: Reported on 04/14/2020)  . [DISCONTINUED] sodium chloride flush (NS) 0.9 % injection 3 mL    No facility-administered encounter medications on file as of 04/17/2020.    ALLERGIES:  No Known Allergies   FAMILY HISTORY:  Family History  Problem Relation Age of Onset  . Hyperlipidemia Mother   . Alcohol abuse Father   . Post-traumatic stress disorder Father   . Drug abuse Father   . Lung cancer Paternal Uncle   . Diabetes Paternal Uncle   . Breast cancer Maternal Grandmother   . Colon polyps Paternal Grandmother   . Cancer Maternal Aunt        ovarian  . Colon cancer Neg Hx   . Esophageal cancer Neg Hx   . Rectal cancer Neg Hx   . Stomach cancer Neg Hx      SOCIAL HISTORY:    Social Connections:   . Frequency of Communication with Friends and Family:   . Frequency of Social Gatherings with Friends and Family:   . Attends Religious Services:   . Active Member of Clubs or Organizations:   . Attends Archivist Meetings:   Marland Kitchen Marital Status:     REVIEW OF SYSTEMS:  Denies appetite changes, fevers, chills, fatigue, unexplained weight changes. Denies hearing loss, neck lumps or masses, mouth sores, ringing in ears or voice changes. Denies cough or wheezing.  Denies shortness of breath. Denies chest pain or palpitations. Denies leg swelling. Denies abdominal distention, pain, blood in stools, constipation, diarrhea, nausea, vomiting, or early satiety. Denies pain with intercourse, dysuria, frequency, hematuria or incontinence. Denies hot flashes, pelvic pain, vaginal bleeding or vaginal discharge.   Denies joint pain, back pain or muscle pain/cramps. Denies itching, rash, or wounds. Denies dizziness, headaches, numbness or seizures. Denies swollen lymph nodes or glands, denies easy bruising or bleeding. Denies anxiety, depression, confusion, or decreased  concentration.  Physical Exam:  Vital Signs for this encounter:  Blood pressure 120/82, pulse 86, temperature 98.6 F (37 C), temperature source Oral, resp. rate 16, height 5\' 4"  (1.626 m), weight 222 lb 2 oz (100.8 kg), SpO2 100 %. Body mass index is 38.13 kg/m. General: Alert, oriented, no acute distress.  HEENT: Normocephalic, atraumatic. Sclera anicteric.  Chest: Clear to auscultation bilaterally. No wheezes, rhonchi, or rales.  Somewhat short of breath while talking to me. Cardiovascular: Regular rate and rhythm, no murmurs, rubs, or gallops.  Abdomen: Tense, distended, diffusely mildly tender to palpation. No discrete masses or hepatosplenomegaly appreciated. + fluid wave. Extremities: Grossly normal range of motion. Warm, well perfused. Trace edema bilaterally.  Skin: No rashes or lesions.  Lymphatics: No cervical, supraclavicular, or  inguinal adenopathy.  GU:  Normal external female genitalia.  No lesions. No discharge or bleeding.             Speculum exam deferred. On bimanual, uterus deviated anteriorly, moderately mobile. Large mass fills the pelvis, no nodularity appreciated on RV exam.  Fullness consistent with ascites appreciated.  LABORATORY AND RADIOLOGIC DATA:  Outside medical records were reviewed to synthesize the above history, along with the history and physical obtained during the visit.   Lab Results  Component Value Date   WBC 9.4 04/14/2020   HGB 14.8 04/14/2020   HCT 42.7 04/14/2020   PLT 335 04/14/2020   GLUCOSE 95 04/14/2020   CHOL 154 05/17/2019   TRIG 101.0 05/17/2019   HDL 52.00 05/17/2019   LDLCALC 81 05/17/2019   ALT 13 04/14/2020   AST 20 04/14/2020   NA 134 (L) 04/14/2020   K 3.6 04/14/2020   CL 102 04/14/2020   CREATININE 0.95 04/14/2020   BUN 14 04/14/2020   CO2 23 04/14/2020   CEA: 1.2 AFP: 1.5 Inhibin B: pending CA-125: 86.4 Hcg: <1  CT A/P on 6/18: FINDINGS: Lower chest: Normal.  Hepatobiliary: No focal liver abnormality is  seen. No gallstones, gallbladder wall thickening, or biliary dilatation.  Pancreas: Unremarkable. No pancreatic ductal dilatation or surrounding inflammatory changes.  Spleen: Normal in size without focal abnormality.  Adrenals/Urinary Tract: Adrenal glands are unremarkable. Kidneys are normal, without renal calculi, focal lesion, or hydronephrosis. Bladder is unremarkable.  Stomach/Bowel: Small hiatal hernia. Stomach is otherwise normal. Appendix is not visualized. No evidence of bowel wall thickening, distention, or inflammatory changes.  Vascular/Lymphatic: No significant vascular findings are present. No enlarged abdominal or pelvic lymph nodes.  Reproductive: There is a 19 x 18 x 13 cm complex mixed solid and cystic mass arising from the right adnexa, consistent with ovarian carcinoma. 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 The uterus is inhomogeneous with multiple small lesions which are likely to represent fibroids. The right ovarian  Tumor deviates the uterus anteriorly and to the left.  Other: There is extensive ascites throughout the abdomen. The ascites extends into the middle mediastinum adjacent to the distal esophagus.  Musculoskeletal: No acute abnormality. Bilateral pars defects at L5.  IMPRESSION: 1. 19 x 18 x 13 cm complex mixed solid and cystic mass arising from the right adnexa consistent with ovarian carcinoma. 2. 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 3. Extensive ascites throughout the abdomen likely secondary to the ovarian tumor.

## 2020-04-18 LAB — INHIBIN B: Inhibin B: 14.7 pg/mL

## 2020-04-18 LAB — ABO/RH: ABO/RH(D): O POS

## 2020-04-19 ENCOUNTER — Telehealth: Payer: Self-pay

## 2020-04-19 NOTE — Telephone Encounter (Signed)
Spoke with patient to see if she understood written pre op instructions.  She had no questions or concerns at this time.  Explained to patient that she is able to eat a light diet such as eggs or mashed potatoes and then after midnight proceed to liquid diet until 4:30 am.  Patient voiced understanding of above.  No questions/concerns.

## 2020-04-19 NOTE — Anesthesia Preprocedure Evaluation (Addendum)
Anesthesia Evaluation  Patient identified by MRN, date of birth, ID band Patient awake    Reviewed: Allergy & Precautions, NPO status , Patient's Chart, lab work & pertinent test results  History of Anesthesia Complications (+) PONV and history of anesthetic complications  Airway Mallampati: II  TM Distance: >3 FB Neck ROM: Full    Dental  (+) Dental Advisory Given, Teeth Intact   Pulmonary former smoker,    Pulmonary exam normal        Cardiovascular negative cardio ROS Normal cardiovascular exam     Neuro/Psych PSYCHIATRIC DISORDERS Anxiety Depression Bipolar Disorder negative neurological ROS     GI/Hepatic Neg liver ROS, GERD  Controlled and Medicated, IBS   Endo/Other   Obesity   Renal/GU negative Renal ROS     Musculoskeletal negative musculoskeletal ROS (+)   Abdominal   Peds  Hematology negative hematology ROS (+)   Anesthesia Other Findings Covid test negative   Reproductive/Obstetrics  Ovarian mass                             Anesthesia Physical Anesthesia Plan  ASA: II  Anesthesia Plan: General   Post-op Pain Management:    Induction: Intravenous  PONV Risk Score and Plan: 4 or greater and Treatment may vary due to age or medical condition, Ondansetron, Midazolam, Dexamethasone and Scopolamine patch - Pre-op  Airway Management Planned: Oral ETT  Additional Equipment: None  Intra-op Plan:   Post-operative Plan: Extubation in OR  Informed Consent: I have reviewed the patients History and Physical, chart, labs and discussed the procedure including the risks, benefits and alternatives for the proposed anesthesia with the patient or authorized representative who has indicated his/her understanding and acceptance.     Dental advisory given  Plan Discussed with: CRNA and Anesthesiologist  Anesthesia Plan Comments:        Anesthesia Quick Evaluation

## 2020-04-20 ENCOUNTER — Other Ambulatory Visit: Payer: Self-pay

## 2020-04-20 ENCOUNTER — Observation Stay (HOSPITAL_COMMUNITY)
Admission: RE | Admit: 2020-04-20 | Discharge: 2020-04-23 | Disposition: A | Discharge status: Home / Self Care | Payer: BC Managed Care – PPO | Attending: Gynecologic Oncology | Admitting: Gynecologic Oncology

## 2020-04-20 ENCOUNTER — Encounter (HOSPITAL_COMMUNITY): Payer: Self-pay | Admitting: Gynecologic Oncology

## 2020-04-20 ENCOUNTER — Ambulatory Visit (HOSPITAL_COMMUNITY): Payer: BC Managed Care – PPO | Admitting: Certified Registered"

## 2020-04-20 ENCOUNTER — Encounter (HOSPITAL_COMMUNITY)
Admission: RE | Disposition: A | Discharge status: Home / Self Care | Payer: Self-pay | Source: Home / Self Care | Attending: Gynecologic Oncology

## 2020-04-20 DIAGNOSIS — N3289 Other specified disorders of bladder: Secondary | ICD-10-CM

## 2020-04-20 DIAGNOSIS — Z87410 Personal history of cervical dysplasia: Secondary | ICD-10-CM | POA: Diagnosis not present

## 2020-04-20 DIAGNOSIS — D259 Leiomyoma of uterus, unspecified: Secondary | ICD-10-CM | POA: Diagnosis not present

## 2020-04-20 DIAGNOSIS — Z20822 Contact with and (suspected) exposure to covid-19: Secondary | ICD-10-CM | POA: Diagnosis not present

## 2020-04-20 DIAGNOSIS — Z79899 Other long term (current) drug therapy: Secondary | ICD-10-CM | POA: Diagnosis not present

## 2020-04-20 DIAGNOSIS — D62 Acute posthemorrhagic anemia: Secondary | ICD-10-CM

## 2020-04-20 DIAGNOSIS — Z801 Family history of malignant neoplasm of trachea, bronchus and lung: Secondary | ICD-10-CM | POA: Diagnosis not present

## 2020-04-20 DIAGNOSIS — N390 Urinary tract infection, site not specified: Secondary | ICD-10-CM | POA: Diagnosis not present

## 2020-04-20 DIAGNOSIS — Z8041 Family history of malignant neoplasm of ovary: Secondary | ICD-10-CM | POA: Diagnosis not present

## 2020-04-20 DIAGNOSIS — D271 Benign neoplasm of left ovary: Secondary | ICD-10-CM | POA: Insufficient documentation

## 2020-04-20 DIAGNOSIS — D251 Intramural leiomyoma of uterus: Secondary | ICD-10-CM | POA: Insufficient documentation

## 2020-04-20 DIAGNOSIS — R188 Other ascites: Secondary | ICD-10-CM | POA: Diagnosis not present

## 2020-04-20 DIAGNOSIS — Z791 Long term (current) use of non-steroidal anti-inflammatories (NSAID): Secondary | ICD-10-CM | POA: Diagnosis not present

## 2020-04-20 DIAGNOSIS — K219 Gastro-esophageal reflux disease without esophagitis: Secondary | ICD-10-CM | POA: Diagnosis not present

## 2020-04-20 DIAGNOSIS — G5602 Carpal tunnel syndrome, left upper limb: Secondary | ICD-10-CM | POA: Diagnosis not present

## 2020-04-20 DIAGNOSIS — Z87891 Personal history of nicotine dependence: Secondary | ICD-10-CM | POA: Diagnosis not present

## 2020-04-20 DIAGNOSIS — R970 Elevated carcinoembryonic antigen [CEA]: Secondary | ICD-10-CM | POA: Insufficient documentation

## 2020-04-20 DIAGNOSIS — D261 Other benign neoplasm of corpus uteri: Principal | ICD-10-CM | POA: Insufficient documentation

## 2020-04-20 DIAGNOSIS — D215 Benign neoplasm of connective and other soft tissue of pelvis: Secondary | ICD-10-CM | POA: Diagnosis not present

## 2020-04-20 DIAGNOSIS — R1900 Intra-abdominal and pelvic swelling, mass and lump, unspecified site: Secondary | ICD-10-CM

## 2020-04-20 DIAGNOSIS — Z803 Family history of malignant neoplasm of breast: Secondary | ICD-10-CM | POA: Insufficient documentation

## 2020-04-20 HISTORY — PX: BILATERAL SALPINGECTOMY: SHX5743

## 2020-04-20 LAB — CBC
HCT: 27.2 % — ABNORMAL LOW (ref 36.0–46.0)
Hemoglobin: 9 g/dL — ABNORMAL LOW (ref 12.0–15.0)
MCH: 28.8 pg (ref 26.0–34.0)
MCHC: 33.1 g/dL (ref 30.0–36.0)
MCV: 86.9 fL (ref 80.0–100.0)
Platelets: 286 10*3/uL (ref 150–400)
RBC: 3.13 MIL/uL — ABNORMAL LOW (ref 3.87–5.11)
RDW: 13.2 % (ref 11.5–15.5)
WBC: 14.1 10*3/uL — ABNORMAL HIGH (ref 4.0–10.5)
nRBC: 0 % (ref 0.0–0.2)

## 2020-04-20 LAB — PREGNANCY, URINE: Preg Test, Ur: NEGATIVE

## 2020-04-20 SURGERY — SALPINGECTOMY, BILATERAL, OPEN
Anesthesia: General

## 2020-04-20 MED ORDER — BUPIVACAINE HCL 0.25 % IJ SOLN
INTRAMUSCULAR | Status: AC
  Filled 2020-04-20: qty 1

## 2020-04-20 MED ORDER — DEXAMETHASONE SODIUM PHOSPHATE 10 MG/ML IJ SOLN
INTRAMUSCULAR | Status: AC
  Filled 2020-04-20: qty 1

## 2020-04-20 MED ORDER — SIMETHICONE 80 MG PO CHEW
80.0000 mg | CHEWABLE_TABLET | Freq: Four times a day (QID) | ORAL | Status: DC | PRN
  Administered 2020-04-21: 80 mg via ORAL
  Filled 2020-04-20: qty 1

## 2020-04-20 MED ORDER — ONDANSETRON HCL 4 MG/2ML IJ SOLN
INTRAMUSCULAR | Status: AC
  Filled 2020-04-20: qty 2

## 2020-04-20 MED ORDER — ENSURE ENLIVE PO LIQD
237.0000 mL | Freq: Two times a day (BID) | ORAL | Status: DC
  Administered 2020-04-20 – 2020-04-21 (×3): 237 mL via ORAL

## 2020-04-20 MED ORDER — ENOXAPARIN SODIUM 40 MG/0.4ML ~~LOC~~ SOLN
40.0000 mg | SUBCUTANEOUS | Status: DC
  Filled 2020-04-20: qty 0.4

## 2020-04-20 MED ORDER — FENTANYL CITRATE (PF) 100 MCG/2ML IJ SOLN
INTRAMUSCULAR | Status: DC | PRN
  Administered 2020-04-20 (×2): 100 ug via INTRAVENOUS
  Administered 2020-04-20: 50 ug via INTRAVENOUS

## 2020-04-20 MED ORDER — ALBUMIN HUMAN 5 % IV SOLN: INTRAVENOUS | Status: DC | PRN

## 2020-04-20 MED ORDER — KETAMINE HCL 10 MG/ML IJ SOLN
INTRAMUSCULAR | Status: DC | PRN
  Administered 2020-04-20: 25 mg via INTRAVENOUS

## 2020-04-20 MED ORDER — KETOROLAC TROMETHAMINE 15 MG/ML IJ SOLN
INTRAMUSCULAR | Status: AC
  Administered 2020-04-20: 15 mg via INTRAVENOUS
  Filled 2020-04-20: qty 1

## 2020-04-20 MED ORDER — ONDANSETRON HCL 4 MG PO TABS: 4.0000 mg | ORAL_TABLET | Freq: Four times a day (QID) | ORAL | Status: DC | PRN

## 2020-04-20 MED ORDER — FENTANYL CITRATE (PF) 100 MCG/2ML IJ SOLN
INTRAMUSCULAR | Status: AC
  Administered 2020-04-20: 50 ug via INTRAVENOUS
  Filled 2020-04-20: qty 2

## 2020-04-20 MED ORDER — IBUPROFEN 400 MG PO TABS
600.0000 mg | ORAL_TABLET | Freq: Four times a day (QID) | ORAL | Status: DC
  Administered 2020-04-20 – 2020-04-21 (×4): 600 mg via ORAL
  Filled 2020-04-20 (×4): qty 1

## 2020-04-20 MED ORDER — SODIUM CHLORIDE (PF) 0.9 % IJ SOLN
INTRAMUSCULAR | Status: AC
  Filled 2020-04-20: qty 20

## 2020-04-20 MED ORDER — SODIUM CHLORIDE 0.9 % IV SOLN: INTRAVENOUS | Status: DC | PRN

## 2020-04-20 MED ORDER — KCL IN DEXTROSE-NACL 20-5-0.45 MEQ/L-%-% IV SOLN
INTRAVENOUS | Status: AC
  Filled 2020-04-20: qty 1000

## 2020-04-20 MED ORDER — CHEWING GUM (ORBIT) SUGAR FREE
1.0000 | CHEWING_GUM | Freq: Three times a day (TID) | ORAL | Status: DC
  Administered 2020-04-20 – 2020-04-23 (×8): 1 via ORAL
  Filled 2020-04-20: qty 1

## 2020-04-20 MED ORDER — PANTOPRAZOLE SODIUM 40 MG PO TBEC
40.0000 mg | DELAYED_RELEASE_TABLET | Freq: Every day | ORAL | Status: DC
  Administered 2020-04-21: 40 mg via ORAL
  Filled 2020-04-20: qty 1

## 2020-04-20 MED ORDER — GABAPENTIN 300 MG PO CAPS
300.0000 mg | ORAL_CAPSULE | ORAL | Status: DC
  Filled 2020-04-20: qty 1

## 2020-04-20 MED ORDER — DEXAMETHASONE SODIUM PHOSPHATE 4 MG/ML IJ SOLN
4.0000 mg | INTRAMUSCULAR | Status: AC
  Administered 2020-04-20: 10 mg via INTRAVENOUS

## 2020-04-20 MED ORDER — CHLORHEXIDINE GLUCONATE 0.12 % MT SOLN
15.0000 mL | Freq: Once | OROMUCOSAL | Status: AC
  Administered 2020-04-20: 15 mL via OROMUCOSAL

## 2020-04-20 MED ORDER — LIDOCAINE 20MG/ML (2%) 15 ML SYRINGE OPTIME
INTRAMUSCULAR | Status: DC | PRN
  Administered 2020-04-20: 1.5 mg/kg/h via INTRAVENOUS

## 2020-04-20 MED ORDER — FENTANYL CITRATE (PF) 250 MCG/5ML IJ SOLN
INTRAMUSCULAR | Status: AC
  Filled 2020-04-20: qty 5

## 2020-04-20 MED ORDER — OXYCODONE HCL 5 MG PO TABS
5.0000 mg | ORAL_TABLET | ORAL | Status: DC | PRN
  Administered 2020-04-21: 5 mg via ORAL
  Filled 2020-04-20: qty 1

## 2020-04-20 MED ORDER — CELECOXIB 200 MG PO CAPS
400.0000 mg | ORAL_CAPSULE | ORAL | Status: DC
  Filled 2020-04-20: qty 2

## 2020-04-20 MED ORDER — PHENYLEPHRINE 40 MCG/ML (10ML) SYRINGE FOR IV PUSH (FOR BLOOD PRESSURE SUPPORT)
PREFILLED_SYRINGE | INTRAVENOUS | Status: DC | PRN
  Administered 2020-04-20: 80 ug via INTRAVENOUS
  Administered 2020-04-20 (×3): 120 ug via INTRAVENOUS
  Administered 2020-04-20: 80 ug via INTRAVENOUS

## 2020-04-20 MED ORDER — ORAL CARE MOUTH RINSE: 15.0000 mL | Freq: Once | OROMUCOSAL | Status: AC

## 2020-04-20 MED ORDER — LIDOCAINE 2% (20 MG/ML) 5 ML SYRINGE
INTRAMUSCULAR | Status: DC | PRN
  Administered 2020-04-20: 100 mg via INTRAVENOUS

## 2020-04-20 MED ORDER — ONDANSETRON HCL 4 MG/2ML IJ SOLN
INTRAMUSCULAR | Status: DC | PRN
  Administered 2020-04-20 (×2): 4 mg via INTRAVENOUS

## 2020-04-20 MED ORDER — KCL IN DEXTROSE-NACL 20-5-0.45 MEQ/L-%-% IV SOLN
INTRAVENOUS | Status: DC
  Filled 2020-04-20 (×2): qty 1000

## 2020-04-20 MED ORDER — KETAMINE HCL 10 MG/ML IJ SOLN
INTRAMUSCULAR | Status: AC
  Filled 2020-04-20: qty 1

## 2020-04-20 MED ORDER — ROCURONIUM BROMIDE 10 MG/ML (PF) SYRINGE
PREFILLED_SYRINGE | INTRAVENOUS | Status: DC | PRN
  Administered 2020-04-20: 50 mg via INTRAVENOUS
  Administered 2020-04-20: 30 mg via INTRAVENOUS
  Administered 2020-04-20: 20 mg via INTRAVENOUS

## 2020-04-20 MED ORDER — ESCITALOPRAM OXALATE 10 MG PO TABS
10.0000 mg | ORAL_TABLET | Freq: Every day | ORAL | Status: DC
  Administered 2020-04-21 – 2020-04-23 (×2): 10 mg via ORAL
  Filled 2020-04-20 (×3): qty 1

## 2020-04-20 MED ORDER — SENNOSIDES-DOCUSATE SODIUM 8.6-50 MG PO TABS
2.0000 | ORAL_TABLET | Freq: Every day | ORAL | Status: DC
  Administered 2020-04-20 – 2020-04-22 (×3): 2 via ORAL
  Filled 2020-04-20 (×3): qty 2

## 2020-04-20 MED ORDER — PROPOFOL 10 MG/ML IV BOLUS
INTRAVENOUS | Status: DC | PRN
  Administered 2020-04-20: 200 mg via INTRAVENOUS

## 2020-04-20 MED ORDER — SCOPOLAMINE 1 MG/3DAYS TD PT72
1.0000 | MEDICATED_PATCH | TRANSDERMAL | Status: DC
  Administered 2020-04-20: 1.5 mg via TRANSDERMAL
  Filled 2020-04-20: qty 1

## 2020-04-20 MED ORDER — NON FORMULARY: 1.0000 [IU] | Freq: Three times a day (TID) | Status: DC

## 2020-04-20 MED ORDER — LACTATED RINGERS IV SOLN: INTRAVENOUS | Status: DC

## 2020-04-20 MED ORDER — KETOROLAC TROMETHAMINE 15 MG/ML IJ SOLN
15.0000 mg | Freq: Four times a day (QID) | INTRAMUSCULAR | Status: AC
  Administered 2020-04-20 – 2020-04-21 (×3): 15 mg via INTRAVENOUS
  Filled 2020-04-20 (×3): qty 1

## 2020-04-20 MED ORDER — BUPIVACAINE HCL 0.25 % IJ SOLN
INTRAMUSCULAR | Status: DC | PRN
  Administered 2020-04-20: 50 mL

## 2020-04-20 MED ORDER — HYDROMORPHONE HCL 1 MG/ML IJ SOLN
INTRAMUSCULAR | Status: AC
  Filled 2020-04-20: qty 1

## 2020-04-20 MED ORDER — PROPOFOL 500 MG/50ML IV EMUL
INTRAVENOUS | Status: DC | PRN
Start: 2020-04-20 — End: 2020-04-20
  Administered 2020-04-20: 25 ug/kg/min via INTRAVENOUS

## 2020-04-20 MED ORDER — OXYCODONE HCL 5 MG/5ML PO SOLN: 5.0000 mg | Freq: Once | ORAL | Status: DC | PRN

## 2020-04-20 MED ORDER — PROPOFOL 500 MG/50ML IV EMUL
INTRAVENOUS | Status: AC
  Filled 2020-04-20: qty 50

## 2020-04-20 MED ORDER — ONDANSETRON HCL 4 MG/2ML IJ SOLN
4.0000 mg | Freq: Four times a day (QID) | INTRAMUSCULAR | Status: DC | PRN
  Administered 2020-04-22 (×3): 4 mg via INTRAVENOUS
  Filled 2020-04-20 (×3): qty 2

## 2020-04-20 MED ORDER — KETOROLAC TROMETHAMINE 30 MG/ML IJ SOLN
INTRAMUSCULAR | Status: AC
  Filled 2020-04-20: qty 1

## 2020-04-20 MED ORDER — OXYCODONE HCL 5 MG PO TABS: 5.0000 mg | ORAL_TABLET | Freq: Once | ORAL | Status: DC | PRN

## 2020-04-20 MED ORDER — PROPOFOL 10 MG/ML IV BOLUS
INTRAVENOUS | Status: AC
  Filled 2020-04-20: qty 40

## 2020-04-20 MED ORDER — MIDAZOLAM HCL 5 MG/5ML IJ SOLN
INTRAMUSCULAR | Status: DC | PRN
  Administered 2020-04-20: 2 mg via INTRAVENOUS

## 2020-04-20 MED ORDER — BUPIVACAINE LIPOSOME 1.3 % IJ SUSP
20.0000 mL | Freq: Once | INTRAMUSCULAR | Status: DC
  Filled 2020-04-20: qty 20

## 2020-04-20 MED ORDER — FENTANYL CITRATE (PF) 100 MCG/2ML IJ SOLN
25.0000 ug | INTRAMUSCULAR | Status: DC | PRN
  Administered 2020-04-20: 50 ug via INTRAVENOUS

## 2020-04-20 MED ORDER — LIDOCAINE HCL 2 % IJ SOLN
INTRAMUSCULAR | Status: AC
  Filled 2020-04-20: qty 20

## 2020-04-20 MED ORDER — CEFAZOLIN SODIUM-DEXTROSE 2-4 GM/100ML-% IV SOLN
2.0000 g | INTRAVENOUS | Status: AC
  Administered 2020-04-20: 2 g via INTRAVENOUS
  Filled 2020-04-20: qty 100

## 2020-04-20 MED ORDER — MIDAZOLAM HCL 2 MG/2ML IJ SOLN
INTRAMUSCULAR | Status: AC
  Filled 2020-04-20: qty 2

## 2020-04-20 MED ORDER — ROCURONIUM BROMIDE 10 MG/ML (PF) SYRINGE
PREFILLED_SYRINGE | INTRAVENOUS | Status: AC
  Filled 2020-04-20: qty 10

## 2020-04-20 MED ORDER — HYDROMORPHONE HCL 1 MG/ML IJ SOLN
0.5000 mg | INTRAMUSCULAR | Status: DC | PRN
  Administered 2020-04-20 – 2020-04-22 (×4): 0.5 mg via INTRAVENOUS
  Filled 2020-04-20 (×4): qty 0.5

## 2020-04-20 MED ORDER — ALBUMIN HUMAN 5 % IV SOLN
INTRAVENOUS | Status: AC
  Filled 2020-04-20: qty 250

## 2020-04-20 MED ORDER — SUGAMMADEX SODIUM 200 MG/2ML IV SOLN
INTRAVENOUS | Status: DC | PRN
  Administered 2020-04-20: 200 mg via INTRAVENOUS

## 2020-04-20 MED ORDER — HEPARIN SODIUM (PORCINE) 5000 UNIT/ML IJ SOLN
5000.0000 [IU] | INTRAMUSCULAR | Status: AC
  Administered 2020-04-20: 5000 [IU] via SUBCUTANEOUS
  Filled 2020-04-20: qty 1

## 2020-04-20 MED ORDER — HYDROMORPHONE HCL 1 MG/ML IJ SOLN
INTRAMUSCULAR | Status: AC
  Administered 2020-04-20: 0.5 mg via INTRAVENOUS
  Filled 2020-04-20: qty 1

## 2020-04-20 MED ORDER — HYDROMORPHONE HCL 1 MG/ML IJ SOLN
0.5000 mg | INTRAMUSCULAR | Status: DC | PRN
  Administered 2020-04-20: 0.5 mg via INTRAVENOUS

## 2020-04-20 MED ORDER — LIDOCAINE 2% (20 MG/ML) 5 ML SYRINGE
INTRAMUSCULAR | Status: AC
  Filled 2020-04-20: qty 5

## 2020-04-20 MED ORDER — PROMETHAZINE HCL 25 MG/ML IJ SOLN: 6.2500 mg | INTRAMUSCULAR | Status: DC | PRN

## 2020-04-20 MED ORDER — ACETAMINOPHEN 500 MG PO TABS
1000.0000 mg | ORAL_TABLET | ORAL | Status: DC
  Filled 2020-04-20: qty 2

## 2020-04-20 MED ORDER — 0.9 % SODIUM CHLORIDE (POUR BTL) OPTIME
TOPICAL | Status: DC | PRN
  Administered 2020-04-20: 2000 mL

## 2020-04-20 MED ORDER — PHENYLEPHRINE 40 MCG/ML (10ML) SYRINGE FOR IV PUSH (FOR BLOOD PRESSURE SUPPORT)
PREFILLED_SYRINGE | INTRAVENOUS | Status: AC
  Filled 2020-04-20: qty 10

## 2020-04-20 MED ORDER — ACETAMINOPHEN 500 MG PO TABS
1000.0000 mg | ORAL_TABLET | Freq: Four times a day (QID) | ORAL | Status: DC
  Administered 2020-04-20 – 2020-04-23 (×8): 1000 mg via ORAL
  Filled 2020-04-20 (×11): qty 2

## 2020-04-20 SURGICAL SUPPLY — 66 items
ADH SKN CLS APL DERMABOND .7 (GAUZE/BANDAGES/DRESSINGS) ×1
AGENT HMST KT MTR STRL THRMB (HEMOSTASIS) ×1
APL PRP STRL LF DISP 70% ISPRP (MISCELLANEOUS) ×1
ATTRACTOMAT 16X20 MAGNETIC DRP (DRAPES) IMPLANT
BACTOSHIELD CHG 4% 4OZ (MISCELLANEOUS) ×1
BLADE EXTENDED COATED 6.5IN (ELECTRODE) ×2 IMPLANT
CELLS DAT CNTRL 66122 CELL SVR (MISCELLANEOUS) IMPLANT
CHLORAPREP W/TINT 26 (MISCELLANEOUS) ×2 IMPLANT
CLIP VESOCCLUDE LG 6/CT (CLIP) ×2 IMPLANT
CLIP VESOCCLUDE MED 6/CT (CLIP) ×2 IMPLANT
CLIP VESOCCLUDE MED LG 6/CT (CLIP) ×2 IMPLANT
CNTNR URN SCR LID CUP LEK RST (MISCELLANEOUS) IMPLANT
CONT SPEC 4OZ STRL OR WHT (MISCELLANEOUS) ×2
COVER WAND RF STERILE (DRAPES) IMPLANT
DERMABOND ADVANCED (GAUZE/BANDAGES/DRESSINGS) ×1
DERMABOND ADVANCED .7 DNX12 (GAUZE/BANDAGES/DRESSINGS) IMPLANT
DRAPE SURG IRRIG POUCH 19X23 (DRAPES) ×2 IMPLANT
DRAPE WARM FLUID 44X44 (DRAPES) ×2 IMPLANT
DRSG OPSITE POSTOP 4X10 (GAUZE/BANDAGES/DRESSINGS) ×1 IMPLANT
DRSG OPSITE POSTOP 4X6 (GAUZE/BANDAGES/DRESSINGS) IMPLANT
DRSG OPSITE POSTOP 4X8 (GAUZE/BANDAGES/DRESSINGS) IMPLANT
ELECT REM PT RETURN 15FT ADLT (MISCELLANEOUS) ×2 IMPLANT
GAUZE 4X4 16PLY RFD (DISPOSABLE) IMPLANT
GLOVE BIO SURGEON STRL SZ 6 (GLOVE) ×4 IMPLANT
GLOVE BIO SURGEON STRL SZ 6.5 (GLOVE) ×4 IMPLANT
GOWN STRL REUS W/ TWL LRG LVL3 (GOWN DISPOSABLE) ×2 IMPLANT
GOWN STRL REUS W/TWL LRG LVL3 (GOWN DISPOSABLE) ×4
HANDLE SUCTION POOLE (INSTRUMENTS) IMPLANT
HEMOSTAT ARISTA ABSORB 3G PWDR (HEMOSTASIS) ×1 IMPLANT
KIT BASIN (CUSTOM PROCEDURE TRAY) ×2 IMPLANT
KIT TURNOVER KIT A (KITS) IMPLANT
LOOP VESSEL MAXI BLUE (MISCELLANEOUS) IMPLANT
NEEDLE HYPO 22GX1.5 SAFETY (NEEDLE) ×4 IMPLANT
NS IRRIG 1000ML POUR BTL (IV SOLUTION) ×4 IMPLANT
PACK GENERAL/GYN (CUSTOM PROCEDURE TRAY) ×2 IMPLANT
PENCIL SMOKE EVACUATOR (MISCELLANEOUS) IMPLANT
RETRACTOR WND ALEXIS 18 MED (MISCELLANEOUS) IMPLANT
RETRACTOR WND ALEXIS 25 LRG (MISCELLANEOUS) IMPLANT
RTRCTR WOUND ALEXIS 18CM MED (MISCELLANEOUS)
RTRCTR WOUND ALEXIS 25CM LRG (MISCELLANEOUS) ×2
SCRUB CHG 4% DYNA-HEX 4OZ (MISCELLANEOUS) ×1 IMPLANT
SHEET LAVH (DRAPES) ×2 IMPLANT
SLEEVE SUCTION CATH 165 (SLEEVE) ×2 IMPLANT
SPONGE LAP 18X18 RF (DISPOSABLE) ×2 IMPLANT
SUCTION POOLE HANDLE (INSTRUMENTS) ×2
SURGIFLO W/THROMBIN 8M KIT (HEMOSTASIS) ×1 IMPLANT
SUT MNCRL AB 4-0 PS2 18 (SUTURE) ×4 IMPLANT
SUT PDS AB 1 TP1 96 (SUTURE) ×4 IMPLANT
SUT VIC AB 0 CT1 27 (SUTURE) ×10
SUT VIC AB 0 CT1 27XBRD ANTBC (SUTURE) IMPLANT
SUT VIC AB 2-0 CT1 27 (SUTURE) ×4
SUT VIC AB 2-0 CT1 36 (SUTURE) ×11 IMPLANT
SUT VIC AB 2-0 CT1 TAPERPNT 27 (SUTURE) ×2 IMPLANT
SUT VIC AB 2-0 CT2 27 (SUTURE) ×12 IMPLANT
SUT VIC AB 2-0 SH 27 (SUTURE)
SUT VIC AB 2-0 SH 27X BRD (SUTURE) IMPLANT
SUT VIC AB 3-0 CTX 36 (SUTURE) IMPLANT
SUT VIC AB 3-0 SH 18 (SUTURE) IMPLANT
SUT VIC AB 3-0 SH 27 (SUTURE) ×2
SUT VIC AB 3-0 SH 27X BRD (SUTURE) ×1 IMPLANT
SUT VIC AB 4-0 PS2 27 (SUTURE) ×2 IMPLANT
SYR 30ML LL (SYRINGE) ×4 IMPLANT
TOWEL OR 17X26 10 PK STRL BLUE (TOWEL DISPOSABLE) ×2 IMPLANT
TOWEL OR NON WOVEN STRL DISP B (DISPOSABLE) ×2 IMPLANT
TRAY FOLEY MTR SLVR 16FR STAT (SET/KITS/TRAYS/PACK) ×2 IMPLANT
UNDERPAD 30X36 HEAVY ABSORB (UNDERPADS AND DIAPERS) ×2 IMPLANT

## 2020-04-20 NOTE — Brief Op Note (Signed)
04/20/2020  10:01 AM  PATIENT:  Paula Evans  34 y.o. female  PRE-OPERATIVE DIAGNOSIS:  PELVIC MASS  POST-OPERATIVE DIAGNOSIS:  PELVIC MASS, uterine mass - on frozen c/w benign or low-grade tumor (thought to be spindle cell or fibrothecoma)  PROCEDURE:  Procedure(s): EXPLORATORY LAPAROTOMY, EXCISION OF PELVIC MASS, MYOMECTOMY (N/A)  SURGEON:  Surgeon(s) and Role:    Lafonda Mosses, MD - Primary    * Lahoma Crocker, MD - Assisting  ANESTHESIA:   general  EBL:  1000 mL   BLOOD ADMINISTERED:none  DRAINS: none   LOCAL MEDICATIONS USED:  MARCAINE     SPECIMEN:  Pelvic mass  DISPOSITION OF SPECIMEN:  PATHOLOGY  COUNTS:  YES  TOURNIQUET:  * No tourniquets in log *  DICTATION: .Note written in EPIC  PLAN OF CARE: Admit to inpatient   PATIENT DISPOSITION:  PACU - hemodynamically stable.   Delay start of Pharmacological VTE agent (>24hrs) due to surgical blood loss or risk of bleeding: yes

## 2020-04-20 NOTE — Anesthesia Procedure Notes (Signed)
Procedure Name: Intubation Date/Time: 04/20/2020 7:34 AM Performed by: Claudia Desanctis, CRNA Pre-anesthesia Checklist: Patient identified, Emergency Drugs available, Suction available and Patient being monitored Patient Re-evaluated:Patient Re-evaluated prior to induction Oxygen Delivery Method: Circle system utilized Preoxygenation: Pre-oxygenation with 100% oxygen Induction Type: IV induction Ventilation: Mask ventilation without difficulty Laryngoscope Size: 2 and Miller Grade View: Grade I Tube type: Oral Tube size: 7.0 mm Number of attempts: 1 Airway Equipment and Method: Stylet Placement Confirmation: ETT inserted through vocal cords under direct vision,  positive ETCO2 and breath sounds checked- equal and bilateral Secured at: 21 cm Tube secured with: Tape Dental Injury: Teeth and Oropharynx as per pre-operative assessment

## 2020-04-20 NOTE — Anesthesia Postprocedure Evaluation (Signed)
Anesthesia Post Note  Patient: Paula Evans  Procedure(s) Performed: EXPLORATORY LAPAROTOMY, EXCISION OF PELVIC MASS, MYOMECTOMY (N/A )     Patient location during evaluation: PACU Anesthesia Type: General Level of consciousness: awake and alert Pain management: pain level controlled Vital Signs Assessment: post-procedure vital signs reviewed and stable Respiratory status: spontaneous breathing, nonlabored ventilation and respiratory function stable Cardiovascular status: blood pressure returned to baseline and stable Postop Assessment: no apparent nausea or vomiting Anesthetic complications: no   No complications documented.  Last Vitals:  Vitals:   04/20/20 1115 04/20/20 1139  BP: (!) 105/56 117/66  Pulse: 77 83  Resp: 17 16  Temp:  36.7 C  SpO2: 100% 100%    Last Pain:  Vitals:   04/20/20 1139  TempSrc: Oral  PainSc:                  Audry Pili

## 2020-04-20 NOTE — Interval H&P Note (Signed)
History and Physical Interval Note:  04/20/2020 7:01 AM  Paula Evans  has presented today for surgery, with the diagnosis of PELVIC MASS.  The various methods of treatment have been discussed with the patient and family. After consideration of risks, benefits and other options for treatment, the patient has consented to  Procedure(s): OPEN UNILATERAL SALPINGO-OOPHORECTOMY, POSSIBLE STAGING (N/A) as a surgical intervention.  The patient's history has been reviewed, patient examined, no change in status, stable for surgery.  I have reviewed the patient's chart and labs.  Questions were answered to the patient's satisfaction.     Lafonda Mosses

## 2020-04-20 NOTE — Transfer of Care (Signed)
Immediate Anesthesia Transfer of Care Note  Patient: Paula Evans  Procedure(s) Performed: EXPLORATORY LAPAROTOMY, EXCISION OF PELVIC MASS, MYOMECTOMY (N/A )  Patient Location: PACU  Anesthesia Type:General  Level of Consciousness: awake, alert , oriented and patient cooperative  Airway & Oxygen Therapy: Patient Spontanous Breathing and Patient connected to face mask  Post-op Assessment: Report given to RN and Post -op Vital signs reviewed and stable  Post vital signs: Reviewed and stable  Last Vitals:  Vitals Value Taken Time  BP 119/51 04/20/20 1006  Temp    Pulse 109 04/20/20 1009  Resp 16 04/20/20 1009  SpO2 100 % 04/20/20 1009  Vitals shown include unvalidated device data.  Last Pain:  Vitals:   04/20/20 0628  TempSrc:   PainSc: 2       Patients Stated Pain Goal: 3 (29/29/09 0301)  Complications: No complications documented.

## 2020-04-20 NOTE — Op Note (Signed)
PATIENT: Paula Evans DATE OF BIRTH: 1986-09-18 ENCOUNTER DATE: 04/20/20   Preop Diagnosis: Pelvic mass, ascites, mildly elevated CA-125  Postoperative Diagnosis: fundal uterine mass (on frozen section suspected benign or low grade tumor, spindle cell vs fibrothecoma)  Surgery: Exlap, excision of pelvic mass  Surgeons:  Jeral Pinch, MD; Lahoma Crocker, MD   Anesthesia: General   Estimated blood loss: 1000 ml 9L of ascites drained  IVF:  See I&O flowsheet, albumin and crystalloid   Urine output: 355 ml   Complications: None   Pathology: Uterus, cervix, bilateral tubes and ovaries  Operative findings: On exam under anesthesia, large firm mass filling the cul-de-sac, minimally mobile, extensive ascites noted.  On intra-abdominal entry, approximately 9 L of straw-colored ascites drained.  Normal upper abdominal survey including smooth diaphragm and liver edge, stomach, and omentum.  Normal-appearing small and large bowel, appendix.  No adenopathy.  Approximately 12-14 cm solid mass noted emanating from the right lateral aspect of the fundus and round ligament.  The mass itself with some serpentine appearing vasculature from the uterus as well as from the deep right sidewall.  The mass itself was attached to the peritoneum along the right pelvic sidewall and cul-de-sac as well as over the sacrum.  Significant bleeding encountered during dissection of the mass from surrounding peritoneal tissue.  Uterus otherwise normal appearing and approximately 6 cm in size with a 2-3 intramural posterior fibroid.  Bilateral adnexa normal appearing.  Left ovary with a 2-3 cm simple appearing cyst.  2-3 cm area of plaque-like inflammation along the right aspect of the posterior cul-de-sac.  No intra-abdominal or pelvic evidence of metastatic disease.  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  Procedure: The patient was identified in the preoperative holding area. Informed consent was signed on the chart. Patient was seen history was reviewed and exam was performed.   The patient was then taken to the operating room and placed in the supine position with SCD hose on. General anesthesia was then induced without difficulty. She was then placed in the dorsolithotomy position. The abdomen was prepped with chlor prep sponges per protocol. Perineum was prepped with Betadine. The vagina was prepped with Betadine a Foley catheter was inserted into the bladder under sterile conditions.  The patient was then draped after the prep was dried. Timeout was performed the patient, procedure, antibiotic, allergy, and length of procedure. A vertical midline infraumbilical incision was and carried down to the underlying fascia using Bovie cautery. The fascia was scored in the fascial incision was extended superiorly and inferiorly using Bovie cautery. The rectus bellies were dissected off the overlying fascia. The peritoneum was tented and entered. Extensive straw-colored ascites was drained (some sent for cytology).  The peritoneal incision was extended superiorly and inferiorly with visualization of the underlying peritoneal cavity.  The upper abdomen was explored with findings as noted above.  A wound protector was placed. The Book[place]ter self-retaining retractor was then placed. At the initial placement as well as at several points during the case the lateral blades were checked to ensure no significant pressure on the psoas bellies.  Attention was first turned to the pelvic mass.  The vascular attachment of the mass to the superior lateral aspect of the uterus and round ligament on the right was isolated, clamped twice, and cut, freeing the mass from the uterus.  This pedicle was then double suture-ligated.  Attention was then  turned to freeing the mass from the deep right pelvic sidewall and  sacrum.  Several peritoneal attachments that appeared vascular were isolated, clamped, cut and suture-ligated until ultimately the mass was freed completely.  During this dissection, there was significant blood loss with approximately 600 cc lost in a period of 10 minutes.  Once freed, the mass was handed off the field to be sent for frozen pathology.  The pelvis was then packed to help achieve hemostasis.  The small and large bowel were packed out of the way of the surgical field with moist laparotomy sponges and malleable retractors were attached to the [place].   The abdomen pelvis were copiously irrigated.  2-0 Vicryl was used to suture ligate several peritoneal surfaces on the right sidewall after the retroperitoneum have been opened and the right ureter identified.  Given raw peritoneal surfaces along the lateral side wall deep in the pelvis, surgiflow and arista used and 3 minutes of pressure held with good hemostasis noted.  At this point frozen section returned as noted above. Given normal appearing pelvic and intra-abdominal organs, the procedure was terminated. The pedicles were noted to be hemostatic. The retractor and laparotomy sponges were removed. The fascia was closed using running mass closure of #1 PDS. The subcutaneous tissues were irrigated and made hemostatic. 40 mL of 0.25% Marcaine was injected for postoperative pain control. The subcutaneous tissue was reapproximated with 2-0 and 4-0 Vicryl. Skin closed with 4-0 Monocryl in subcuticular fashion. Dermabond was placed on the incision followed by a honeycomb dressing.  All instrument, suture, laparotomy, Ray-Tec, and needle counts were correct x2. Foley catheter was removed. The patient tolerated the procedure well and was taken recovery room in stable condition.   Jeral Pinch MD Gynecologic Oncology

## 2020-04-21 ENCOUNTER — Encounter (HOSPITAL_COMMUNITY): Payer: Self-pay | Admitting: Gynecologic Oncology

## 2020-04-21 DIAGNOSIS — D261 Other benign neoplasm of corpus uteri: Secondary | ICD-10-CM | POA: Diagnosis not present

## 2020-04-21 DIAGNOSIS — Z8041 Family history of malignant neoplasm of ovary: Secondary | ICD-10-CM | POA: Diagnosis not present

## 2020-04-21 DIAGNOSIS — R970 Elevated carcinoembryonic antigen [CEA]: Secondary | ICD-10-CM | POA: Diagnosis not present

## 2020-04-21 DIAGNOSIS — D271 Benign neoplasm of left ovary: Secondary | ICD-10-CM | POA: Diagnosis not present

## 2020-04-21 DIAGNOSIS — D251 Intramural leiomyoma of uterus: Secondary | ICD-10-CM | POA: Diagnosis not present

## 2020-04-21 DIAGNOSIS — Z20822 Contact with and (suspected) exposure to covid-19: Secondary | ICD-10-CM | POA: Diagnosis not present

## 2020-04-21 DIAGNOSIS — Z801 Family history of malignant neoplasm of trachea, bronchus and lung: Secondary | ICD-10-CM | POA: Diagnosis not present

## 2020-04-21 DIAGNOSIS — Z87410 Personal history of cervical dysplasia: Secondary | ICD-10-CM | POA: Diagnosis not present

## 2020-04-21 DIAGNOSIS — Z87891 Personal history of nicotine dependence: Secondary | ICD-10-CM | POA: Diagnosis not present

## 2020-04-21 DIAGNOSIS — R188 Other ascites: Secondary | ICD-10-CM | POA: Diagnosis not present

## 2020-04-21 DIAGNOSIS — Z803 Family history of malignant neoplasm of breast: Secondary | ICD-10-CM | POA: Diagnosis not present

## 2020-04-21 DIAGNOSIS — Z79899 Other long term (current) drug therapy: Secondary | ICD-10-CM | POA: Diagnosis not present

## 2020-04-21 DIAGNOSIS — Z791 Long term (current) use of non-steroidal anti-inflammatories (NSAID): Secondary | ICD-10-CM | POA: Diagnosis not present

## 2020-04-21 LAB — BASIC METABOLIC PANEL
Anion gap: 4 — ABNORMAL LOW (ref 5–15)
BUN: 6 mg/dL (ref 6–20)
CO2: 26 mmol/L (ref 22–32)
Calcium: 7.7 mg/dL — ABNORMAL LOW (ref 8.9–10.3)
Chloride: 107 mmol/L (ref 98–111)
Creatinine, Ser: 0.72 mg/dL (ref 0.44–1.00)
GFR calc Af Amer: >60 mL/min (ref >60)
GFR calc non Af Amer: >60 mL/min (ref >60)
Glucose, Bld: 121 mg/dL — ABNORMAL HIGH (ref 70–99)
Potassium: 3.6 mmol/L (ref 3.5–5.1)
Sodium: 137 mmol/L (ref 135–145)

## 2020-04-21 LAB — CBC
HCT: 21.7 % — ABNORMAL LOW (ref 36.0–46.0)
HCT: 22.1 % — ABNORMAL LOW (ref 36.0–46.0)
Hemoglobin: 7.2 g/dL — ABNORMAL LOW (ref 12.0–15.0)
Hemoglobin: 7.3 g/dL — ABNORMAL LOW (ref 12.0–15.0)
MCH: 28.1 pg (ref 26.0–34.0)
MCH: 28.3 pg (ref 26.0–34.0)
MCHC: 33.2 g/dL (ref 30.0–36.0)
MCHC: 33 g/dL (ref 30.0–36.0)
MCV: 84.8 fL (ref 80.0–100.0)
MCV: 85.7 fL (ref 80.0–100.0)
Platelets: 287 10*3/uL (ref 150–400)
Platelets: 285 10*3/uL (ref 150–400)
RBC: 2.56 MIL/uL — ABNORMAL LOW (ref 3.87–5.11)
RBC: 2.58 MIL/uL — ABNORMAL LOW (ref 3.87–5.11)
RDW: 13.2 % (ref 11.5–15.5)
RDW: 13.2 % (ref 11.5–15.5)
WBC: 9.4 10*3/uL (ref 4.0–10.5)
WBC: 9.7 10*3/uL (ref 4.0–10.5)
nRBC: 0 % (ref 0.0–0.2)
nRBC: 0 % (ref 0.0–0.2)

## 2020-04-21 LAB — PREPARE RBC (CROSSMATCH)

## 2020-04-21 MED ORDER — SODIUM CHLORIDE 0.9% IV SOLUTION: Freq: Once | INTRAVENOUS | Status: AC

## 2020-04-21 MED ORDER — PHENAZOPYRIDINE HCL 100 MG PO TABS
100.0000 mg | ORAL_TABLET | Freq: Three times a day (TID) | ORAL | Status: DC
  Administered 2020-04-21 – 2020-04-23 (×4): 100 mg via ORAL
  Filled 2020-04-21 (×5): qty 1

## 2020-04-21 NOTE — Progress Notes (Signed)
1 Day Post-Op Procedure(s) (LRB): EXPLORATORY LAPAROTOMY, EXCISION OF PELVIC MASS, MYOMECTOMY (N/A)  Subjective: Patient reports doing well although woken up frequently last night. Tolerated liquids and salad last night, denies nausea. Voiding with some pain (spasms). Denies flatus. Denies dizziness with ambulation.  Objective: Vital signs in last 24 hours: Temp:  [98 F (36.7 C)-99.3 F (37.4 C)] 99.3 F (37.4 C) (06/25 0617) Pulse Rate:  [65-111] 73 (06/25 0617) Resp:  [12-20] 20 (06/25 0617) BP: (99-122)/(51-66) 104/56 (06/25 0617) SpO2:  [93 %-100 %] 100 % (06/25 0617)    Intake/Output from previous day: 06/24 0701 - 06/25 0700 In: 4388.1 [P.O.:120; I.V.:3668.1; IV Piggyback:600] Out: 10250 [Urine:1750; Blood:1000]  Physical Examination: General: Alert, oriented, no acute distress. HEENT: Normocephalic, atraumatic, sclera anicteric. Chest: Clear to auscultation bilaterally.  No wheezes or rhonchi. Cardiovascular: Regular rate and rhythm, no murmurs. Abdomen: soft, nontender, non distended.  Normoactive bowel sounds.  No masses or hepatosplenomegaly appreciated.  Incision c/d/i with honeycomb dressing in place. Extremities: Grossly normal range of motion.  Warm, well perfused.  No edema bilaterally.  Labs: CBC    Component Value Date/Time   WBC 9.4 04/21/2020 0453   RBC 2.56 (L) 04/21/2020 0453   HGB 7.2 (L) 04/21/2020 0453   HGB 12.5 09/12/2015 1626   HCT 21.7 (L) 04/21/2020 0453   HCT 38.8 09/12/2015 1626   PLT 287 04/21/2020 0453   PLT 332 09/12/2015 1626   MCV 84.8 04/21/2020 0453   MCV 78 (L) 09/12/2015 1626   MCV 84 07/03/2014 2008   MCH 28.1 04/21/2020 0453   MCHC 33.2 04/21/2020 0453   RDW 13.2 04/21/2020 0453   RDW 15.5 (H) 09/12/2015 1626   RDW 14.1 07/03/2014 2008   LYMPHSABS 1.6 01/27/2017 1128   MONOABS 0.7 01/27/2017 1128   EOSABS 0.1 01/27/2017 1128   BASOSABS 0.0 01/27/2017 1128   CMP Latest Ref Rng & Units 04/21/2020 04/17/2020 04/14/2020   Glucose 70 - 99 mg/dL 121(H) 92 95  BUN 6 - 20 mg/dL 6 10 14   Creatinine 0.44 - 1.00 mg/dL 0.72 0.88 0.95  Sodium 135 - 145 mmol/L 137 137 134(L)  Potassium 3.5 - 5.1 mmol/L 3.6 4.5 3.6  Chloride 98 - 111 mmol/L 107 101 102  CO2 22 - 32 mmol/L 26 25 23   Calcium 8.9 - 10.3 mg/dL 7.7(L) 8.6(L) 8.5(L)  Total Protein 6.5 - 8.1 g/dL - - 6.5  Total Bilirubin 0.3 - 1.2 mg/dL - - 0.9  Alkaline Phos 38 - 126 U/L - - 48  AST 15 - 41 U/L - - 20  ALT 0 - 44 U/L - - 13   Assessment:  34 y.o. s/p Procedure(s): EXPLORATORY LAPAROTOMY, EXCISION OF PELVIC MASS, MYOMECTOMY: stable Pain:  Pain well controlled. Will start Azo for bladder spasms.   Heme: Acute anemia secondary to blood loss. Recheck H&H this morning. Asymptomatic, if no further trend down, will hold off on transfusion.  GI/FEN:  Tolerating regular diet, will continue to advance as tolerated. DC IVFs.  Prophylaxis: holding lovenox until repeat H&H.  Plan: Advance diet Encourage ambulation Discontinue IV fluids The patient is to be discharged to home, likely tomorrow.   LOS: 1 day    Paula Evans 04/21/2020, 7:55 AM

## 2020-04-22 ENCOUNTER — Encounter (HOSPITAL_COMMUNITY): Payer: Self-pay | Admitting: *Deleted

## 2020-04-22 DIAGNOSIS — Z87410 Personal history of cervical dysplasia: Secondary | ICD-10-CM | POA: Diagnosis not present

## 2020-04-22 DIAGNOSIS — Z791 Long term (current) use of non-steroidal anti-inflammatories (NSAID): Secondary | ICD-10-CM | POA: Diagnosis not present

## 2020-04-22 DIAGNOSIS — R188 Other ascites: Secondary | ICD-10-CM | POA: Diagnosis not present

## 2020-04-22 DIAGNOSIS — D271 Benign neoplasm of left ovary: Secondary | ICD-10-CM | POA: Diagnosis not present

## 2020-04-22 DIAGNOSIS — Z20822 Contact with and (suspected) exposure to covid-19: Secondary | ICD-10-CM | POA: Diagnosis not present

## 2020-04-22 DIAGNOSIS — D251 Intramural leiomyoma of uterus: Secondary | ICD-10-CM | POA: Diagnosis not present

## 2020-04-22 DIAGNOSIS — Z87891 Personal history of nicotine dependence: Secondary | ICD-10-CM | POA: Diagnosis not present

## 2020-04-22 DIAGNOSIS — D261 Other benign neoplasm of corpus uteri: Secondary | ICD-10-CM | POA: Diagnosis not present

## 2020-04-22 DIAGNOSIS — R970 Elevated carcinoembryonic antigen [CEA]: Secondary | ICD-10-CM | POA: Diagnosis not present

## 2020-04-22 DIAGNOSIS — Z803 Family history of malignant neoplasm of breast: Secondary | ICD-10-CM | POA: Diagnosis not present

## 2020-04-22 DIAGNOSIS — Z8041 Family history of malignant neoplasm of ovary: Secondary | ICD-10-CM | POA: Diagnosis not present

## 2020-04-22 DIAGNOSIS — Z79899 Other long term (current) drug therapy: Secondary | ICD-10-CM | POA: Diagnosis not present

## 2020-04-22 DIAGNOSIS — Z801 Family history of malignant neoplasm of trachea, bronchus and lung: Secondary | ICD-10-CM | POA: Diagnosis not present

## 2020-04-22 LAB — BASIC METABOLIC PANEL
Anion gap: 8 (ref 5–15)
BUN: 5 mg/dL — ABNORMAL LOW (ref 6–20)
CO2: 25 mmol/L (ref 22–32)
Calcium: 8.2 mg/dL — ABNORMAL LOW (ref 8.9–10.3)
Chloride: 108 mmol/L (ref 98–111)
Creatinine, Ser: 0.7 mg/dL (ref 0.44–1.00)
GFR calc Af Amer: >60 mL/min (ref >60)
GFR calc non Af Amer: >60 mL/min (ref >60)
Glucose, Bld: 99 mg/dL (ref 70–99)
Potassium: 3.9 mmol/L (ref 3.5–5.1)
Sodium: 141 mmol/L (ref 135–145)

## 2020-04-22 LAB — CBC
HCT: 27.8 % — ABNORMAL LOW (ref 36.0–46.0)
Hemoglobin: 9.1 g/dL — ABNORMAL LOW (ref 12.0–15.0)
MCH: 28.5 pg (ref 26.0–34.0)
MCHC: 32.7 g/dL (ref 30.0–36.0)
MCV: 87.1 fL (ref 80.0–100.0)
Platelets: 280 10*3/uL (ref 150–400)
RBC: 3.19 MIL/uL — ABNORMAL LOW (ref 3.87–5.11)
RDW: 13.8 % (ref 11.5–15.5)
WBC: 8.2 10*3/uL (ref 4.0–10.5)
nRBC: 0 % (ref 0.0–0.2)

## 2020-04-22 MED ORDER — DEXTROSE-NACL 5-0.45 % IV SOLN: INTRAVENOUS | Status: DC

## 2020-04-22 MED ORDER — ENOXAPARIN SODIUM 40 MG/0.4ML ~~LOC~~ SOLN
40.0000 mg | SUBCUTANEOUS | Status: DC
  Administered 2020-04-22 – 2020-04-23 (×2): 40 mg via SUBCUTANEOUS
  Filled 2020-04-22 (×2): qty 0.4

## 2020-04-22 MED ORDER — KETOROLAC TROMETHAMINE 15 MG/ML IJ SOLN: 15.0000 mg | Freq: Four times a day (QID) | INTRAMUSCULAR | Status: DC | PRN

## 2020-04-22 MED ORDER — KETOROLAC TROMETHAMINE 15 MG/ML IJ SOLN
15.0000 mg | Freq: Four times a day (QID) | INTRAMUSCULAR | Status: AC
  Administered 2020-04-22 – 2020-04-23 (×4): 15 mg via INTRAVENOUS
  Filled 2020-04-22 (×4): qty 1

## 2020-04-22 MED ORDER — PANTOPRAZOLE SODIUM 40 MG IV SOLR
40.0000 mg | INTRAVENOUS | Status: DC
  Administered 2020-04-22: 40 mg via INTRAVENOUS
  Filled 2020-04-22: qty 40

## 2020-04-22 MED ORDER — BISACODYL 10 MG RE SUPP
10.0000 mg | Freq: Every day | RECTAL | Status: DC
  Administered 2020-04-22: 10 mg via RECTAL
  Filled 2020-04-22: qty 1

## 2020-04-22 NOTE — Progress Notes (Signed)
2 Days Post-Op Procedure(s) (LRB): EXPLORATORY LAPAROTOMY, EXCISION OF PELVIC MASS, MYOMECTOMY (N/A)  Events overnight: several episodes of emesis, cramping  Subjective: C/O craming. No flatus  Objective: Vital signs in last 24 hours: Temp:  [97.9 F (36.6 C)-98.8 F (37.1 C)] 97.9 F (36.6 C) (06/26 0531) Pulse Rate:  [63-91] 69 (06/26 0531) Resp:  [16-18] 16 (06/26 0531) BP: (96-128)/(49-82) 128/82 (06/26 0531) SpO2:  [99 %-100 %] 99 % (06/25 2113) Last BM Date: 04/20/20  Intake/Output from previous day: 06/25 0701 - 06/26 0700 In: 840 [P.O.:840] Out: 2101 [Urine:1901; Emesis/NG output:200]  Physical Examination: General: Alert, oriented, no acute distress. HEENT: Normocephalic, atraumatic, sclera anicteric. Chest: Clear to auscultation bilaterally.  No wheezes or rhonchi. Cardiovascular: Regular rate and rhythm, no murmurs. Abdomen: softly distended, hypoactive bowel sounds. Incision c/d/i with honeycomb dressing in place. Extremities: Grossly normal range of motion.  Warm, well perfused.  No edema bilaterally.  Labs: CBC    Component Value Date/Time   WBC 8.2 04/22/2020 0514   RBC 3.19 (L) 04/22/2020 0514   HGB 9.1 (L) 04/22/2020 0514   HGB 12.5 09/12/2015 1626   HCT 27.8 (L) 04/22/2020 0514   HCT 38.8 09/12/2015 1626   PLT 280 04/22/2020 0514   PLT 332 09/12/2015 1626   MCV 87.1 04/22/2020 0514   MCV 78 (L) 09/12/2015 1626   MCV 84 07/03/2014 2008   MCH 28.5 04/22/2020 0514   MCHC 32.7 04/22/2020 0514   RDW 13.8 04/22/2020 0514   RDW 15.5 (H) 09/12/2015 1626   RDW 14.1 07/03/2014 2008   LYMPHSABS 1.6 01/27/2017 1128   MONOABS 0.7 01/27/2017 1128   EOSABS 0.1 01/27/2017 1128   BASOSABS 0.0 01/27/2017 1128   CMP Latest Ref Rng & Units 04/22/2020 04/21/2020 04/17/2020  Glucose 70 - 99 mg/dL 99 121(H) 92  BUN 6 - 20 mg/dL 5(L) 6 10  Creatinine 0.44 - 1.00 mg/dL 0.70 0.72 0.88  Sodium 135 - 145 mmol/L 141 137 137  Potassium 3.5 - 5.1 mmol/L 3.9 3.6 4.5   Chloride 98 - 111 mmol/L 108 107 101  CO2 22 - 32 mmol/L 25 26 25   Calcium 8.9 - 10.3 mg/dL 8.2(L) 7.7(L) 8.6(L)  Total Protein 6.5 - 8.1 g/dL - - -  Total Bilirubin 0.3 - 1.2 mg/dL - - -  Alkaline Phos 38 - 126 U/L - - -  AST 15 - 41 U/L - - -  ALT 0 - 44 U/L - - -   Assessment:  34 y.o. s/p Procedure(s): EXPLORATORY LAPAROTOMY, EXCISION OF PELVIC MASS, MYOMECTOMY: stable  Pain:  Controlled w/IV narcotic analgesic  Heme: H & H stagle  GI/FEN:  Postoperative ileus, electrolytes in range  Prophylaxis: holding lovenox until repeat H&H.  Plan: Sips of clears Resume IV Toradol to minimize narcotic use Encourage ambulation Resume  IV fluids Serial labs   LOS: 2 days    Lahoma Crocker 04/22/2020, 9:30 AM

## 2020-04-23 DIAGNOSIS — Z801 Family history of malignant neoplasm of trachea, bronchus and lung: Secondary | ICD-10-CM | POA: Diagnosis not present

## 2020-04-23 DIAGNOSIS — Z79899 Other long term (current) drug therapy: Secondary | ICD-10-CM | POA: Diagnosis not present

## 2020-04-23 DIAGNOSIS — Z87891 Personal history of nicotine dependence: Secondary | ICD-10-CM | POA: Diagnosis not present

## 2020-04-23 DIAGNOSIS — D251 Intramural leiomyoma of uterus: Secondary | ICD-10-CM | POA: Diagnosis not present

## 2020-04-23 DIAGNOSIS — Z20822 Contact with and (suspected) exposure to covid-19: Secondary | ICD-10-CM | POA: Diagnosis not present

## 2020-04-23 DIAGNOSIS — D261 Other benign neoplasm of corpus uteri: Secondary | ICD-10-CM | POA: Diagnosis not present

## 2020-04-23 DIAGNOSIS — D271 Benign neoplasm of left ovary: Secondary | ICD-10-CM | POA: Diagnosis not present

## 2020-04-23 DIAGNOSIS — Z8041 Family history of malignant neoplasm of ovary: Secondary | ICD-10-CM | POA: Diagnosis not present

## 2020-04-23 DIAGNOSIS — R970 Elevated carcinoembryonic antigen [CEA]: Secondary | ICD-10-CM | POA: Diagnosis not present

## 2020-04-23 DIAGNOSIS — Z87410 Personal history of cervical dysplasia: Secondary | ICD-10-CM | POA: Diagnosis not present

## 2020-04-23 DIAGNOSIS — Z803 Family history of malignant neoplasm of breast: Secondary | ICD-10-CM | POA: Diagnosis not present

## 2020-04-23 DIAGNOSIS — Z791 Long term (current) use of non-steroidal anti-inflammatories (NSAID): Secondary | ICD-10-CM | POA: Diagnosis not present

## 2020-04-23 DIAGNOSIS — R188 Other ascites: Secondary | ICD-10-CM | POA: Diagnosis not present

## 2020-04-23 LAB — CBC
HCT: 29 % — ABNORMAL LOW (ref 36.0–46.0)
Hemoglobin: 9.1 g/dL — ABNORMAL LOW (ref 12.0–15.0)
MCH: 27.7 pg (ref 26.0–34.0)
MCHC: 31.4 g/dL (ref 30.0–36.0)
MCV: 88.4 fL (ref 80.0–100.0)
Platelets: 366 10*3/uL (ref 150–400)
RBC: 3.28 MIL/uL — ABNORMAL LOW (ref 3.87–5.11)
RDW: 13.8 % (ref 11.5–15.5)
WBC: 8.8 10*3/uL (ref 4.0–10.5)
nRBC: 0 % (ref 0.0–0.2)

## 2020-04-23 LAB — COMPREHENSIVE METABOLIC PANEL
ALT: 15 U/L (ref 0–44)
AST: 24 U/L (ref 15–41)
Albumin: 2.6 g/dL — ABNORMAL LOW (ref 3.5–5.0)
Alkaline Phosphatase: 49 U/L (ref 38–126)
Anion gap: 7 (ref 5–15)
BUN: <5 mg/dL — ABNORMAL LOW (ref 6–20)
CO2: 27 mmol/L (ref 22–32)
Calcium: 8.1 mg/dL — ABNORMAL LOW (ref 8.9–10.3)
Chloride: 106 mmol/L (ref 98–111)
Creatinine, Ser: 0.72 mg/dL (ref 0.44–1.00)
GFR calc Af Amer: >60 mL/min (ref >60)
GFR calc non Af Amer: >60 mL/min (ref >60)
Glucose, Bld: 100 mg/dL — ABNORMAL HIGH (ref 70–99)
Potassium: 3.5 mmol/L (ref 3.5–5.1)
Sodium: 140 mmol/L (ref 135–145)
Total Bilirubin: 0.2 mg/dL — ABNORMAL LOW (ref 0.3–1.2)
Total Protein: 5.2 g/dL — ABNORMAL LOW (ref 6.5–8.1)

## 2020-04-23 LAB — TYPE AND SCREEN
ABO/RH(D): O POS
Antibody Screen: NEGATIVE
Unit division: 0

## 2020-04-23 LAB — BPAM RBC
Blood Product Expiration Date: 202107242359
ISSUE DATE / TIME: 202106251415
Unit Type and Rh: 5100

## 2020-04-23 LAB — MAGNESIUM: Magnesium: 1.8 mg/dL (ref 1.7–2.4)

## 2020-04-23 MED ORDER — IBUPROFEN 400 MG PO TABS
600.0000 mg | ORAL_TABLET | Freq: Four times a day (QID) | ORAL | Status: DC
  Administered 2020-04-23: 600 mg via ORAL
  Filled 2020-04-23: qty 1

## 2020-04-23 MED ORDER — PANTOPRAZOLE SODIUM 40 MG PO TBEC
40.0000 mg | DELAYED_RELEASE_TABLET | Freq: Every day | ORAL | Status: DC
  Administered 2020-04-23: 40 mg via ORAL
  Filled 2020-04-23: qty 1

## 2020-04-23 MED ORDER — OXYCODONE HCL 5 MG PO TABS: 5.0000 mg | ORAL_TABLET | ORAL | Status: DC | PRN

## 2020-04-23 NOTE — Discharge Summary (Signed)
Physician Discharge Summary  Patient ID: Paula Evans MRN: [id number] DOB/AGE: 34-Jul-1987 34 y.o.  Admit date: 04/20/2020 Discharge date: 04/23/2020  Admission Diagnoses: Pelvic mass Discharge Diagnoses:  Active Problems:   Pelvic mass  Anemia 2/2 acute blood loss   Discharged Condition: good  Hospital Course: On 04/20/2020, the patient underwent the following: Procedure(s): EXPLORATORY LAPAROTOMY, EXCISION OF PELVIC MASS, MYOMECTOMY.   The intraoperative course was complicated by large volume blood loss.  Hemoglobin nadired to 7.2 on postoperative day .  She was transfused 1 unit of packed red blood cells.  The posttransfusion hemoglobin trended up appropriately.  Her symptoms secondary to the anemia improved however she developed a postoperative ileus.  This resolved with conservative measures including bowel rest, pain control and IV fluid hydration.  She was discharged to home on postoperative day 3 tolerating a regular diet.  Consults: None  Significant Diagnostic Studies: None  Treatments: IV hydration and surgery: see above  Discharge Exam: Blood pressure 120/64, pulse 71, temperature 98.2 F (36.8 C), resp. rate 18, height 5\' 4"  (1.626 m), weight 101.2 kg, last menstrual period 04/11/2020, SpO2 100 %. General appearance: alert GI: soft, non-tender; bowel sounds normal; no masses,  no organomegaly and I: C/D/I Extremities: extremities normal, atraumatic, no cyanosis or edema and Homans sign is negative, no sign of DVT  Disposition: Discharge disposition: 01-Home or Self Care       Discharge Instructions     Remove dressing in 72 hours   Complete by: As directed    Activity as tolerated - No restrictions   Complete by: As directed    Call MD for:  extreme fatigue   Complete by: As directed    Call MD for:  persistant dizziness or light-headedness   Complete by: As directed    Call MD for:  persistant nausea and vomiting   Complete by: As directed    Call MD  for:  redness, tenderness, or signs of infection (pain, swelling, redness, odor or green/yellow discharge around incision site)   Complete by: As directed    Call MD for:  severe uncontrolled pain   Complete by: As directed    Call MD for:  temperature >100.4   Complete by: As directed    Diet - low sodium heart healthy   Complete by: As directed    Diet Carb Modified   Complete by: As directed    Diet general   Complete by: As directed    Driving Restrictions   Complete by: As directed    No driving for 1- 2 weeks   Increase activity slowly   Complete by: As directed    Lifting restrictions   Complete by: As directed    No lifting > 10 lbs for 6 weeks   May shower / Bathe   Complete by: As directed    No tub baths for 6 weeks   Sexual Activity Restrictions   Complete by: As directed    No intercourse for 4 weeks     Allergies as of 04/23/2020   No Known Allergies     Medication List    TAKE these medications   acetaminophen 500 MG tablet Commonly known as: TYLENOL Take 2 tablets (1,000 mg total) by mouth every 6 (six) hours as needed for moderate pain.   escitalopram 10 MG tablet Commonly known as: LEXAPRO Take 1 tablet (10 mg total) by mouth daily.   HYDROmorphone 2 MG tablet Commonly known as: DILAUDID Take 1 tablet (2  mg total) by mouth every 4 (four) hours as needed for severe pain. What changed: Another medication with the same name was removed. Continue taking this medication, and follow the directions you see here.   naproxen sodium 550 MG tablet Commonly known as: ANAPROX Take 1 tablet (550 mg total) by mouth 2 (two) times daily with a meal.   ondansetron 4 MG tablet Commonly known as: ZOFRAN Take 1 tablet (4 mg total) by mouth every 6 (six) hours as needed for nausea.   pantoprazole 40 MG tablet Commonly known as: PROTONIX Take 1 tablet (40 mg total) by mouth daily. Pharmacy-please d/c rx for 30 day script   simethicone 80 MG chewable  tablet Commonly known as: MYLICON Chew 1 tablet (80 mg total) by mouth 4 (four) times daily as needed for flatulence (gas).       Follow-up Information    Lafonda Mosses, MD In 3 weeks.   Specialty: Gynecologic Oncology Contact information: [place] [address] [phone]               Signed: Lahoma Crocker 04/23/2020, 10:25 AM

## 2020-04-23 NOTE — Discharge Instructions (Signed)
04/20/2020  Return to work: 4-6 weeks  Activity: 1. Be up and out of the bed during the day.  Take a nap if needed.  You may walk up steps but be careful and use the hand rail.  Stair climbing will tire you more than you think, you may need to stop part way and rest.   2. No lifting or straining for 6 weeks.  3. No driving for 1-2 weeks.  Do Not drive if you are taking narcotic pain medicine and feel that you can brake safely.  4. Shower daily.  Use soap and water on your incision and pat dry; don't rub.   5. No sexual activity and nothing in the vagina for 8 weeks.  Medications:  - Take ibuprofen and tylenol first line for pain control. Take these regularly (every 6 hours) to decrease the build up of pain.  - If necessary, for severe pain not relieved by ibuprofen, take oxycodone.  - While taking percocet you should take sennakot every night to reduce the likelihood of constipation. If this causes diarrhea, stop its use.  Diet: 1. Low sodium Heart Healthy Diet is recommended.  2. It is safe to use a laxative if you have difficulty moving your bowels.   Wound Care: 1. Keep clean and dry.  Shower daily.  Reasons to call the Doctor:   Fever - Oral temperature greater than 100.4 degrees Fahrenheit  Foul-smelling vaginal discharge  Difficulty urinating  Nausea and vomiting  Increased pain at the site of the incision that is unrelieved with pain medicine.  Difficulty breathing with or without chest pain  New calf pain especially if only on one side  Sudden, continuing increased vaginal bleeding with or without clots.   Follow-up: 1. See Jeral Pinch in 3 weeks.  Contacts: For questions or concerns you should contact:  Dr. Jeral Pinch at [phone] After hours and on week-ends call [phone] and ask to speak to the physician on call for Gynecologic Oncology   Myomectomy, Care After This sheet gives you information about how to care for yourself after  your procedure. Your health care provider may also give you more specific instructions. If you have problems or questions, contact your health care provider. What can I expect after the procedure? After the procedure, it is common to have:  Pain in your abdomen, especially at the incision areas. You will be given pain medicine to control the pain.  Tiredness. This is a normal part of the recovery process. Your energy level will return to normal over the coming weeks.  Vaginal bleeding. This is normal and will stop in the coming weeks.  Constipation. Recovery time from this procedure will depend on the type of procedure you had and your general overall health prior to the procedure. Follow these instructions at home: Medicines  Take over-the-counter and prescription medicines only as told by your health care provider.  Do not take aspirin because it can cause bleeding.  If you were prescribed an antibiotic medicine, use it as told by your health care provider. Do not stop using the antibiotic even if you start to feel better.  Do not drive or use heavy machinery while taking prescription pain medicine.  Do not drink alcohol while taking prescription pain medicine. Incision care   Follow instructions from your health care provider about how to take care of any incisions. Make sure you: ? Wash your hands with soap and water before you change your bandage (dressing). If  soap and water are not available, use hand sanitizer. ? Change your dressing as told by your health care provider. ? Leave stitches (sutures), skin glue, or adhesive strips in place. These skin closures may need to stay in place for 2 weeks or longer. If adhesive strip edges start to loosen and curl up, you may trim the loose edges. Do not remove adhesive strips completely unless your health care provider tells you to do that.  Check your incision areas every day for signs of infection. Check for: ? Redness, swelling, or  pain. ? Fluid or blood. ? Warmth. ? Pus or a bad smell.  Do not take baths, swim, or use a hot tub until your health care provider approves. Take showers as directed by your health care provider. Activity  Return to your normal activities as told by your health care provider. Ask your health care provider what activities are safe for you.  Do not do activities that require a lot of effort until your health care provider says it is okay.  Do not lift anything that is heavier than 15 lb (6.8 kg) until your health care provider says that it is safe.  Do not douche, use tampons, or have sexual intercourse until your health care provider approves.  Walk daily but take frequent rest breaks if you tire easily.  Continue to practice deep breathing and coughing. If it hurts to cough, try holding a pillow against your belly as you cough.  Do not drive until your health care provider approves. General instructions  To prevent or treat constipation while you are taking prescription pain medicine, your health care provider may recommend that you: ? Drink enough fluid to keep your urine clear or pale yellow. ? Take over-the-counter or prescription medicines. ? Eat foods that are high in fiber, such as fresh fruits and vegetables, whole grains, and beans. ? Limit foods that are high in fat and processed sugars, such as fried and sweet foods.  Take your temperature twice a day and write it down. If you develop a fever, this may be a sign that you have an infection.  Do not drink alcohol.  Have someone help you at home for 1 week or until you can do your own household activities.  Keep all follow-up visits as told by your health care provider. This is important. Contact a health care provider if:  You have a fever.  You have increasing abdominal pain that is not relieved with medicine.  You have nausea, vomiting, or diarrhea.  You have pain when you urinate or you have blood in your  urine.  You have a rash on your body.  You have pain or redness where your IV access tube was inserted.  You have redness, swelling, or pain around an incision.  You have fluid or blood coming from an incision.  An incision feels warm to the touch.  You have pus or a bad smell coming from an incision. Get help right away if:  You have weakness or light-headedness.  You have pain, swelling, or redness in your legs.  You have chest pain.  You faint.  You have shortness of breath.  You have heavy vaginal bleeding.  You have an incision that is opening up. Summary  Recovery time from this procedure will depend on the type of procedure you had and your general overall health prior to the procedure.  If you were prescribed an antibiotic medicine, use it as told by your health  care provider. Do not stop using the antibiotic even if you start to feel better.  Do not douche, use tampons, or have sexual intercourse until your health care provider approves.  Return to your normal activities as told by your health care provider. Ask your health care provider what activities are safe for you. This information is not intended to replace advice given to you by your health care provider. Make sure you discuss any questions you have with your health care provider. Document Revised: 09/26/2017 Document Reviewed: 11/14/2016 Elsevier Patient Education  2020 [employer].  Patient education: Anemia caused by low iron in adults (Beyond the Basics) Author: Kathrine Haddock, MD, FACP Section Editors: Cedric Fishman, MD Marina Gravel, Brooke Bonito, MD, MACP Deputy Editor: Dustin Flock, MD Contributor Disclosures All topics are updated as new evidence becomes available and our peer review process is complete. Literature review current through:May 2021.This topic last updated:Dec 15, 2019. IRON DEFICIENCY ANEMIA OVERVIEW--Anemia can be caused by a number of different conditions,  including heavy menstrual periods, pregnancy, cancer, and bleeding in the digestive tract, to name a few. Iron deficiency anemia is a type of anemia that occurs when there is not enough iron to make the hemoglobin in red blood cells. The main causes of iron deficiency anemia in adults are bleeding and conditions that block iron absorption in the intestines. Iron deficiency anemia can be mild or severe. The condition is common in the [place], especially in women who are still having menstrual periods or have been pregnant. It is less common in men. It is even more common in parts of the world where people cannot get sufficient iron from food and in regions where intestinal parasites are common. This topic will review the signs and symptoms, potential causes, diagnostic tests, and treatment of iron deficiency anemia in adults. WHAT IS ANEMIA?--Anemia is defined as a decreased number of red blood cells (RBCs), as measured by one of the following blood tests: ?Hemoglobin (Hgb) is the iron-containing molecule in RBCs that carries oxygen. Iron is a critical component of hemoglobin; without iron, hemoglobin cannot be formed and fewer RBCs are produced. This is the most accurate of the tests as it is measured on specialized machinery in a laboratory or in the doctor's office. ?Hematocrit (Hct) is the percent of a sample of blood made up of RBCs. The rest of the blood is mostly made up of a fluid called plasma. This test used to be more popular, but the hemoglobin is considered more reliable. ?RBC count is the number of RBCs in a certain amount of whole blood, usually one microliter (one millionth of a liter). Iron deficiency (too little iron) anemia occurs when there is insufficient iron in the body to make hemoglobin. When the quantity of hemoglobin is reduced, fewer RBCs are formed, and the RBCs that are formed are smaller. Symptoms of iron deficiency vary from person to person. Iron deficiency can even  cause symptoms in the absence of anemia. ANEMIA SIGNS AND SYMPTOMS--Many people with iron deficiency anemia have no symptoms at all. Of those who do, the most common symptoms include: ?Weakness ?Headache ?Irritability ?Fatigue ?Difficulty exercising (due to shortness of breath, rapid heartbeat) ?Brittle nails ?Sore tongue ?Restless legs syndrome ?Pica (an abnormal craving to eat non-food items, such as clay or dirt, paper products, or cornstarch) ?Pagophagia (an abnormal craving to eat ice) CAUSES OF ANEMIA--Two common causes of iron deficiency anemia are blood loss (most common) and decreased absorption of iron from  food. Blood loss--The source of blood loss may be obvious, such as in women who have heavy menstrual bleeding, or a person with a known bleeding ulcer. Pregnancy can use up as much as five- to six-fold more iron for the developing fetus and placenta (figure 1). Blood loss during childbirth can also contribute to iron deficiency. In other cases, the source of the blood loss is not visible, as in someone who has chronic bleeding in their gastrointestinal (GI) tract (stomach, small intestine, colon). This may appear as diarrhea with black, tarry stools, or, if the blood loss is very slow, the stool may appear normal. Donating blood can also cause iron deficiency, especially if it is done on a regular basis. Decreased iron absorption--Normally, the body absorbs iron from food through the GI tract. If the GI tract is not functioning correctly, as is the case in people with certain conditions such as celiac disease, autoimmune gastritis, Helicobacter pylori (H. pylori) infection, other forms of stomach inflammation, gastric bypass surgery (for weight loss), or other forms of weight loss surgery, an inadequate amount of iron may be absorbed, leading to iron deficiency anemia. Other causes--In some parts of the world, there is not enough iron available from food, and iron deficiency  may develop due to lack of iron intake. In some countries such as the [place] some foods have added iron (breakfast cereal, bread, pasta). Iron is also available in some plant-based foods. (See 'Iron and diet' below.) ANEMIA DIAGNOSIS--Since iron deficiency occurs before anemia develops, a person may be diagnosed with iron deficiency alone, or with iron deficiency anemia. In some cases, testing is done to evaluate symptoms, and in others it is done for an unrelated reason.  The initial evaluation generally involves a medical history, to look for possible causes of iron deficiency; physical examination, to look for causes and typical findings of iron deficiency; and blood tests, to measure iron stores in the body and check for other possible conditions that could contribute to iron deficiency. This is especially important in early pregnancy, where the likelihood of having iron deficiency is as high as 40 percent.  Complete blood count--A complete blood count (CBC) is a group of tests that includes a red blood cell (RBC) count, hemoglobin (Hgb), and hematocrit (Hct). It also includes the mean corpuscular volume (MCV, referring to the size of the RBCs), mean corpuscular hemoglobin (MCH, referring to the amount of hemoglobin per RBC), and others. It also measures white blood cells and platelets, which are different types of blood cells.  In people with iron deficiency anemia, the RBC count, Hgb, and Hct are lower than normal. The MCV and MCH are usually normal early on but can become lower than normal, indicating that the RBCs are smaller (called microcytic) and carry less Hgb than normal RBCs.  As part of the CBC, the shape, color, and size of the RBCs are also evaluated (either by a machine or by a person using a microscope). This information can help to determine the type of anemia. White blood cells are not affected by iron deficiency. Platelets may be increased in some cases, or they may be  normal. Other blood tests--In many cases, iron deficiency anemia is suspected based upon the results of the medical history and the CBC. Further testing is used to confirm the diagnosis. In some cases, the ferritin can be checked by itself. In others, an "iron studies panel" is used.  ?Ferritin -- Measures a protein that stores iron. This protein decreases  when a person has iron deficiency. The ferritin measurement is most useful when it is low, as nothing other than iron deficiency causes a low ferritin. Ferritin can increase in a variety of conditions unrelated to iron, which can confuse the interpretation of the results. These are generally conditions associated with inflammation, such as chronic rheumatologic disorders or infections. In people with chronic inflammation, the TSAT (see below) and sometimes other specialized testing can be used to see if there is a need for iron replacement.  ?Serum iron -- Measures how much iron is circulating in the blood. The result can be affected by iron supplements and even recent meals. It is not a good measure of the iron stores in the body. ?Total iron binding capacity (TIBC or transferrin) -- Measures the amount of a protein (transferrin) in the blood that is capable of transporting iron to RBCs or body stores. When iron stores are low, the TIBC or transferrin increases. ?Transferrin saturation (TSAT) -- Measures the percentage of iron-binding sites on transferrin that are occupied by iron. This number is calculated by dividing the serum iron by the TIBC. A lower TSAT indicates iron deficiency. If this test is being used to make the diagnosis, it should be done on an overnight fast (after not eating for a night) because iron in foods can make the TSAT appear higher than it should be. In a person with iron deficiency anemia, the serum iron, transferrin saturation, and ferritin are lower than normal and the TIBC may be higher than normal. The ferritin and the TSAT  are the most useful tests. Search for source of blood and iron loss--Once the diagnosis of iron deficiency anemia is made, it is important to identify the cause. The health care provider may ask questions about the following situations, which can increase the risk of developing iron deficiency anemia: ?History of heavy menstrual periods, pregnancies, and deliveries ?Gastrointestinal (GI) problems such as ulcers, Helicobacter pylori (H. pylori) infection, autoimmune gastritis, or celiac disease ?Surgery on the GI tract (such as gastric bypass for weight loss)  ?Family or personal history of bleeding disorders ?Family or personal history of colon cancer ?Multiple blood donations ?Use of medications that can irritate the GI tract, such as nonsteroidal antiinflammatory drugs (NSAIDs), which include ibuprofen (sample brand names: Advil, Motrin) and naproxen (sample brand names: Aleve, Naprosyn)  ?Symptoms of GI bleeding such as dark black, tarry stools, abdominal pain, or visible bleeding If a cause of blood loss is not obvious, additional tests should be done. These include colonoscopy or upper endoscopy to look for areas of bleeding in the GI tract, and blood tests for certain conditions that interfere with iron absorption, such as autoimmune gastritis, celiac disease, and H. pylori infection. Looking for bleeding in the colon is especially important in people over the age of 44. (See "Patient education: Colonoscopy (Beyond the Basics)" and "Patient education: Upper endoscopy (Beyond the Basics)" and "Patient education: Helicobacter pylori infection and treatment (Beyond the Basics)".) ANEMIA TREATMENT--The first step in treating iron deficiency anemia is to determine the cause of the deficiency and correct it, so that iron does not continue to be lost, and any serious condition (for example, colon cancer) is treated as early as possible.  After that, the treatment for iron deficiency anemia is to give  iron. Iron supplements may be taken by mouth or given as one or more intravenous doses (this is sometimes called "parenteral iron" or "IV iron"). Iron is needed to increase production of hemoglobin (Hgb)  and also to rebuild the body's iron reserves. In rare cases, a blood transfusion is needed. In general: ?Oral iron tablets are used in most people with iron deficiency anemia.  ?Intravenous iron can be used for people whose GI tract cannot adequately absorb iron, during the second and third trimester of pregnancy, or, more commonly, in those who are unable to tolerate oral iron. (Some people experience side effects like constipation, nausea, or cramping, which can make the supplements hard to take). Intravenous iron is often given to people with chronic kidney disease. The developing fetus needs iron for normal brain development, and iron deficiency is associated with a number of problems for both mother and child. ?A blood transfusion may be given if a person is actively bleeding and/or the Hgb or hematocrit (Hct) levels are very low, but transfusions are not commonly needed. All of these treatment approaches are discussed more in the following sections. Oral iron--Oral iron tablets are a safe, inexpensive, and effective treatment for people with iron deficiency. Gastrointestinal (GI) side effects are common (see 'Side effects' below). The following tips are recommended when taking oral iron: ?Iron is best absorbed if it is taken every other day (or, for example, on Monday, Wednesday, and Friday), for people who are able to keep track of this type of schedule. ?"Enteric coated" (EC) iron tablets have a special coating that does not dissolve quickly in the GI tract. These are not recommended because iron is best absorbed from the duodenum and jejunum (the first and middle parts of the small intestine), and EC iron releases iron further down in the intestinal tract, where it is not as easily absorbed. In  some cases, an EC iron tablet can pass through the entire intestinal tract with the coating intact, meaning that none of the iron was absorbed. ?Certain foods and medicines can reduce the effectiveness of iron tablets. Iron tablets usually should not be taken with food, certain antibiotics, tea, coffee, calcium supplements, or milk. Iron should be taken one hour before or two hours after these items. If you take antacids, your iron tablets should be taken at least two hours before or four hours after the antacids. Types of oral iron--There are several types of oral iron, and with the exception of the enteric coated (EC) iron tablets mentioned above, they are all equally effective. The primary difference between the types is the amount of iron each product contains. For many products, the number of milligrams for the pill is different from the number of milligrams of actual iron molecules (called "elemental iron"): ?Ferrous fumarate -- 106 mg elemental iron/tablet ?Ferrous sulfate -- 65 mg elemental iron/tablet ?Ferrous sulfate liquid -- 44 mg elemental iron/teaspoon (5 mL) ?Ferrous gluconate -- 28 to 36 mg iron/tablet ?Polysaccharide iron complex - various doses available In the past, iron pills were typically prescribed for daily use, often at multiple doses per day. As noted above, more recent evidence suggests that taking oral iron every other day allows the body to absorb more iron in addition to reducing the risk of gastrointestinal side effects (see 'Side effects' below). Your health care provider can help you figure out the dosing schedule that is most appropriate for you. Side effects--Some people experience a metallic taste, nausea, constipation, stomach upset, dark-colored stools, and/or vomiting after taking oral iron. Options for dealing with these side effects include: ?Take a smaller dose ?Take iron with food (even though this will reduce the amount of iron your body absorbs, it's still  better than  not taking it at all) ?Use a formulation with a lower elemental iron content (eg, ferrous gluconate instead of ferrous sulfate) ?Take the liquid form of ferrous sulfate and adjust the dose until symptoms are tolerable ?Switch to intravenous iron Taking iron tablets will turn the stool a dark, almost black color (actually dark green). This is normal, and does not mean that the iron tablets are causing intestinal bleeding. Children are at particular risk of iron poisoning (overdose), making it very important to store iron tablets out of the reach of children. If you think a child ingested iron pills, call poison control (in the [place], [phone]). Duration of treatment--Treatment with oral iron is recommended for as long as it takes the hemoglobin (Hgb) and hematocrit (Hct), and usually the tests of iron stores, to return to normal. Typically this takes about six months with oral iron and can be accomplished with a single infusion (15 to 60 minutes) with intravenous iron. If oral iron does not increase hemoglobin--On occasion, a person's Hgb will not improve despite treatment with oral iron. There are several possible reasons for this. The next step depends upon why the person's Hgb did not increase, which needs to be evaluated by a clinician. However, several points are worth keeping in mind: ?It is important that iron be taken as directed. Not taking iron as prescribed is probably the most common reason it does not work. ?The type of iron preparation being taken is important. One should avoid any preparation that is labeled "slow release," or is enteric coated (EC), as these may prevent iron from being efficiently absorbed. ?Blood tests may be used to determine if the iron is not being absorbed properly due to another condition such as autoimmune gastritis, celiac disease, or Helicobacter pylori infection. ?In some people, there may be another cause of anemia in addition to  iron deficiency. In others, the diagnosis of iron deficiency may be incorrect. ?If there is ongoing bleeding that depletes iron stores faster than they are being replaced, it may appear that the oral iron is not working. ?For some people, changing to intravenous iron may be a good option. Intravenous iron--Iron may be given by intravenous injection (parenterally, IV) in certain situations, such as in people who cannot tolerate the side effects of oral iron or whose GI tract cannot absorb an adequate amount of iron from pills.  People who may be candidates for intravenous iron due to health conditions include those who: ?Have inflammatory bowel disease ?Have kidney disease ?Have had bariatric (weight loss) surgery  ?Are pregnant, especially in the late second and third trimesters  Intravenous iron is infused into a vein. This is done in a health care provider's office or hospital, where the person can be monitored. The length of time required for the infusion and the number of infusions needed depend on which intravenous iron product is used and the severity of iron deficiency. Side effects--The intravenous iron used in the past (high molecular weight iron dextran [brand name: Dexferrum]) had a risk of severe allergic reactions. However, the intravenous iron products used today have an exceedingly low risk of allergic or anaphylactic reactions (less than one tenth of one percent). Infusion reactions are more common, and may include temporary flushing, back pain, and other symptoms that usually go away when the infusion is slowed or stopped. Some patients with a history of rheumatoid arthritis may have an arthritis flare, which can be reduced or prevented by a short course of steroids. The best ways  to minimize these reactions include avoiding the use of antihistamines as "premedication" or to treat minor symptoms, giving the infusion more slowly, or in some people (those with a history of multiple drug  allergies) giving a steroid before the infusion.  If you have back pain or joint pain at home after the infusion, nonsteroidal antiinflammatory drugs (NSAIDs) may be helpful. NSAIDs include ibuprofen (sample brand names: Advil, Motrin) and naproxen (sample brand name: Aleve). Blood transfusion--Blood transfusion may be used in people with anemia that is severe or causes significant symptoms such as chest pain or difficulty breathing. Blood transfusion involves giving one or more units of packed red blood cells (pRBCs) into a vein. Each unit of pRBCs contains the RBCs from one unit of blood donated by a voluntary donor (with approximately 200 mg of iron) and will raise the Hgb by about 1 gram/deciliter (g/dL; 1 gram per 100 milliliters). Blood transfusions are generally reserved for people who have a low or unstable blood pressure due to bleeding, and/or if the person's organs (brain, heart) are being deprived of oxygen as a result of severe anemia. Typical symptoms of this include chest pain and/or shortness of breath, or in more extreme circumstances, passing out. A transfusion may also be recommended in select cases if the Hgb or hematocrit level is very low (eg, Hgb less than 7 g/dL or hematocrit less than 20 percent), although symptoms are also important. Blood transfusion is described in detail in a separate topic. (See "Patient education: Blood donation and transfusion (Beyond the Basics)".) Side effects--There can be side effects of blood transfusion, with the most common being fever or itching. However, this only occurs in 0.1 to 1 percent of transfusions. More serious or even life-threatening allergic reactions or other complications can occur, although this is even less common. The risk of infection with the hepatitis C virus or the HIV virus is extremely low because of better screening of blood donors as well as improved laboratory testing. These infections occur only once in every two million  transfusions. Iron and diet--Although dietary iron is important in preventing iron deficiency, people with iron deficiency anemia need more iron than they can consume through their diet alone. In a 2000 calorie diet, there is only about 10 mg of elemental iron (compared to 65 mg in one 325 mg ferrous sulfate tablet). Therefore, increasing dietary iron alone is not usually sufficient as a treatment for iron deficiency anemia. Dietary sources of iron are found in meat, especially organ meats, grains, fruits, and vegetables (table 1). For people who do not eat meat, good plant sources of iron include whole or enriched breads or grains, iron-fortified cereals, legumes, green leafy vegetables, dried fruits, soy products, blackstrap molasses, bulgur, and wheat germ.  ANEMIA PREVENTION--People who have had iron deficiency anemia once may be at increased risk for developing it again, depending upon why they developed anemia originally. For example, people who have had weight loss surgery may continue to need iron supplements to maintain the body's iron stores.  Extra iron is commonly included in prenatal multivitamins for pregnant women. However, iron supplements and multivitamins that contain iron should not be taken without consulting a healthcare provider, because too much iron in the body can also cause problems.  Further, the unnecessary use of iron supplements may interfere with the health care provider's ability to identify iron deficiency, which may be a sign of another serious illness such as colon cancer or other gastrointestinal disorders.  Most men and postmenopausal women  do not need supplemental iron unless they have an underlying illness that reduces iron absorption or causes bleeding.

## 2020-04-23 NOTE — Progress Notes (Signed)
Nurse reviewed discharge instructions with pt.  Pt verbalized understanding of discharge instructions, follow up appointments and medications.  No concerns at time of discharge.

## 2020-04-24 ENCOUNTER — Telehealth: Payer: Self-pay

## 2020-04-24 NOTE — Telephone Encounter (Signed)
Ms Sealy is eating, drinking, and urinating well. She has had several BM. Dressing D&I She can remove the Honeycomb dressing 5 days post op which is tomorrow 04-25-20. Her pain level is 2/10 with using just tylenol today.  She has the dilaudid 2 mg tabs if needed. Afebrile. She is aware of her post-op appointments and the office number [phone] if she has any questions or concerns.

## 2020-04-25 ENCOUNTER — Encounter: Payer: Self-pay | Admitting: Gynecologic Oncology

## 2020-04-25 ENCOUNTER — Inpatient Hospital Stay (HOSPITAL_BASED_OUTPATIENT_CLINIC_OR_DEPARTMENT_OTHER): Payer: BC Managed Care – PPO | Admitting: Gynecologic Oncology

## 2020-04-25 ENCOUNTER — Telehealth: Payer: Self-pay | Admitting: *Deleted

## 2020-04-25 DIAGNOSIS — D250 Submucous leiomyoma of uterus: Secondary | ICD-10-CM

## 2020-04-25 LAB — SURGICAL PATHOLOGY

## 2020-04-25 LAB — CYTOLOGY - NON PAP

## 2020-04-25 NOTE — Progress Notes (Signed)
Gynecologic Oncology Telehealth Consult Note: Gyn-Onc  I connected with Paula Evans on 04/25/20 at  9:00 AM EDT by telephone and verified that I am speaking with the correct person using two identifiers.  I discussed the limitations, risks, security and privacy concerns of performing an evaluation and management service by telemedicine and the availability of in-person appointments. I also discussed with the patient that there may be a patient responsible charge related to this service. The patient expressed understanding and agreed to proceed.  Other persons participating in the visit and their role in the encounter: none.  Patient's location: home Provider's location: [facility]  Reason for Visit: follow-up and pathology review  Treatment History: 11/2019 - began having GI symptoms 04/14/20 - presented to [facility] with abdominal symptoms, N/V. Found to have a large complex pelvic mass and extensive abdominal ascites 04/20/20 - exlap, excision of pelvic mass with findings of fundal uterine mass  Interval History: Reports doing well since discharge home. Continues to improve daily. Using tyelnol prn for pain; had to take a PO Dilaudid last night. Denies any fevers, chills, nausea or emesis. Denies vaginal bleeding. Reports return of regular bowel function, voiding freely. Ambulating frequently.  Past Medical/Surgical History: Past Medical History:  Diagnosis Date   Abnormal Pap smear of cervix    Anxiety    Depression    Dysmenorrhea    GERD (gastroesophageal reflux disease)    just occasionally - diet controlled   History of chicken pox    Irritable bowel syndrome (IBS)    Major depression    Ovarian mass    PONV (postoperative nausea and vomiting)     Past Surgical History:  Procedure Laterality Date   BILATERAL SALPINGECTOMY N/A 04/20/2020   Procedure: EXPLORATORY LAPAROTOMY, EXCISION OF PELVIC MASS, MYOMECTOMY;  Surgeon: Lafonda Mosses, MD;  Location: [facility];   Service: Gynecology;  Laterality: N/A;   CERVICAL BIOPSY  W/ LOOP ELECTRODE EXCISION     COLPOSCOPY     ROBOT ASSISTED MYOMECTOMY N/A 04/02/2017   Procedure: ROBOTIC ASSISTED MYOMECTOMY with Morcellation;  Surgeon: Delsa Bern, MD;  Location: [facility];  Service: Gynecology;  Laterality: N/A;   WISDOM TOOTH EXTRACTION  2008    Family History  Problem Relation Age of Onset   Hyperlipidemia Mother    Alcohol abuse Father    Post-traumatic stress disorder Father    Drug abuse Father    Lung cancer Paternal Uncle    Diabetes Paternal Uncle    Breast cancer Maternal Grandmother    Colon polyps Paternal Grandmother    Ovarian cancer Maternal Aunt    Colon cancer Neg Hx    Esophageal cancer Neg Hx    Rectal cancer Neg Hx    Stomach cancer Neg Hx     Social History   Socioeconomic History   Marital status: Single    Spouse name: Not on file   Number of children: 0   Years of education: Not on file   Highest education level: Not on file  Occupational History   Occupation: dining services/chef    Employer: [employer]  Tobacco Use   Smoking status: Former Smoker    Packs/day: 0.25    Years: 4.00    Pack years: 1.00    Types: Cigarettes    Quit date: 10/28/2013    Years since quitting: 6.4   Smokeless tobacco: Never Used  Vaping Use   Vaping Use: Never used  Substance and Sexual Activity   Alcohol use: Yes  Alcohol/week: 3.0 - 4.0 standard drinks    Types: 1 - 2 Cans of beer, 2 Standard drinks or equivalent per week    Comment: occasional   Drug use: No   Sexual activity: Not Currently    Partners: Female    Birth control/protection: None  Other Topics Concern   Not on file  Social History Narrative   Diet: junk food, water, some fruits and veggies, minimum calcium.   Social Determinants of Health   Financial Resource Strain:    Difficulty of Paying Living Expenses:   Food Insecurity:    Worried About Charity fundraiser in the Last Year:     Arboriculturist in the Last Year:   Transportation Needs:    Film/video editor (Medical):    Lack of Transportation (Non-Medical):   Physical Activity:    Days of Exercise per Week:    Minutes of Exercise per Session:   Stress:    Feeling of Stress :   Social Connections:    Frequency of Communication with Friends and Family:    Frequency of Social Gatherings with Friends and Family:    Attends Religious Services:    Active Member of Clubs or Organizations:    Attends Music therapist:    Marital Status:     Current Medications:  Current Outpatient Medications:    acetaminophen (TYLENOL) 500 MG tablet, Take 2 tablets (1,000 mg total) by mouth every 6 (six) hours as needed for moderate pain., Disp: 30 tablet, Rfl: 0   escitalopram (LEXAPRO) 10 MG tablet, Take 1 tablet (10 mg total) by mouth daily., Disp: 90 tablet, Rfl: 0   HYDROmorphone (DILAUDID) 2 MG tablet, Take 1 tablet (2 mg total) by mouth every 4 (four) hours as needed for severe pain., Disp: 30 tablet, Rfl: 0   naproxen sodium (ANAPROX) 550 MG tablet, Take 1 tablet (550 mg total) by mouth 2 (two) times daily with a meal., Disp: 30 tablet, Rfl: 1   ondansetron (ZOFRAN) 4 MG tablet, Take 1 tablet (4 mg total) by mouth every 6 (six) hours as needed for nausea., Disp: 20 tablet, Rfl: 0   pantoprazole (PROTONIX) 40 MG tablet, Take 1 tablet (40 mg total) by mouth daily. Pharmacy-please d/c rx for 30 day script, Disp: 90 tablet, Rfl: 0   simethicone (MYLICON) 80 MG chewable tablet, Chew 1 tablet (80 mg total) by mouth 4 (four) times daily as needed for flatulence (gas)., Disp: 30 tablet, Rfl: 0  Review of Symptoms: Review is negative except as above in Interval History.  Physical Exam: LMP 04/11/2020 (Exact Date)  Deferred given limitations of phone visit.  Laboratory & Radiologic Studies: A. PELVIC MASS, RESECTION:  - Leiomyoma.   COMMENT:   Immunohistochemistry is diffusely and  strongly positive for desmin and  SMA. Calretinin is only focally positive and inhibin is negative.   Assessment & Plan: Paula Evans is a 34 y.o. woman POD#5 from exlap and pelvic mass excision with final pathology revealing leiomyoma.  Patient is doing well from a post-operative standpoint. Reviewed continued restrictions. She was very happy to hear finally pathology results. Aware of follow-up appointment in clinic.   I discussed the assessment and treatment plan with the patient. The patient was provided with an opportunity to ask questions and all were answered. The patient agreed with the plan and demonstrated an understanding of the instructions.   The patient was advised to call back or see an in-person evaluation if the  symptoms worsen or if the condition fails to improve as anticipated.   16 minutes of total time was spent for this patient encounter, including preparation, face-to-face counseling with the patient and coordination of care, and documentation of the encounter.   Jeral Pinch, MD  Division of Gynecologic Oncology  Department of Obstetrics and Gynecology  [facility]

## 2020-04-28 ENCOUNTER — Ambulatory Visit: Payer: BC Managed Care – PPO | Admitting: Gynecologic Oncology

## 2020-04-28 ENCOUNTER — Telehealth: Payer: Self-pay | Admitting: Gynecologic Oncology

## 2020-04-28 ENCOUNTER — Telehealth: Payer: Self-pay | Admitting: *Deleted

## 2020-04-28 DIAGNOSIS — T8149XA Infection following a procedure, other surgical site, initial encounter: Secondary | ICD-10-CM

## 2020-04-28 MED ORDER — SENNOSIDES-DOCUSATE SODIUM 8.6-50 MG PO TABS: 2.0000 | ORAL_TABLET | Freq: Every day | ORAL | 0 refills | Status: DC

## 2020-04-28 MED ORDER — CEPHALEXIN 500 MG PO CAPS: 500.0000 mg | ORAL_CAPSULE | Freq: Four times a day (QID) | ORAL | 0 refills | Status: DC

## 2020-04-28 MED ORDER — HYDROXYZINE HCL 10 MG PO TABS: 10.0000 mg | ORAL_TABLET | Freq: Three times a day (TID) | ORAL | 0 refills | Status: DC | PRN

## 2020-04-28 NOTE — Telephone Encounter (Signed)
Called to follow up with patient about incision erythema.  States she noticed the redness and itching yesterday.  No fever reported but having tingling/chills. The itching/erythema is on her abdomen and under her breasts.  There is increased warmth around the incision but no drainage or increased abdominal pain reported. She has tried itch relief cream with no relief of symptoms along with oral benadryl with little relief. Bladder functioning without difficulty but bowels are slow to move.   Abdominal photos have been reviewed with Dr. Berline Lopes.  Plan to start Keflex QID for ten days for possible cellulitis.  Will prescribe Atarax PRN and sennakot-S to take nightly as well.  Reportable signs and symptoms reviewed. After hours number given and advised to reach out if symptoms worsen or fail to improve.

## 2020-04-28 NOTE — Telephone Encounter (Signed)
Patient called and stated "I had surgery 6/24 with Dr Berline Lopes. I now have a rash around the long incision and up to under both my breast. It is red, inflamed, itches and warm to the touch. No fever. I have tried ice packs, oral benadryl and hydrocortisone cream." Ask patient send pictures through email. Forwarded pictures to [facility] APP

## 2020-05-02 ENCOUNTER — Telehealth: Payer: Self-pay | Admitting: Gynecologic Oncology

## 2020-05-02 ENCOUNTER — Encounter: Payer: Self-pay | Admitting: Gynecologic Oncology

## 2020-05-02 DIAGNOSIS — L270 Generalized skin eruption due to drugs and medicaments taken internally: Secondary | ICD-10-CM

## 2020-05-02 MED ORDER — BETAMETHASONE DIPROPIONATE 0.05 % EX CREA: TOPICAL_CREAM | Freq: Two times a day (BID) | CUTANEOUS | 0 refills | Status: DC

## 2020-05-02 NOTE — Telephone Encounter (Signed)
Called patient and discussed recommendations per Dr. Berline Lopes for topical steroid cream and oral benadryl use.  Advised to contact the office with an update.

## 2020-05-02 NOTE — Telephone Encounter (Signed)
Called to check in with patient.  She states the abdominal itching is "not getting better but not getting worse." The dermabond has come off of her incision and it is weeping slightly.  She continues to itch and states now she has tiny blisters around her incision.  The erythema has improved. The atarax for itching puts her to sleep. Advised I would notify Dr. Berline Lopes of the above and reach back out to her with recommendations/next steps.

## 2020-05-04 ENCOUNTER — Telehealth: Payer: Self-pay

## 2020-05-04 NOTE — Telephone Encounter (Signed)
Paula Evans stated that the itching and blisters have significantly decreased with the steroid cream.  She has not used the benadryl. She does not feel that she needs to come in and have site evaluated by Dr. Berline Lopes. Informed Paula John, NP of the above.

## 2020-05-05 ENCOUNTER — Telehealth: Payer: Self-pay | Admitting: *Deleted

## 2020-05-05 NOTE — Telephone Encounter (Signed)
Returned the patient's call and explained that we have refax her FMLA/LOA form with a MD signature

## 2020-05-08 ENCOUNTER — Telehealth: Payer: Self-pay | Admitting: *Deleted

## 2020-05-08 NOTE — Telephone Encounter (Signed)
Called and left the patient a message to call the office back. Need to move her appt either from 7/15 in the afternoon to the morning; or move it from 7/15 to 7/16

## 2020-05-11 ENCOUNTER — Other Ambulatory Visit: Payer: Self-pay

## 2020-05-11 ENCOUNTER — Inpatient Hospital Stay: Payer: BC Managed Care – PPO | Attending: Gynecologic Oncology | Admitting: Gynecologic Oncology

## 2020-05-11 ENCOUNTER — Ambulatory Visit: Payer: BC Managed Care – PPO | Admitting: Gynecologic Oncology

## 2020-05-11 ENCOUNTER — Inpatient Hospital Stay: Payer: BC Managed Care – PPO

## 2020-05-11 ENCOUNTER — Encounter: Payer: Self-pay | Admitting: Gynecologic Oncology

## 2020-05-11 VITALS — BP 98/74 | HR 71 | Temp 98.4°F | Resp 16 | Ht 64.0 in | Wt 193.4 lb

## 2020-05-11 DIAGNOSIS — R5383 Other fatigue: Secondary | ICD-10-CM

## 2020-05-11 DIAGNOSIS — D259 Leiomyoma of uterus, unspecified: Secondary | ICD-10-CM | POA: Insufficient documentation

## 2020-05-11 DIAGNOSIS — D250 Submucous leiomyoma of uterus: Secondary | ICD-10-CM

## 2020-05-11 DIAGNOSIS — Z9889 Other specified postprocedural states: Secondary | ICD-10-CM

## 2020-05-11 DIAGNOSIS — R42 Dizziness and giddiness: Secondary | ICD-10-CM | POA: Diagnosis not present

## 2020-05-11 LAB — CBC (CANCER CENTER ONLY)
HCT: 33.3 % — ABNORMAL LOW (ref 36.0–46.0)
Hemoglobin: 10.5 g/dL — ABNORMAL LOW (ref 12.0–15.0)
MCH: 26.1 pg (ref 26.0–34.0)
MCHC: 31.5 g/dL (ref 30.0–36.0)
MCV: 82.8 fL (ref 80.0–100.0)
Platelet Count: 313 10*3/uL (ref 150–400)
RBC: 4.02 MIL/uL (ref 3.87–5.11)
RDW: 13.5 % (ref 11.5–15.5)
WBC Count: 5.5 10*3/uL (ref 4.0–10.5)
nRBC: 0 % (ref 0.0–0.2)

## 2020-05-11 NOTE — Progress Notes (Signed)
Gynecologic Oncology Return Clinic Visit  7/15  Reason for Visit: post-op follow-up  Treatment History: Developed abdominal symptoms in 11/2019 6/18: presented to ED with N/V, bloating and constipation. Was found to have a large pelvic mass and significant ascites on CT. 6/24: Exlap, excision of pelvic mass (myomectomy)  Interval History: The patient reports overall doing well since surgery.  About a week and a half ago she developed some erythema around her incisions as well as blisters.  These develop before she started antibiotics.  She was given a prescription for Keflex as well as topical steroid.  After removing the Dermabond and starting topical treatment, the incision improved.  She reports complete resolution of both the erythema and blisters.  She finished her antibiotics on Monday.  She reports having good appetite without any nausea or emesis.  She denies any abdominal pain and reports bowel function has normalized.  She describes having some pressure when emptying her bladder.  She denies any pain or burning.  She describes the sensation as feeling almost as if she has relief.  Occasionally she feels as though she is not emptying her bladder completely.  She notes some instances of weakness and lightheadedness, mainly related to position changes (such as standing up quickly) or related to being out in the heat.  She has been able to complete the daily activities that she needs to do.  Past Medical/Surgical History: Past Medical History:  Diagnosis Date  . Abnormal Pap smear of cervix   . Anxiety   . Depression   . Dysmenorrhea   . GERD (gastroesophageal reflux disease)    just occasionally - diet controlled  . History of chicken pox   . Irritable bowel syndrome (IBS)   . Major depression   . Ovarian mass   . PONV (postoperative nausea and vomiting)     Past Surgical History:  Procedure Laterality Date  . BILATERAL SALPINGECTOMY N/A 04/20/2020   Procedure: EXPLORATORY  LAPAROTOMY, EXCISION OF PELVIC MASS, MYOMECTOMY;  Surgeon: Lafonda Mosses, MD;  Location: [facility];  Service: Gynecology;  Laterality: N/A;  . CERVICAL BIOPSY  W/ LOOP ELECTRODE EXCISION    . COLPOSCOPY    . ROBOT ASSISTED MYOMECTOMY N/A 04/02/2017   Procedure: ROBOTIC ASSISTED MYOMECTOMY with Morcellation;  Surgeon: Delsa Bern, MD;  Location: [facility];  Service: Gynecology;  Laterality: N/A;  . WISDOM TOOTH EXTRACTION  2008    Family History  Problem Relation Age of Onset  . Hyperlipidemia Mother   . Alcohol abuse Father   . Post-traumatic stress disorder Father   . Drug abuse Father   . Lung cancer Paternal Uncle   . Diabetes Paternal Uncle   . Breast cancer Maternal Grandmother   . Colon polyps Paternal Grandmother   . Ovarian cancer Maternal Aunt   . Colon cancer Neg Hx   . Esophageal cancer Neg Hx   . Rectal cancer Neg Hx   . Stomach cancer Neg Hx     Social History   Socioeconomic History  . Marital status: Single    Spouse name: Not on file  . Number of children: 0  . Years of education: Not on file  . Highest education level: Not on file  Occupational History  . Occupation: dining services/chef    Employer: [employer]  . Smoking status: Former Smoker    Packs/day: 0.25    Years: 4.00    Pack years: 1.00    Types: Cigarettes    Quit date: 10/28/2013  Years since quitting: 6.5  . Smokeless tobacco: Never Used  Vaping Use  . Vaping Use: Never used  Substance and Sexual Activity  . Alcohol use: Yes    Alcohol/week: 3.0 - 4.0 standard drinks    Types: 1 - 2 Cans of beer, 2 Standard drinks or equivalent per week    Comment: occasional  . Drug use: No  . Sexual activity: Not Currently    Partners: Female    Birth control/protection: None  Other Topics Concern  . Not on file  Social History Narrative   Diet: junk food, water, some fruits and veggies, minimum calcium.   Social Determinants of Health   Financial Resource Strain:   . Difficulty  of Paying Living Expenses:   Food Insecurity:   . Worried About Charity fundraiser in the Last Year:   . Arboriculturist in the Last Year:   Transportation Needs:   . Film/video editor (Medical):   Marland Kitchen Lack of Transportation (Non-Medical):   Physical Activity:   . Days of Exercise per Week:   . Minutes of Exercise per Session:   Stress:   . Feeling of Stress :   Social Connections:   . Frequency of Communication with Friends and Family:   . Frequency of Social Gatherings with Friends and Family:   . Attends Religious Services:   . Active Member of Clubs or Organizations:   . Attends Archivist Meetings:   Marland Kitchen Marital Status:     Current Medications:  Current Outpatient Medications:  .  betamethasone dipropionate 0.05 % cream, Apply topically 2 (two) times daily. Apply around incision, Disp: 30 g, Rfl: 0 .  escitalopram (LEXAPRO) 10 MG tablet, Take 1 tablet (10 mg total) by mouth daily., Disp: 90 tablet, Rfl: 0 .  pantoprazole (PROTONIX) 40 MG tablet, Take 1 tablet (40 mg total) by mouth daily. Pharmacy-please d/c rx for 30 day script, Disp: 90 tablet, Rfl: 0  Review of Systems: Endorses fatigue, weakness, lightheadedness, pressure when emptying bladder. Denies appetite changes, fevers, chills, unexplained weight changes. Denies hearing loss, neck lumps or masses, mouth sores, ringing in ears or voice changes. Denies cough or wheezing.  Denies shortness of breath. Denies chest pain or palpitations. Denies leg swelling. Denies abdominal distention, pain, blood in stools, constipation, diarrhea, nausea, vomiting, or early satiety. Denies pain with intercourse, dysuria, frequency, hematuria or incontinence. Denies hot flashes, pelvic pain, vaginal bleeding or vaginal discharge.   Denies joint pain, back pain or muscle pain/cramps. Denies itching, rash, or wounds. Denies headaches, numbness or seizures. Denies swollen lymph nodes or glands, denies easy bruising or  bleeding. Denies anxiety, depression, confusion, or decreased concentration.  Physical Exam: BP 98/74 (BP Location: Left Arm, Patient Position: Sitting) Comment (BP Location): manual BP  Pulse 71   Temp 98.4 F (36.9 C) (Oral)   Resp 16   Ht 5\' 4"  (1.626 m)   Wt 193 lb 6.4 oz (87.7 kg)   LMP 04/11/2020 (Exact Date)   SpO2 100%   BMI 33.20 kg/m  General: Alert, oriented, no acute distress. HEENT: Normocephalic, atraumatic, sclera anicteric. Chest: Unlabored breathing on room air. Abdomen: Obese, soft, nontender.  Normoactive bowel sounds.  No masses or hepatosplenomegaly appreciated.  Well-healing incision, no erythema, induration or exudate.  No evidence of skin blisters. Extremities: Grossly normal range of motion.  Warm, well perfused.  No edema bilaterally. Skin: No rashes or lesions noted.  Laboratory & Radiologic Studies: A. PELVIC MASS, RESECTION:  -  Leiomyoma.   FINAL MICROSCOPIC DIAGNOSIS:  - Reactive mesothelial cells present  - Numerous lymphoid cells   Assessment & Plan: Paula Evans is a 34 y.o. woman 3 weeks s/p open myomectomy for a large pedunculated uterine fibroid and extensive ascites.  Patient is overall doing well from a postoperative standpoint although continues to have some fatigue and lightheadedness.  I suspect that this is related to the significant drop that she had in hemoglobin related to blood loss during the surgery as well as to the surgery itself.  We will check a CBC today and I discussed with her starting iron if indicated.  She also describes some pressure when emptying her bladder and feeling as though she may be retaining urine.  I offered to do an in and out catheterization today to see if she is truly retaining urine.  The patient prefers to see if the symptoms improve over the next couple of weeks and will let me know if they do not.  We reviewed again her pathology which shows a benign fibroid.  I have suggested that she discuss with  her GYN being on some sort of hormonal modulation in the future to hopefully help prevent the recurrence and development of further fibroids.  20 minutes of total time was spent for this patient encounter, including preparation, face-to-face counseling with the patient and coordination of care, and documentation of the encounter.  Jeral Pinch, MD  Division of Gynecologic Oncology  Department of Obstetrics and Gynecology  [facility]

## 2020-05-11 NOTE — Patient Instructions (Signed)
You are healing well from surgery!  I will release the results of your CBC today.  If your blood counts are still on the lower side, we can talk about starting you on some iron to help these recover faster.  Remember that your activity restrictions are in place until you are 6 weeks out from surgery.

## 2020-05-23 ENCOUNTER — Ambulatory Visit: Payer: BC Managed Care – PPO | Admitting: Internal Medicine

## 2020-05-23 VITALS — BP 104/78 | HR 78 | Ht 64.0 in | Wt 195.5 lb

## 2020-05-23 DIAGNOSIS — K449 Diaphragmatic hernia without obstruction or gangrene: Secondary | ICD-10-CM | POA: Diagnosis not present

## 2020-05-23 DIAGNOSIS — K2100 Gastro-esophageal reflux disease with esophagitis, without bleeding: Secondary | ICD-10-CM

## 2020-05-23 NOTE — Progress Notes (Signed)
Subjective:    Patient ID: Paula Evans, female    DOB: 07-10-1986, 34 y.o.   MRN: [id number]  HPI Paula Evans is a 34 year old female with a history of GERD with reflux esophagitis, hiatal hernia, and recent discovery of a large adnexal mass with ascites status post resection and found to be a large leiomyoma who is here for follow-up.  She is here alone today.  She came for upper endoscopy on 02/21/2020 to evaluate GERD and abdominal bloating symptom.  She was found to have LA grade B esophagitis and a 3 cm hiatal hernia.  She has been taking pantoprazole 40 mg daily.  She was seen in the ER on 04/14/2020 with severe abdominal pain, increasing abdominal swelling with nausea and vomiting.  CT scan was done and showed a large 19 x 18 x 13 cm complex solid and cystic mass arising from the right adnexa concerning for ovarian cancer.  She was admitted and had surgery with Dr. Berline Lopes to resect this mass but it was found to be a leiomyoma fortunately without malignancy.  Since surgery she is feeling tremendously better.  No further nausea or vomiting.  She has had a good appetite.  Her abdominal incision is healing well.  From a GI perspective she has had mild heartburn occasionally but not daily.  No further abdominal pain.  Much improved bloating.  Good appetite.  Normal bowel habits.  Review of Systems As per HPI, otherwise negative  Current Medications, Allergies, Past Medical History, Past Surgical History, Family History and Social History were reviewed in [employer] record.     Objective:   Physical Exam BP 104/78 (BP Location: Left Arm, Patient Position: Sitting, Cuff Size: Normal)   Pulse 78   Ht 5\' 4"  (1.626 m)   Wt 195 lb 8 oz (88.7 kg)   SpO2 98%   BMI 33.56 kg/m  Gen: awake, alert, NAD HEENT: anicteric CV: RRR, no mrg Pulm: CTA b/l Abd: soft, NT/ND, +BS throughout Ext: no c/c/e Neuro: nonfocal  CT ABDOMEN AND PELVIS WITH CONTRAST    TECHNIQUE: Multidetector CT imaging of the abdomen and pelvis was performed using the standard protocol following bolus administration of intravenous contrast.   CONTRAST:  173mL OMNIPAQUE IOHEXOL 300 MG/ML  SOLN   COMPARISON:  None.   FINDINGS: Lower chest: Normal.   Hepatobiliary: No focal liver abnormality is seen. No gallstones, gallbladder wall thickening, or biliary dilatation.   Pancreas: Unremarkable. No pancreatic ductal dilatation or surrounding inflammatory changes.   Spleen: Normal in size without focal abnormality.   Adrenals/Urinary Tract: Adrenal glands are unremarkable. Kidneys are normal, without renal calculi, focal lesion, or hydronephrosis. Bladder is unremarkable.   Stomach/Bowel: Small hiatal hernia. Stomach is otherwise normal. Appendix is not visualized. No evidence of bowel wall thickening, distention, or inflammatory changes.   Vascular/Lymphatic: No significant vascular findings are present. No enlarged abdominal or pelvic lymph nodes.   Reproductive: There is a 19 x 18 x 13 cm complex mixed solid and cystic mass arising from the right adnexa, consistent with ovarian carcinoma. 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 The uterus is inhomogeneous with multiple small lesions which are likely to represent fibroids. The right ovarian   Tumor deviates the uterus anteriorly and to the left.   Other: There is extensive ascites throughout the abdomen. The ascites extends into the middle mediastinum adjacent to the distal esophagus.   Musculoskeletal: No acute abnormality. Bilateral pars defects at L5.  IMPRESSION: 1. 19 x 18 x 13 cm complex mixed solid and cystic mass arising from the right adnexa consistent with ovarian carcinoma. 2. 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 3. Extensive ascites throughout the abdomen likely secondary to the ovarian tumor.     Electronically Signed   By: Lorriane Shire M.D.   On: 04/14/2020 13:58           Assessment & Plan:  34 year old female with a history of GERD with reflux esophagitis, hiatal hernia, and recent discovery of a large adnexal mass with ascites status post resection and found to be a large leiomyoma who is here for follow-up.   1.  GERD with esophagitis/hiatal hernia --I am going to continue her on pantoprazole 40 mg daily for approximately 3 additional months.  I will see her back at that time and we can consider stopping PPI.  If recurrent heartburn symptoms then we can resume PPI or consider hiatal hernia repair/Nissen fundoplication.  GERD diet and lifestyle precautions recommended.  2.  Leiomyoma --large benign leiomyoma tumor status post resection.  She will be following with gynecology.  20 minutes total spent today including patient facing time, coordination of care, reviewing medical history/procedures/pertinent radiology studies, and documentation of the encounter.

## 2020-05-23 NOTE — Patient Instructions (Signed)
Continue pantoprazole 40 mg daily.  Please follow up with Dr Hilarie Fredrickson in 4 months in office.  If you are age 34 or older, your body mass index should be between 23-30. Your Body mass index is 33.56 kg/m. If this is out of the aforementioned range listed, please consider follow up with your Primary Care Provider.  If you are age 47 or younger, your body mass index should be between 19-25. Your Body mass index is 33.56 kg/m. If this is out of the aformentioned range listed, please consider follow up with your Primary Care Provider.   Due to recent changes in healthcare laws, you may see the results of your imaging and laboratory studies on MyChart before your provider has had a chance to review them.  We understand that in some cases there may be results that are confusing or concerning to you. Not all laboratory results come back in the same time frame and the provider may be waiting for multiple results in order to interpret others.  Please give [place] 48 hours in order for your provider to thoroughly review all the results before contacting the office for clarification of your results.

## 2020-06-01 ENCOUNTER — Other Ambulatory Visit: Payer: Self-pay | Admitting: Internal Medicine

## 2020-06-09 ENCOUNTER — Encounter: Payer: Self-pay | Admitting: Gynecologic Oncology

## 2020-06-23 DIAGNOSIS — Z6832 Body mass index (BMI) 32.0-32.9, adult: Secondary | ICD-10-CM | POA: Diagnosis not present

## 2020-06-23 DIAGNOSIS — Z01419 Encounter for gynecological examination (general) (routine) without abnormal findings: Secondary | ICD-10-CM | POA: Diagnosis not present

## 2020-06-23 DIAGNOSIS — D259 Leiomyoma of uterus, unspecified: Secondary | ICD-10-CM | POA: Diagnosis not present

## 2020-06-23 DIAGNOSIS — N6452 Nipple discharge: Secondary | ICD-10-CM | POA: Diagnosis not present

## 2020-06-23 DIAGNOSIS — Z3040 Encounter for surveillance of contraceptives, unspecified: Secondary | ICD-10-CM | POA: Diagnosis not present

## 2020-06-29 ENCOUNTER — Other Ambulatory Visit: Payer: Self-pay | Admitting: Obstetrics and Gynecology

## 2020-06-29 DIAGNOSIS — E221 Hyperprolactinemia: Secondary | ICD-10-CM

## 2020-07-06 DIAGNOSIS — Z3040 Encounter for surveillance of contraceptives, unspecified: Secondary | ICD-10-CM | POA: Diagnosis not present

## 2020-07-06 DIAGNOSIS — N643 Galactorrhea not associated with childbirth: Secondary | ICD-10-CM | POA: Diagnosis not present

## 2020-07-06 DIAGNOSIS — E221 Hyperprolactinemia: Secondary | ICD-10-CM | POA: Diagnosis not present

## 2020-07-07 ENCOUNTER — Encounter (HOSPITAL_COMMUNITY): Payer: Self-pay | Admitting: Psychiatry

## 2020-07-07 ENCOUNTER — Telehealth (INDEPENDENT_AMBULATORY_CARE_PROVIDER_SITE_OTHER): Payer: BC Managed Care – PPO | Admitting: Psychiatry

## 2020-07-07 ENCOUNTER — Telehealth: Payer: Self-pay | Admitting: Gynecologic Oncology

## 2020-07-07 DIAGNOSIS — F411 Generalized anxiety disorder: Secondary | ICD-10-CM | POA: Diagnosis not present

## 2020-07-07 DIAGNOSIS — R4586 Emotional lability: Secondary | ICD-10-CM | POA: Diagnosis not present

## 2020-07-07 DIAGNOSIS — F332 Major depressive disorder, recurrent severe without psychotic features: Secondary | ICD-10-CM

## 2020-07-07 MED ORDER — ESCITALOPRAM OXALATE 10 MG PO TABS: 10.0000 mg | ORAL_TABLET | Freq: Every day | ORAL | 0 refills | Status: DC

## 2020-07-07 NOTE — Progress Notes (Signed)
[place] Follow up visit   Patient Identification: Paula Evans MRN:  [id number] Date of Evaluation:  07/07/2020 Referral Source: Therapist and primary care Chief Complaint:   Depression follow up    I connected with [name] on 07/07/20 at 11:00 AM EDT by a video enabled telemedicine application and verified that I am speaking with the correct person using two identifiers. Patient location home Provider location: home office   I discussed the limitations of evaluation and management by telemedicine and the availability of in person appointments. The patient expressed understanding and agreed to proceed. Visit Diagnosis:    ICD-10-CM   1. Severe episode of recurrent major depressive disorder, without psychotic features ([place])  F33.2   2. GAD (generalized anxiety disorder)  F41.1   3. Mood swings  R45.86     History of Present Illness: Patient is a 34  years old single African-American female.  Has had a difficult year with the pandemic and multiple relocation or move into apartments. 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  Multiple moves  Modifying factor: mom, sibling sister, current job less stressful  Duration since 2011.  Hospital admission for depression 2017   Severity not worse      Past Psychiatric History: depression   Past Medical History:  Past Medical History:  Diagnosis Date  . Abnormal Pap smear of cervix   . Anxiety   . Depression   . Dysmenorrhea   . Esophagitis   . GERD (gastroesophageal reflux disease)    just occasionally - diet controlled  . Hiatal hernia   . History of chicken pox   . Irritable bowel syndrome (IBS)   . Major depression   . Ovarian mass   . PONV  (postoperative nausea and vomiting)     Past Surgical History:  Procedure Laterality Date  . BILATERAL SALPINGECTOMY N/A 04/20/2020   Procedure: EXPLORATORY LAPAROTOMY, EXCISION OF PELVIC MASS, MYOMECTOMY;  Surgeon: Lafonda Mosses, MD;  Location: [facility];  Service: Gynecology;  Laterality: N/A;  . CERVICAL BIOPSY  W/ LOOP ELECTRODE EXCISION    . COLPOSCOPY    . ROBOT ASSISTED MYOMECTOMY N/A 04/02/2017   Procedure: ROBOTIC ASSISTED MYOMECTOMY with Morcellation;  Surgeon: Delsa Bern, MD;  Location: [facility];  Service: Gynecology;  Laterality: N/A;  . WISDOM TOOTH EXTRACTION  2008    Family Psychiatric History: dad: depression, ptsd, alcohol use  Family History:  Family History  Problem Relation Age of Onset  . Hyperlipidemia Mother   . Alcohol abuse Father   . Post-traumatic stress disorder Father   . Drug abuse Father   . Lung cancer Paternal Uncle   . Diabetes Paternal Uncle   . Breast cancer Maternal Grandmother   . Colon polyps Paternal Grandmother   . Ovarian cancer Maternal Aunt   . Colon cancer Neg Hx   . Esophageal cancer Neg Hx   . Rectal cancer Neg Hx   . Stomach cancer Neg Hx     Social History:   Social History   Socioeconomic History  . Marital status: Single    Spouse name: Not on file  . Number of children: 0  . Years of education: Not on file  . Highest  education level: Not on file  Occupational History  . Occupation: dining services/chef    Employer: [employer]  . Smoking status: Former Smoker    Packs/day: 0.25    Years: 4.00    Pack years: 1.00    Types: Cigarettes    Quit date: 10/28/2013    Years since quitting: 6.6  . Smokeless tobacco: Never Used  Vaping Use  . Vaping Use: Never used  Substance and Sexual Activity  . Alcohol use: Yes    Alcohol/week: 3.0 - 4.0 standard drinks    Types: 1 - 2 Cans of beer, 2 Standard drinks or equivalent per week    Comment: occasional  . Drug use: No  . Sexual activity: Not Currently     Partners: Female    Birth control/protection: None  Other Topics Concern  . Not on file  Social History Narrative   Diet: junk food, water, some fruits and veggies, minimum calcium.   Social Determinants of Health   Financial Resource Strain:   . Difficulty of Paying Living Expenses: Not on file  Food Insecurity:   . Worried About Charity fundraiser in the Last Year: Not on file  . Ran Out of Food in the Last Year: Not on file  Transportation Needs:   . Lack of Transportation (Medical): Not on file  . Lack of Transportation (Non-Medical): Not on file  Physical Activity:   . Days of Exercise per Week: Not on file  . Minutes of Exercise per Session: Not on file  Stress:   . Feeling of Stress : Not on file  Social Connections:   . Frequency of Communication with Friends and Family: Not on file  . Frequency of Social Gatherings with Friends and Family: Not on file  . Attends Religious Services: Not on file  . Active Member of Clubs or Organizations: Not on file  . Attends Archivist Meetings: Not on file  . Marital Status: Not on file    Allergies:   Allergies  Allergen Reactions  . Other Dermatitis    dermabond    Metabolic Disorder Labs: No results found for: HGBA1C, MPG No results found for: PROLACTIN Lab Results  Component Value Date   CHOL 154 05/17/2019   TRIG 101.0 05/17/2019   HDL 52.00 05/17/2019   CHOLHDL 3 05/17/2019   VLDL 20.2 05/17/2019   LDLCALC 81 05/17/2019   LDLCALC 99 01/16/2018   No results found for: TSH  Therapeutic Level Labs: No results found for: LITHIUM No results found for: CBMZ No results found for: VALPROATE  Current Medications: Current Outpatient Medications  Medication Sig Dispense Refill  . escitalopram (LEXAPRO) 10 MG tablet Take 1 tablet (10 mg total) by mouth daily. 90 tablet 0  . pantoprazole (PROTONIX) 40 MG tablet TAKE 1 TABLET (40 MG TOTAL) BY MOUTH DAILY 90 tablet 0   No current facility-administered  medications for this visit.     Psychiatric Specialty Exam: Review of Systems  Cardiovascular: Negative for chest pain.  Skin: Negative for rash.  Psychiatric/Behavioral: Negative for substance abuse and suicidal ideas.    There were no vitals taken for this visit.There is no height or weight on file to calculate BMI.  General Appearance: Casual  Eye Contact:  Fair  Speech:  Slow  Volume:  Normal  Mood:fair  Affect:  Congruent  Thought Process:  Goal Directed  Orientation:  Full (Time, Place, and Person)  Thought Content:  Logical  Suicidal Thoughts:  No  Homicidal Thoughts:  No  Memory:  Immediate;   Fair Recent;   Fair  Judgement:  Fair  Insight:  Fair  Psychomotor Activity:  Normal  Concentration:  Concentration: Fair and Attention Span: Fair  Recall:  [employer] of Knowledge:Good  Language: Good  Akathisia:  No  Handed:  Right  AIMS (if indicated):  not done  Assets:  Desire for Improvement Physical Health  ADL's:  Intact  Cognition: WNL  Sleep:  Fair   Screenings: GAD-7     Office Visit from 06/17/2019 in [place] at [facility]  Total GAD-7 Score 10    PHQ2-9     Office Visit from 06/17/2019 in [place] at [place] from 01/20/2018 in [place] [facility] from 01/16/2017 in [place] from 05/31/2016 in [place] at [facility]  PHQ-2 Total Score 1 0 3 1  PHQ-9 Total Score 4 -- -- 10      Assessment and Plan: as follows MDD moderate: recurrent : doing fair , continue lexapro  [id number] on lexapro, continue therapy if needed  to deal with past traumas  Mood symptoms may be part of depression . No clear manic symptoms in past.   I discussed the assessment and treatment plan with the patient. The patient was provided an opportunity to ask questions and all were answered. The patient agreed with the plan and demonstrated an understanding of the  instructions.   The patient was advised to call back or seek an in-person evaluation if the symptoms worsen or if the condition fails to improve as anticipated. Fu 42m.  Time spent non face to face 10 min.  Merian Capron, MD 9/10/202111:04 AM

## 2020-07-07 NOTE — Telephone Encounter (Signed)
Release: [id number] Faxed medical records to [facility] @ fax [phone]

## 2020-07-22 ENCOUNTER — Ambulatory Visit
Admission: RE | Admit: 2020-07-22 | Discharge: 2020-07-22 | Disposition: A | Discharge status: Home / Self Care | Payer: BC Managed Care – PPO | Source: Ambulatory Visit | Attending: Obstetrics and Gynecology | Admitting: Obstetrics and Gynecology

## 2020-07-22 DIAGNOSIS — E221 Hyperprolactinemia: Secondary | ICD-10-CM

## 2020-07-22 MED ORDER — GADOBENATE DIMEGLUMINE 529 MG/ML IV SOLN
15.0000 mL | Freq: Once | INTRAVENOUS | Status: AC | PRN
  Administered 2020-07-22: 15 mL via INTRAVENOUS

## 2020-08-01 DIAGNOSIS — D352 Benign neoplasm of pituitary gland: Secondary | ICD-10-CM | POA: Diagnosis not present

## 2020-09-05 DIAGNOSIS — D352 Benign neoplasm of pituitary gland: Secondary | ICD-10-CM | POA: Diagnosis not present

## 2020-09-06 ENCOUNTER — Other Ambulatory Visit: Payer: Self-pay | Admitting: Internal Medicine

## 2020-09-08 ENCOUNTER — Telehealth: Payer: Self-pay | Admitting: Internal Medicine

## 2020-09-08 NOTE — Telephone Encounter (Signed)
Patient needs to schedule office visit follow up as indicated on her last refill request. She was to follow up 4 months after her last visit with Dr Hilarie Fredrickson. Thanks

## 2020-10-04 ENCOUNTER — Other Ambulatory Visit (HOSPITAL_COMMUNITY): Payer: Self-pay | Admitting: Psychiatry

## 2020-10-05 ENCOUNTER — Other Ambulatory Visit (HOSPITAL_COMMUNITY): Payer: Self-pay | Admitting: Psychiatry

## 2020-11-03 ENCOUNTER — Encounter (HOSPITAL_COMMUNITY): Payer: Self-pay | Admitting: Psychiatry

## 2020-11-03 ENCOUNTER — Telehealth (INDEPENDENT_AMBULATORY_CARE_PROVIDER_SITE_OTHER): Payer: BC Managed Care – PPO | Admitting: Psychiatry

## 2020-11-03 DIAGNOSIS — F332 Major depressive disorder, recurrent severe without psychotic features: Secondary | ICD-10-CM | POA: Diagnosis not present

## 2020-11-03 DIAGNOSIS — F411 Generalized anxiety disorder: Secondary | ICD-10-CM

## 2020-11-03 MED ORDER — ESCITALOPRAM OXALATE 10 MG PO TABS: 10.0000 mg | ORAL_TABLET | Freq: Every day | ORAL | 0 refills | Status: DC

## 2020-11-03 NOTE — Progress Notes (Signed)
[place] Follow up visit   Patient Identification: Paula Evans MRN:  [id number] Date of Evaluation:  11/03/2020 Referral Source: Therapist and primary care Chief Complaint:   Depression follow up     I connected with Paula Evans on 11/03/20 at 10:00 AM EST by telephone and verified that I am speaking with the correct person using two identifiers. Patient location home Provider location: home office   I discussed the limitations of evaluation and management by telemedicine and the availability of in person appointments. The patient expressed understanding and agreed to proceed. Visit Diagnosis:    ICD-10-CM   1. Severe episode of recurrent major depressive disorder, without psychotic features (Paula Evans)  F33.2   2. GAD (generalized anxiety disorder)  F41.1     History of Present Illness: Patient is a 35  years old single African-American female.  Has had a difficult year with the pandemic and multiple relocation or move into apartments. 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  Multiple moves  Modifying factor: mom, sibling sister, current job less stressful  Duration since 2011.  Hospital admission for depression 2017   Severity not worse      Past Psychiatric History: depression   Past Medical History:  Past Medical History:  Diagnosis Date  . Abnormal Pap smear of cervix   . Anxiety   . Depression   . Dysmenorrhea   . Esophagitis   . GERD (gastroesophageal reflux disease)    just occasionally - diet controlled  . Hiatal hernia   . History of chicken pox   . Irritable bowel syndrome (IBS)   . Major depression   . Ovarian mass   . PONV (postoperative nausea and vomiting)     Past Surgical History:   Procedure Laterality Date  . BILATERAL SALPINGECTOMY N/A 04/20/2020   Procedure: EXPLORATORY LAPAROTOMY, EXCISION OF PELVIC MASS, MYOMECTOMY;  Surgeon: Lafonda Mosses, MD;  Location: [facility];  Service: Gynecology;  Laterality: N/A;  . CERVICAL BIOPSY  W/ LOOP ELECTRODE EXCISION    . COLPOSCOPY    . ROBOT ASSISTED MYOMECTOMY N/A 04/02/2017   Procedure: ROBOTIC ASSISTED MYOMECTOMY with Morcellation;  Surgeon: Delsa Bern, MD;  Location: [place] [facility];  Service: Gynecology;  Laterality: N/A;  . WISDOM TOOTH EXTRACTION  2008    Family Psychiatric History: dad: depression, ptsd, alcohol use  Family History:  Family History  Problem Relation Age of Onset  . Hyperlipidemia Mother   . Alcohol abuse Father   . Post-traumatic stress disorder Father   . Drug abuse Father   . Lung cancer Paternal Uncle   . Diabetes Paternal Uncle   . Breast cancer Maternal Grandmother   . Colon polyps Paternal Grandmother   . Ovarian cancer Maternal Aunt   . Colon cancer Neg Hx   . Esophageal cancer Neg Hx   . Rectal cancer Neg Hx   . Stomach cancer Neg Hx     Social History:   Social History   Socioeconomic History  . Marital status: Single    Spouse name: Not on file  . Number of children: 0  . Years of education: Not on file  . Highest education level: Not on file  Occupational History  . Occupation: dining  services/chef    Employer: [employer]  Tobacco Use  . Smoking status: Former Smoker    Packs/day: 0.25    Years: 4.00    Pack years: 1.00    Types: Cigarettes    Quit date: 10/28/2013    Years since quitting: 7.0  . Smokeless tobacco: Never Used  Vaping Use  . Vaping Use: Never used  Substance and Sexual Activity  . Alcohol use: Yes    Alcohol/week: 3.0 - 4.0 standard drinks    Types: 1 - 2 Cans of beer, 2 Standard drinks or equivalent per week    Comment: occasional  . Drug use: No  . Sexual activity: Not Currently    Partners: Female    Birth control/protection: None  Other Topics  Concern  . Not on file  Social History Narrative   Diet: junk food, water, some fruits and veggies, minimum calcium.   Social Determinants of Health   Financial Resource Strain: Not on file  Food Insecurity: Not on file  Transportation Needs: Not on file  Physical Activity: Not on file  Stress: Not on file  Social Connections: Not on file    Allergies:   Allergies  Allergen Reactions  . Other Dermatitis    dermabond    Metabolic Disorder Labs: No results found for: HGBA1C, MPG No results found for: PROLACTIN Lab Results  Component Value Date   CHOL 154 05/17/2019   TRIG 101.0 05/17/2019   HDL 52.00 05/17/2019   CHOLHDL 3 05/17/2019   VLDL 20.2 05/17/2019   LDLCALC 81 05/17/2019   LDLCALC 99 01/16/2018   No results found for: TSH  Therapeutic Level Labs: No results found for: LITHIUM No results found for: CBMZ No results found for: VALPROATE  Current Medications: Current Outpatient Medications  Medication Sig Dispense Refill  . escitalopram (LEXAPRO) 10 MG tablet Take 1 tablet (10 mg total) by mouth daily. 90 tablet 0  . pantoprazole (PROTONIX) 40 MG tablet TAKE 1 TABLET (40 MG TOTAL) BY MOUTH DAILY 90 tablet 0   No current facility-administered medications for this visit.     Psychiatric Specialty Exam: Review of Systems  Cardiovascular: Negative for chest pain.  Skin: Negative for rash.  Psychiatric/Behavioral: Negative for substance abuse and suicidal ideas.    There were no vitals taken for this visit.There is no height or weight on file to calculate BMI.  General Appearance:   Eye Contact:   Speech:  Slow  Volume:  Normal  Mood: fair  Affect:  Congruent  Thought Process:  Goal Directed  Orientation:  Full (Time, Place, and Person)  Thought Content:  Logical  Suicidal Thoughts:  No  Homicidal Thoughts:  No  Memory:  Immediate;   Fair Recent;   Fair  Judgement:  Fair  Insight:  Fair  Psychomotor Activity:  Normal  Concentration:   Concentration: Fair and Attention Span: Fair  Recall:  [employer] of Knowledge:Good  Language: Good  Akathisia:  No  Handed:  Right  AIMS (if indicated):  not done  Assets:  Desire for Improvement Physical Health  ADL's:  Intact  Cognition: WNL  Sleep:  Fair   Screenings: GAD-7   Flowsheet Row Office Visit from 06/17/2019 in [place] at [facility]  Total GAD-7 Score 10    PHQ2-9   [place] from 06/17/2019 in [place] at [facility] Visit from 01/20/2018 in [place] at [facility] from 01/16/2017 in [place] Office Visit from  05/31/2016 in [facility] at [facility] Total Score 1 0 3 1  PHQ-9 Total Score 4 - - 10      Assessment and Plan: as follows MDD moderate: recurrent :  Doing fair, continue lexapro. Discussed withdrawals should be minimal as dose is low but to make sure has enough before appointments,  [id number] on lexapro, continue therapy if needed  to deal with past traumas  Mood symptoms may be part of depression . No clear manic symptoms in past.   I discussed the assessment and treatment plan with the patient. The patient was provided an opportunity to ask questions and all were answered. The patient agreed with the plan and demonstrated an understanding of the instructions.   The patient was advised to call back or seek an in-person evaluation if the symptoms worsen or if the condition fails to improve as anticipated. Fu 21m.  Time spent non face to face 10 min.  Merian Capron, MD 1/7/202210:15 AM

## 2020-11-16 ENCOUNTER — Ambulatory Visit: Payer: BC Managed Care – PPO | Admitting: Internal Medicine

## 2020-11-16 ENCOUNTER — Encounter: Payer: Self-pay | Admitting: Internal Medicine

## 2020-11-16 ENCOUNTER — Other Ambulatory Visit: Payer: Self-pay

## 2020-11-16 VITALS — BP 122/82 | HR 82 | Ht 64.0 in | Wt 205.2 lb

## 2020-11-16 DIAGNOSIS — K2100 Gastro-esophageal reflux disease with esophagitis, without bleeding: Secondary | ICD-10-CM | POA: Diagnosis not present

## 2020-11-16 DIAGNOSIS — K449 Diaphragmatic hernia without obstruction or gangrene: Secondary | ICD-10-CM | POA: Diagnosis not present

## 2020-11-16 MED ORDER — PANTOPRAZOLE SODIUM 40 MG PO TBEC: DELAYED_RELEASE_TABLET | ORAL | 0 refills | Status: DC

## 2020-11-16 NOTE — Patient Instructions (Addendum)
You have been tentatively scheduled with Dr Bryan Lemma on 12/04/20 at 8:40 am to discuss TIF procedure.   We have sent the following medications to your pharmacy for you to pick up at your convenience: Pantoprazole 40 mg daily.  Purchase pepcid to take as needed.  If you are age 35 or older, your body mass index should be between 23-30. Your Body mass index is 35.22 kg/m. If this is out of the aforementioned range listed, please consider follow up with your Primary Care Provider.  If you are age 68 or younger, your body mass index should be between 19-25. Your Body mass index is 35.22 kg/m. If this is out of the aformentioned range listed, please consider follow up with your Primary Care Provider.   Due to recent changes in healthcare laws, you may see the results of your imaging and laboratory studies on MyChart before your provider has had a chance to review them.  We understand that in some cases there may be results that are confusing or concerning to you. Not all laboratory results come back in the same time frame and the provider may be waiting for multiple results in order to interpret others.  Please give [place] 48 hours in order for your provider to thoroughly review all the results before contacting the office for clarification of your results.

## 2020-11-16 NOTE — Progress Notes (Signed)
   Subjective:    Patient ID: Paula Evans, female    DOB: 30-Nov-1985, 35 y.o.   MRN: [id number]  HPI Paula Evans is a 35 year old female with a history of GERD with reflux esophagitis, hiatal hernia, leiomyoma status postresection who is here for follow-up.  She was last seen in July 2021.  She is here alone today.  She reports that with pantoprazole her heartburn is mostly controlled.  She tried a period of time without PPI and noticed very frequent heartburn, regurgitation and nausea symptom.  We have frequent heartburn symptoms at night which will wake her from sleep.  With pantoprazole she reports symptoms as controlled with breakthrough heartburn in the evening about 1 day/week.  She has been taking an extra dose of pantoprazole when needed on these days.  No dysphagia or odynophagia.  No abdominal pain.  Bowel habits are regular.  She is interested in antireflux surgery.  Review of Systems As per HPI, otherwise negative    Objective:   Physical Exam BP 122/82 (BP Location: Left Arm, Patient Position: Sitting)   Pulse 82   Ht 5\' 4"  (1.626 m)   Wt 205 lb 3.2 oz (93.1 kg)   SpO2 98%   BMI 35.22 kg/m  Gen: awake, alert, NAD HEENT: anicteric  Neuro: nonfocal      Assessment & Plan:  35 year old female with a history of GERD with reflux esophagitis, hiatal hernia, leiomyoma status postresection who is here for follow-up.  1. GERD with hx of esophagitis/hiatal hernia  --history of GERD with esophagitis documented by upper endoscopy in April 2021.  She does have a small hiatal hernia which I measured at 3 cm.  Pantoprazole is working well but she would prefer to be off of antireflux therapy if at all possible.  She is having some breakthrough which I have recommended famotidine on those occasions.  We spent time today discussing hiatal hernia repair, Nissen fundoplication and also TIF.  It is possible that I slightly overestimated the size of her hernia and that she would be  appropriate for TIF without hiatal hernia repair.  She is most interested in incisionless approach.  I recommended that she visit with Dr. Bryan Lemma, my colleague, to discuss her candidacy for TIF.  I let her know that he may choose to repeat upper endoscopy to reassess hiatal hernia, ensure healing of esophagitis, and to best determine her candidacy for possible TIF.  I also let her know that if hiatal hernia needs repair the Dr. Bryan Lemma can do a combined hiatal hernia repair and TIF with one of our surgeons.  I also let her know she would likely need esophageal manometry before any antireflux procedure or surgery. -- Continue pantoprazole 40 mg daily -- Add famotidine 20 mg every afternoon as needed for breakthrough -- Opinion and visit with Dr. Bryan Lemma regarding TIF

## 2020-11-30 ENCOUNTER — Ambulatory Visit: Payer: BC Managed Care – PPO | Admitting: Gastroenterology

## 2020-12-04 ENCOUNTER — Encounter: Payer: Self-pay | Admitting: Gastroenterology

## 2020-12-04 ENCOUNTER — Ambulatory Visit: Payer: BC Managed Care – PPO | Admitting: Gastroenterology

## 2020-12-04 VITALS — BP 102/72 | HR 71 | Ht 64.0 in | Wt 207.5 lb

## 2020-12-04 DIAGNOSIS — K449 Diaphragmatic hernia without obstruction or gangrene: Secondary | ICD-10-CM

## 2020-12-04 DIAGNOSIS — K2100 Gastro-esophageal reflux disease with esophagitis, without bleeding: Secondary | ICD-10-CM | POA: Diagnosis not present

## 2020-12-04 NOTE — Progress Notes (Signed)
P  Chief Complaint:    GERD with erosive esophagitis  HPI:     Paula Evans is a 35 year old female with a long-standing history of GERD with erosive esophagitis referred to me by Dr. Hilarie Fredrickson for evaluation of possible antireflux intervention with Transoral Incisionless Fundoplication (TIF) with a goal to stop or significantly reduce acid suppression therapy.  She will still have breakthrough 1 day/week despite continued use of pantoprazole.  Takes Pepcid prn breakthrough.  GERD history: -Index symptoms: Heartburn, regurgitation, nausea.  Nocturnal regurgitation, choking.  No dysphagia. -Exacerbating features: Forward flexion, chocolate, red sauces, spicy -Medications trialed: Prilosec, Nexium, Prevacid, Zantac, Tums -Current medications: Protonix 40 mg/day, famotidine 20 mg prn breakthrough (takes 1-2 nights/week) -Complications: Erosive esophagitis, hiatal hernia  GERD evaluation: -Last EGD: 01/2020 -Barium esophagram: None -Esophageal Manometry: Ordering today -pH/Impedance: N/A -Bravo: N/A  Endoscopic History: -EGD (01/2020): 3 cm HH, LA Grade B esophagitis, Hill grade 3-4 valve. Normal stomach, duodenum   GERD-HRQL Questionnaire Score: 29/50, marked "dissatisfied" with current health condition  Maternal grandfather with stomach cancer. Paternal grandmother with colon resection for unknown reason.    Review of systems:     No chest pain, no SOB, no fevers, no urinary sx   Past Medical History:  Diagnosis Date  . Abnormal Pap smear of cervix   . Anxiety   . Depression   . Dysmenorrhea   . Esophagitis   . GERD (gastroesophageal reflux disease)    just occasionally - diet controlled  . Hiatal hernia   . History of chicken pox   . Irritable bowel syndrome (IBS)   . Major depression   . Ovarian mass   . PONV (postoperative nausea and vomiting)     Patient's surgical history, family medical history, social history, medications and allergies were all reviewed in  Epic    Current Outpatient Medications  Medication Sig Dispense Refill  . Cabergoline (DOSTINEX PO) Take by mouth. 1/2 tablet twice a week    . escitalopram (LEXAPRO) 10 MG tablet Take 1 tablet (10 mg total) by mouth daily. 90 tablet 0  . pantoprazole (PROTONIX) 40 MG tablet TAKE 1 TABLET (40 MG TOTAL) BY MOUTH DAILY 90 tablet 0   No current facility-administered medications for this visit.    Physical Exam:     BP 102/72 (BP Location: Left Arm, Patient Position: Sitting, Cuff Size: Large)   Pulse 71   Ht 5\' 4"  (1.626 m)   Wt 207 lb 8 oz (94.1 kg)   LMP 11/26/2020 (Exact Date)   BMI 35.62 kg/m   GENERAL:  Pleasant, conversive, in NAD PSYCH: : Cooperative, normal affect EENT:  conjunctiva pink, mucous membranes moist, neck supple without masses CARDIAC:  RRR, no murmur heard, no peripheral edema PULM: Normal respiratory effort, lungs CTA bilaterally, no wheezing ABDOMEN:  Nondistended, soft, nontender. No obvious masses, no hepatomegaly,  normal bowel sounds SKIN:  turgor, no lesions seen Musculoskeletal:  Normal muscle tone, normal strength NEURO: Alert and oriented x 3, no focal neurologic deficits   IMPRESSION and PLAN:    1) GERD with erosive esophagitis 2) Hiatal hernia 3) Patulous LES  Paula Evans is a 35 y.o. female with a long-standing history of GERD and erosive esophagitis, incompletely responsive to PPI therapy, requesting hiatal hernia repair and antireflux surgery with goal of improved/resolved symptoms, resolution of esophagitis, and stopping acid suppression medications. Discussed the pathophysiology of GERD at length, to include the risks of untreated reflux (ie, strictures, Barrett's Esophagus, EAC, etc) as  well as the possible treatment with medications vs antireflux surgery. In particular, we discussed the risks, benefits, and alternatives of Transoral Incisionless Fundoplication (TIF), to include Nissen fundoplication, and the patient wishes to proceed  with cTIF.  -Plan for EGD to evaluate for resolution of erosive esophagitis and preoperative assessment -Esophageal Manometry for preoperative assessment -Plan for referral to Dr. Redmond Pulling at [facility] pending EM and EGD findings -Continue current PPI with H2 RA for breakthrough -Continue antireflux lifestyle/dietary modifications -Discussed TIF at length to include risks, benefits, alternatives - Reviewed postoperative dietary and activity restrictions with patient  The indications, risks, and benefits of EGD were explained to the patient in detail. Risks include but are not limited to bleeding, perforation, adverse reaction to medications, and cardiopulmonary compromise. Sequelae include but are not limited to the possibility of surgery, hospitalization, and mortality. The patient verbalized understanding and wished to proceed. All questions answered, referred to scheduler. Further recommendations pending results of the exam.   I spent 40 minutes of time, including in depth chart review, independent review of results as outlined above, communicating results with the patient directly, face-to-face time with the patient, coordinating care, ordering studies and medications as appropriate, and documentation.          [place] Proper ,DO, FACG 12/04/2020, 9:06 AM

## 2020-12-04 NOTE — Patient Instructions (Signed)
If you are age 35 or older, your body mass index should be between 23-30. Your Body mass index is 35.62 kg/m. If this is out of the afore mentioned range listed, please consider follow up with your Primary Care Provider.  If you are age 62 or younger, your body mass index should be between 19-25. Your Body mass index is 35.62 kg/m. If this is out of the aformentioned range listed, please consider follow up with your Primary Care Provider.   You have been scheduled for an endoscopy. Please follow written instructions given to you at your visit today. If you use inhalers (even only as needed), please bring them with you on the day of your procedure.  Your provider has requested that you have an Esophageal Manometry at [facility], you will be contacted regarding this appointment.   It was a pleasure to see you today!  Vito Cirigliano, D.O.

## 2020-12-05 ENCOUNTER — Telehealth: Payer: Self-pay | Admitting: Gastroenterology

## 2020-12-05 ENCOUNTER — Other Ambulatory Visit: Payer: Self-pay

## 2020-12-05 DIAGNOSIS — K449 Diaphragmatic hernia without obstruction or gangrene: Secondary | ICD-10-CM

## 2020-12-05 DIAGNOSIS — K2100 Gastro-esophageal reflux disease with esophagitis, without bleeding: Secondary | ICD-10-CM

## 2020-12-13 ENCOUNTER — Encounter: Payer: Self-pay | Admitting: Gastroenterology

## 2020-12-14 ENCOUNTER — Other Ambulatory Visit (HOSPITAL_COMMUNITY)
Admission: RE | Admit: 2020-12-14 | Discharge: 2020-12-14 | Disposition: A | Discharge status: Home / Self Care | Payer: BC Managed Care – PPO | Source: Ambulatory Visit | Attending: Gastroenterology | Admitting: Gastroenterology

## 2020-12-14 DIAGNOSIS — Z20822 Contact with and (suspected) exposure to covid-19: Secondary | ICD-10-CM | POA: Insufficient documentation

## 2020-12-14 DIAGNOSIS — Z01812 Encounter for preprocedural laboratory examination: Secondary | ICD-10-CM | POA: Insufficient documentation

## 2020-12-14 LAB — SARS CORONAVIRUS 2 (TAT 6-24 HRS): SARS Coronavirus 2: NEGATIVE

## 2020-12-18 ENCOUNTER — Encounter (HOSPITAL_COMMUNITY)
Admission: RE | Disposition: A | Discharge status: Home / Self Care | Payer: Self-pay | Source: Home / Self Care | Attending: Gastroenterology

## 2020-12-18 ENCOUNTER — Other Ambulatory Visit: Payer: Self-pay | Admitting: Gastroenterology

## 2020-12-18 ENCOUNTER — Ambulatory Visit (HOSPITAL_COMMUNITY)
Admission: RE | Admit: 2020-12-18 | Discharge: 2020-12-18 | Disposition: A | Discharge status: Home / Self Care | Payer: BC Managed Care – PPO | Attending: Gastroenterology | Admitting: Gastroenterology

## 2020-12-18 DIAGNOSIS — K2100 Gastro-esophageal reflux disease with esophagitis, without bleeding: Secondary | ICD-10-CM

## 2020-12-18 SURGERY — INVASIVE LAB ABORTED CASE

## 2020-12-18 MED ORDER — LIDOCAINE VISCOUS HCL 2 % MT SOLN
OROMUCOSAL | Status: AC
  Filled 2020-12-18: qty 15

## 2020-12-18 SURGICAL SUPPLY — 2 items
FACESHIELD LNG OPTICON STERILE (SAFETY) IMPLANT
GLOVE BIO SURGEON STRL SZ8 (GLOVE) ×6 IMPLANT

## 2020-12-18 NOTE — Progress Notes (Signed)
Attempted placement of esophageal Manometry probe. Attempted x2 patient got emotional. Stating she wasn't sure she could handle it. 2nd RN to bedside to help with emotional support during insertion. Was able to get probe halfway into esophagus but patient with anxiety and tears and unable to tolerate probe being in esophagus. Attempted to place all the way but patient wanting probe out and stated could not tolerate insertion.

## 2020-12-20 ENCOUNTER — Other Ambulatory Visit: Payer: Self-pay | Admitting: Gastroenterology

## 2020-12-20 DIAGNOSIS — K2100 Gastro-esophageal reflux disease with esophagitis, without bleeding: Secondary | ICD-10-CM

## 2020-12-20 DIAGNOSIS — K449 Diaphragmatic hernia without obstruction or gangrene: Secondary | ICD-10-CM

## 2020-12-27 ENCOUNTER — Encounter: Payer: BC Managed Care – PPO | Admitting: Gastroenterology

## 2020-12-29 ENCOUNTER — Ambulatory Visit (HOSPITAL_COMMUNITY)
Admission: RE | Admit: 2020-12-29 | Discharge: 2020-12-29 | Disposition: A | Discharge status: Home / Self Care | Payer: BC Managed Care – PPO | Source: Ambulatory Visit | Attending: Gastroenterology | Admitting: Gastroenterology

## 2020-12-29 ENCOUNTER — Other Ambulatory Visit: Payer: Self-pay

## 2020-12-29 DIAGNOSIS — K2100 Gastro-esophageal reflux disease with esophagitis, without bleeding: Secondary | ICD-10-CM

## 2020-12-29 DIAGNOSIS — K449 Diaphragmatic hernia without obstruction or gangrene: Secondary | ICD-10-CM | POA: Diagnosis not present

## 2020-12-29 DIAGNOSIS — K219 Gastro-esophageal reflux disease without esophagitis: Secondary | ICD-10-CM | POA: Diagnosis not present

## 2021-01-25 ENCOUNTER — Other Ambulatory Visit (HOSPITAL_COMMUNITY): Payer: Self-pay | Admitting: Psychiatry

## 2021-01-30 ENCOUNTER — Encounter (HOSPITAL_COMMUNITY): Payer: Self-pay | Admitting: Psychiatry

## 2021-01-30 ENCOUNTER — Telehealth (INDEPENDENT_AMBULATORY_CARE_PROVIDER_SITE_OTHER): Payer: BC Managed Care – PPO | Admitting: Psychiatry

## 2021-01-30 DIAGNOSIS — F411 Generalized anxiety disorder: Secondary | ICD-10-CM

## 2021-01-30 DIAGNOSIS — F332 Major depressive disorder, recurrent severe without psychotic features: Secondary | ICD-10-CM | POA: Diagnosis not present

## 2021-01-30 NOTE — Progress Notes (Signed)
[place] Follow up visit   Patient Identification: Paula Evans MRN:  [id number] Date of Evaluation:  01/30/2021 Referral Source: Therapist and primary care Chief Complaint:   Depression follow up     Visit Diagnosis:    ICD-10-CM   1. Severe episode of recurrent major depressive disorder, without psychotic features ([place])  F33.2   2. GAD (generalized anxiety disorder)  F41.1   Virtual Visit via Video Note  I connected with Paula Evans on 01/30/21 at  9:00 AM EDT by a video enabled telemedicine application and verified that I am speaking with the correct person using two identifiers.  Location: Patient: work Provider: office   I discussed the limitations of evaluation and management by telemedicine and the availability of in person appointments. The patient expressed understanding and agreed to proceed.    I discussed the assessment and treatment plan with the patient. The patient was provided an opportunity to ask questions and all were answered. The patient agreed with the plan and demonstrated an understanding of the instructions.   The patient was advised to call back or seek an in-person evaluation if the symptoms worsen or if the condition fails to improve as anticipated.  I provided 10  minutes of non-face-to-face time during this encounter, including chart review and documentation     History of Present Illness: Patient is a 35  years old single African-American female.  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  Multiple moves  Modifying factor: mom, sibling sister, current job less stressful  Duration since 2011.  Hospital admission for depression 2017         Past Psychiatric History: depression   Past Medical History:  Past Medical History:  Diagnosis Date  .  Abnormal Pap smear of cervix   . Anxiety   . Depression   . Dysmenorrhea   . Esophagitis   . GERD (gastroesophageal reflux disease)    just occasionally - diet controlled  . Hiatal hernia   . History of chicken pox   . Irritable bowel syndrome (IBS)   . Major depression   . Ovarian mass   . PONV (postoperative nausea and vomiting)     Past Surgical History:  Procedure Laterality Date  . BILATERAL SALPINGECTOMY N/A 04/20/2020   Procedure: EXPLORATORY LAPAROTOMY, EXCISION OF PELVIC MASS, MYOMECTOMY;  Surgeon: Lafonda Mosses, MD;  Location: [facility];  Service: Gynecology;  Laterality: N/A;  . CERVICAL BIOPSY  W/ LOOP ELECTRODE EXCISION    . COLPOSCOPY    . ROBOT ASSISTED MYOMECTOMY N/A 04/02/2017   Procedure: ROBOTIC ASSISTED MYOMECTOMY with Morcellation;  Surgeon: Delsa Bern, MD;  Location: [facility];  Service: Gynecology;  Laterality: N/A;  . WISDOM TOOTH EXTRACTION  2008    Family Psychiatric History: dad: depression, ptsd, alcohol use  Family History:  Family History  Problem Relation Age of Onset  . Hyperlipidemia Mother   . Alcohol abuse Father   . Post-traumatic stress disorder Father   . Drug abuse Father   . Lung cancer Paternal Uncle   . Diabetes Paternal Uncle   . Breast cancer Maternal Grandmother   . Colon polyps Paternal Grandmother   . Ovarian cancer Maternal Aunt   . Colon cancer Neg Hx   . Esophageal cancer Neg Hx   . Rectal cancer Neg Hx   .  Stomach cancer Neg Hx     Social History:   Social History   Socioeconomic History  . Marital status: Single    Spouse name: Not on file  . Number of children: 0  . Years of education: Not on file  . Highest education level: Not on file  Occupational History  . Occupation: dining services/chef    Employer: [employer] Use  . Smoking status: Former Smoker    Packs/day: 0.25    Years: 4.00    Pack years: 1.00    Types: Cigarettes    Quit date: 10/28/2013    Years since quitting: 7.2  . Smokeless  tobacco: Never Used  Vaping Use  . Vaping Use: Never used  Substance and Sexual Activity  . Alcohol use: Yes    Alcohol/week: 3.0 - 4.0 standard drinks    Types: 1 - 2 Cans of beer, 2 Standard drinks or equivalent per week    Comment: occasional  . Drug use: No  . Sexual activity: Not Currently    Partners: Female    Birth control/protection: None  Other Topics Concern  . Not on file  Social History Narrative   Diet: junk food, water, some fruits and veggies, minimum calcium.   Social Determinants of Health   Financial Resource Strain: Not on file  Food Insecurity: Not on file  Transportation Needs: Not on file  Physical Activity: Not on file  Stress: Not on file  Social Connections: Not on file    Allergies:   Allergies  Allergen Reactions  . Other Dermatitis    dermabond    Metabolic Disorder Labs: No results found for: HGBA1C, MPG No results found for: PROLACTIN Lab Results  Component Value Date   CHOL 154 05/17/2019   TRIG 101.0 05/17/2019   HDL 52.00 05/17/2019   CHOLHDL 3 05/17/2019   VLDL 20.2 05/17/2019   LDLCALC 81 05/17/2019   LDLCALC 99 01/16/2018   No results found for: TSH  Therapeutic Level Labs: No results found for: LITHIUM No results found for: CBMZ No results found for: VALPROATE  Current Medications: Current Outpatient Medications  Medication Sig Dispense Refill  . Cabergoline (DOSTINEX PO) Take by mouth. 1/2 tablet twice a week    . escitalopram (LEXAPRO) 10 MG tablet TAKE 1 TABLET BY MOUTH EVERY DAY 90 tablet 0  . pantoprazole (PROTONIX) 40 MG tablet TAKE 1 TABLET (40 MG TOTAL) BY MOUTH DAILY 90 tablet 0   No current facility-administered medications for this visit.     Psychiatric Specialty Exam: Review of Systems  Cardiovascular: Negative for chest pain.  Psychiatric/Behavioral: Negative for substance abuse and suicidal ideas.    There were no vitals taken for this visit.There is no height or weight on file to calculate BMI.   General Appearance: well groomed  Eye Contact: good  Speech:  Slow  Volume:  Normal  Mood: fair  Affect:  Congruent  Thought Process:  Goal Directed  Orientation:  Full (Time, Place, and Person)  Thought Content:  Logical  Suicidal Thoughts:  No  Homicidal Thoughts:  No  Memory:  Immediate;   Fair Recent;   Fair  Judgement:  Fair  Insight:  Fair  Psychomotor Activity:  Normal  Concentration:  Concentration: Fair and Attention Span: Fair  Recall:  [employer] of Knowledge:Good  Language: Good  Akathisia:  No  Handed:  Right  AIMS (if indicated):  not done  Assets:  Desire for Improvement Physical Health  ADL's:  Intact  Cognition: WNL  Sleep:  Fair   Screenings: GAD-7   Personnel officer Visit from 06/17/2019 in [place] at [facility]  Total GAD-7 Score 10    PHQ2-9   [place] Office Visit from 06/17/2019 in [place] at [facility] [place] Visit from 01/20/2018 in [place] at [place] from 01/16/2017 in [place] Visit from 05/31/2016 in [place] at [facility]  PHQ-2 Total Score 1 0 3 1  PHQ-9 Total Score 4 -- -- 10    Flowsheet Row Video Visit from 01/30/2021 in [place] No Risk      Assessment and Plan: as follows MDD moderate: recurrent : doing fair, continue lexapro MDY:[id number] it fair, continue lexapro  Fu 19m.   Merian Capron, MD 4/5/20229:08 AM

## 2021-02-08 ENCOUNTER — Ambulatory Visit: Payer: Self-pay | Admitting: General Surgery

## 2021-02-08 DIAGNOSIS — K219 Gastro-esophageal reflux disease without esophagitis: Secondary | ICD-10-CM | POA: Diagnosis not present

## 2021-02-08 DIAGNOSIS — K449 Diaphragmatic hernia without obstruction or gangrene: Secondary | ICD-10-CM | POA: Diagnosis not present

## 2021-02-08 NOTE — H&P (Signed)
Tami Lin Appointment: 02/08/2021 1:30 PM Location: [facility] Patient #: [id number] DOB: [birth year] Single / Language: [place] / Race: Undefined Female  History of Present Illness Minerva Areola M. Mahum Betten MD; 02/08/2021 1:58 PM) The patient is a 35 year old female who presents with gastroesophageal reflux disease. She is referred by Dr Barron Alvine for evaluation of concomitant hiatal hernia with TIF procedure by GI. She reports at least a 15 year history of GERD. She started taking over-the-counter heartburn medications at around age 72. She has been on prescription heartburn medication for at least the past 2 years. She describes that her symptoms are generally well controlled with her prescription medications. She takes Protonix once a day and occasional famotidine at night if she is having breakthrough. She may have breakthrough episodes once a week. She describes her heartburn with his uncontrolled as a fire in her chest she will also have a sensation of bile coming up her esophagus into her throat. She does not actually vomit food or bile. She denies any trouble with liquids or solids and swallowing. She will occasionally have to sleep incline but can then generally lay flat after passes. Some occasional early satiety. Daily bowel movement. Former smoker. She has had a robotic myomectomy. Ago. She had abdominal ascites and a large stomach deflated uterine mass that required exploratory laparotomy and resection last summer. I reviewed the operative note. It was a benign lesion. I discussed her case with her gyn/onc prior to her visit today. She feels that she does not issue need any additional imaging since she had curative surgery and it was a benign lesion. The patient denies any weight change. She denies any lightheadedness or dizziness. She denies any shortness of breath, dyspnea on exertion  GERD history: -Index symptoms: Heartburn, regurgitation, nausea. Nocturnal  regurgitation, choking. No dysphagia. -Exacerbating features: Forward flexion, chocolate, red sauces, spicy -Medications trialed: Prilosec, Nexium, Prevacid, Zantac, Tums -Current medications: Protonix 40 mg/day, famotidine 20 mg prn breakthrough (takes 1-2 nights/week) -Complications: Erosive esophagitis, hiatal hernia  GERD evaluation: -Last EGD: 01/2020 -Barium esophagram: None -Esophageal Manometry: aborted -pH/Impedance: N/A -Bravo: N/A  Endoscopic History: -EGD (01/2020): 3 cm HH, LA Grade B esophagitis, Hill grade 3-4 valve. Normal stomach, duodenum   GERD-HRQL Questionnaire Score: 29/50, marked "dissatisfied" with current health condition  Maternal grandfather with stomach cancer. Paternal grandmother with colon resection for unknown reason.   Problem List/Past Medical Minerva Areola M. Andrey Campanile, MD; 02/08/2021 2:02 PM) HIATAL HERNIA WITH GERD (K44.9, K21.9)  Past Surgical History Marliss Coots, CNA; 02/08/2021 1:26 PM) Oral Surgery  Diagnostic Studies History Marliss Coots, CNA; 02/08/2021 1:26 PM) Colonoscopy never Mammogram within last year Pap Smear 1-5 years ago  Allergies Marliss Coots, CNA; 02/08/2021 1:27 PM) No Known Drug Allergies [02/08/2021]: Allergies Reconciled  Medication History Marliss Coots, CNA; 02/08/2021 1:27 PM) HYDROmorphone HCl (2MG  Tablet, Oral) Active. Betamethasone Dipropionate (0.05% Cream, External) Active. Cabergoline (0.5MG  Tablet, Oral) Active. Cephalexin (500MG  Capsule, Oral) Active. [employer] Gas Relief (80MG  Tablet Chewable, Oral) Active. Escitalopram Oxalate (10MG  Tablet, Oral) Active. hydrOXYzine HCl (10MG  Tablet, Oral) Active. Naproxen Sodium (550MG  Tablet, Oral) Active. Nikki (3-0.02MG  Tablet, Oral) Active. Ondansetron HCl (4MG  Tablet, Oral) Active. Pantoprazole Sodium (40MG  Tablet DR, Oral) Active. Medications Reconciled  Social History Marliss Coots, CNA; 02/08/2021 1:26 PM) Alcohol use Occasional alcohol  use. Caffeine use Coffee. No drug use Tobacco use Former smoker.  Family History Marliss Coots, CNA; 02/08/2021 1:26 PM) Alcohol Abuse Father. Thyroid problems Sister.  Pregnancy / Birth History Marliss Coots, CNA; 02/08/2021 1:26  PM) Age at menarche 12 years. Contraceptive History Oral contraceptives. Gravida 0 Para 0 Regular periods  Other Problems Minerva Areola M. Andrey Campanile, MD; 02/08/2021 2:02 PM) Anxiety Disorder Depression Gastroesophageal Reflux Disease     Review of Systems Minerva Areola M. Jaxsyn Catalfamo MD; 02/08/2021 2:02 PM) General Not Present- Appetite Loss, Chills, Fatigue, Fever, Night Sweats, Weight Gain and Weight Loss. Skin Not Present- Change in Wart/Mole, Dryness, Hives, Jaundice, New Lesions, Non-Healing Wounds, Rash and Ulcer. HEENT Present- Hoarseness, Sinus Pain and Wears glasses/contact lenses. Not Present- Earache, Hearing Loss, Nose Bleed, Oral Ulcers, Ringing in the Ears, Seasonal Allergies, Sore Throat, Visual Disturbances and Yellow Eyes. Respiratory Not Present- Bloody sputum, Chronic Cough, Difficulty Breathing, Snoring and Wheezing. Breast Not Present- Breast Mass, Breast Pain, Nipple Discharge and Skin Changes. Cardiovascular Present- Chest Pain and Palpitations. Not Present- Difficulty Breathing Lying Down, Leg Cramps, Rapid Heart Rate, Shortness of Breath and Swelling of Extremities. Gastrointestinal Present- Excessive gas. Not Present- Abdominal Pain, Bloating, Bloody Stool, Change in Bowel Habits, Chronic diarrhea, Constipation, Difficulty Swallowing, Gets full quickly at meals, Hemorrhoids, Indigestion, Nausea, Rectal Pain and Vomiting. Female Genitourinary Not Present- Frequency, Nocturia, Painful Urination, Pelvic Pain and Urgency. Musculoskeletal Present- Back Pain. Not Present- Joint Pain, Joint Stiffness, Muscle Pain, Muscle Weakness and Swelling of Extremities. Neurological Not Present- Decreased Memory, Fainting, Headaches, Numbness, Seizures,  Tingling, Tremor, Trouble walking and Weakness. Psychiatric Present- Anxiety. Not Present- Bipolar, Change in Sleep Pattern, Depression, Fearful and Frequent crying. Endocrine Not Present- Cold Intolerance, Excessive Hunger, Hair Changes, Heat Intolerance, Hot flashes and New Diabetes. Hematology Not Present- Blood Thinners, Easy Bruising, Excessive bleeding, Gland problems, HIV and Persistent Infections. All other systems negative  Vitals Daiva Nakayama Alston CNA; 02/08/2021 1:28 PM) 02/08/2021 1:27 PM Weight: 209.38 lb Height: 64in Body Surface Area: 1.99 m Body Mass Index: 35.94 kg/m  Temp.: 98.74F  Pulse: 88 (Regular)  P.OX: 99% (Room air) BP: 100/70(Sitting, Left Arm, Standard)        Physical Exam Minerva Areola M. Keymani Glynn MD; 02/08/2021 1:55 PM)  General Mental Status-Alert. General Appearance-Consistent with stated age. Hydration-Well hydrated. Voice-Normal.  Head and Neck Head-normocephalic, atraumatic with no lesions or palpable masses. Trachea-midline. Thyroid Gland Characteristics - normal size and consistency.  Eye Eyeball - Bilateral-Normal. Sclera/Conjunctiva - Bilateral-No scleral icterus.  Chest and Lung Exam Chest and lung exam reveals -quiet, even and easy respiratory effort with no use of accessory muscles and on auscultation, normal breath sounds, no adventitious sounds and normal vocal resonance. Inspection Chest Wall - Normal. Back - normal.  Breast - Did not examine.  Cardiovascular Cardiovascular examination reveals -normal heart sounds, regular rate and rhythm with no murmurs and normal pedal pulses bilaterally.  Abdomen Inspection Inspection of the abdomen reveals - No Hernias. Skin - Scar - Note: well healed trocar scars; lower midline scar. Palpation/Percussion Palpation and Percussion of the abdomen reveal - Soft, Non Tender, No Rebound tenderness, No Rigidity (guarding) and No  hepatosplenomegaly. Auscultation Auscultation of the abdomen reveals - Bowel sounds normal.  Peripheral Vascular Upper Extremity Palpation - Pulses bilaterally normal.  Neurologic Neurologic evaluation reveals -alert and oriented x 3 with no impairment of recent or remote memory. Mental Status-Normal.  Neuropsychiatric The patient's mood and affect are described as -normal. Judgment and Insight-insight is appropriate concerning matters relevant to self.  Musculoskeletal Normal Exam - Left-Upper Extremity Strength Normal and Lower Extremity Strength Normal. Normal Exam - Right-Upper Extremity Strength Normal and Lower Extremity Strength Normal.  Lymphatic Head & Neck  General Head & Neck Lymphatics: Bilateral -  Description - Normal. Axillary - Did not examine. Femoral & Inguinal - Did not examine.    Assessment & Plan Minerva Areola M. Keauna Brasel MD; 02/08/2021 2:01 PM)  HIATAL HERNIA WITH GERD (K44.9) Impression: She has long-standing GERD with a small hiatal hernia. Even though her upper GI did not officially see sliding hiatal hernia upon my review of think I can detect it. On endoscopic pictures or simply evidence of a hiatal hernia. She was unable to tolerate manometry so she underwent that upper GI. There is no radiological evidence of achalasia. I think we can proceed with laparoscopic hiatal hernia repair with concomitant TIF procedure. I think she would have good result since her symptoms are well controlled on PPI therapy. I discussed the anatomy of the foregut. Patient was given educational booklet. I discussed the steps of a laparoscopic hiatal hernia repair. I discussed reduction of the stomach, mobilization of some of the esophagus and closure of the diaphragm. We discussed the hospital course. We discussed the typical recovery. We discussed the diet transitioned. We discussed the typical issues that we can see after this surgery as well as the trouble with some of the  dietary progression. We discussed the possibility of continuing to need heartburn medication. We then discussed risks inherent to the hiatal hernia repair such as bleeding, infection, injury to surrounding structures, perioperative cardiac and pulmonary events, blood clot formation, hiatal hernia recurrence. We discussed the difference between radiological recurrence versus symptomatic recurrence. We discussed out of work. She does have an allergy to Dermabond. All of her questions were asked and answered. She would like to proceed with the combined procedure.  This patient encounter took 45 minutes today to perform the following: take history, perform exam, review outside records, interpret imaging, counsel the patient on their diagnosis and document encounter, findings & plan in the EHR  Current Plans Pt Education - Pamphlet Given - Gastroesophagel Reflux Disease: discussed with patient and provided information. You are being scheduled for surgery- Our schedulers will call you.  You should hear from our office's scheduling department within 5 working days about the location, date, and time of surgery. We try to make accommodations for patient's preferences in scheduling surgery, but sometimes the OR schedule or the surgeon's schedule prevents [place] from making those accommodations.  If you have not heard from our office [phone]) in 5 working days, call the office and ask for your surgeon's nurse.  If you have other questions about your diagnosis, plan, or surgery, call the office and ask for your surgeon's nurse.  Mary Sella. Andrey Campanile, MD, FACS General, Bariatric, & Minimally Invasive Surgery [facility], [place]

## 2021-02-13 ENCOUNTER — Telehealth: Payer: Self-pay

## 2021-02-13 NOTE — Telephone Encounter (Signed)
Spoke with Caryl Pina at [facility] to discuss possible dates for cTIF. Caryl Pina does not have July schedule at this time but will check some dates with Dr. Redmond Pulling and will get back with me.

## 2021-02-16 ENCOUNTER — Other Ambulatory Visit (HOSPITAL_COMMUNITY): Payer: BC Managed Care – PPO

## 2021-02-19 ENCOUNTER — Encounter (HOSPITAL_COMMUNITY): Payer: Self-pay

## 2021-02-19 ENCOUNTER — Ambulatory Visit (HOSPITAL_COMMUNITY): Admit: 2021-02-19 | Payer: BC Managed Care – PPO | Admitting: Gastroenterology

## 2021-02-19 SURGERY — MANOMETRY, ESOPHAGUS
Anesthesia: Monitor Anesthesia Care

## 2021-02-19 SURGERY — ESOPHAGOGASTRODUODENOSCOPY (EGD) WITH PROPOFOL
Anesthesia: Monitor Anesthesia Care

## 2021-02-23 DIAGNOSIS — D259 Leiomyoma of uterus, unspecified: Secondary | ICD-10-CM | POA: Diagnosis not present

## 2021-03-02 ENCOUNTER — Other Ambulatory Visit: Payer: Self-pay | Admitting: Internal Medicine

## 2021-04-23 ENCOUNTER — Encounter (HOSPITAL_COMMUNITY): Payer: Self-pay | Admitting: Psychiatry

## 2021-04-23 ENCOUNTER — Telehealth (INDEPENDENT_AMBULATORY_CARE_PROVIDER_SITE_OTHER): Payer: BC Managed Care – PPO | Admitting: Psychiatry

## 2021-04-23 DIAGNOSIS — F411 Generalized anxiety disorder: Secondary | ICD-10-CM

## 2021-04-23 DIAGNOSIS — F332 Major depressive disorder, recurrent severe without psychotic features: Secondary | ICD-10-CM | POA: Diagnosis not present

## 2021-04-23 MED ORDER — ESCITALOPRAM OXALATE 10 MG PO TABS: 10.0000 mg | ORAL_TABLET | Freq: Every day | ORAL | 0 refills | Status: AC

## 2021-04-23 NOTE — Progress Notes (Signed)
[facility] [place] Follow up visit   Patient Identification: Paula Evans MRN:  [id number] Date of Evaluation:  04/23/2021 Referral Source: Therapist and primary care Chief Complaint:   Depression follow up     Visit Diagnosis:    ICD-10-CM   1. Severe episode of recurrent major depressive disorder, without psychotic features ([place])  F33.2     2. GAD (generalized anxiety disorder)  F41.1      Virtual Visit via Video Note  I connected with [name] on 04/23/21 at 10:30 AM EDT by a video enabled telemedicine application and verified that I am speaking with the correct person using two identifiers.  Location: Patient: home Provider: home office   I discussed the limitations of evaluation and management by telemedicine and the availability of in person appointments. The patient expressed understanding and agreed to proceed.    I discussed the assessment and treatment plan with the patient. The patient was provided an opportunity to ask questions and all were answered. The patient agreed with the plan and demonstrated an understanding of the instructions.   The patient was advised to call back or seek an in-person evaluation if the symptoms worsen or if the condition fails to improve as anticipated.  I provided 10  minutes of non-face-to-face time during this encounter including documentation      History of Present Illness: Patient is a 35  years old single African-American female.   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  Multiple moves  Modifying factor: Mom, sister Duration since 2011.  Hospital admission for depression 2017         Past Psychiatric History:  depression   Past Medical History:  Past Medical History:  Diagnosis Date   Abnormal Pap smear of cervix    Anxiety    Depression    Dysmenorrhea    Esophagitis    GERD (gastroesophageal reflux disease)    just occasionally - diet controlled   Hiatal hernia    History of chicken pox    Irritable bowel syndrome (IBS)    Major depression    Ovarian mass    PONV (postoperative nausea and vomiting)     Past Surgical History:  Procedure Laterality Date   BILATERAL SALPINGECTOMY N/A 04/20/2020   Procedure: EXPLORATORY LAPAROTOMY, EXCISION OF PELVIC MASS, MYOMECTOMY;  Surgeon: Lafonda Mosses, MD;  Location: [facility];  Service: Gynecology;  Laterality: N/A;   CERVICAL BIOPSY  W/ LOOP ELECTRODE EXCISION     COLPOSCOPY     ROBOT ASSISTED MYOMECTOMY N/A 04/02/2017   Procedure: ROBOTIC ASSISTED MYOMECTOMY with Morcellation;  Surgeon: Delsa Bern, MD;  Location: [facility];  Service: Gynecology;  Laterality: N/A;   WISDOM TOOTH EXTRACTION  2008    Family Psychiatric History: dad: depression, ptsd, alcohol use  Family History:  Family History  Problem Relation Age of Onset   Hyperlipidemia Mother    Alcohol abuse Father    Post-traumatic stress disorder Father    Drug abuse Father    Lung cancer Paternal Uncle    Diabetes Paternal Uncle    Breast cancer Maternal Grandmother    Colon polyps Paternal Grandmother    Ovarian cancer Maternal Aunt    Colon cancer  Neg Hx    Esophageal cancer Neg Hx    Rectal cancer Neg Hx    Stomach cancer Neg Hx     Social History:   Social History   Socioeconomic History   Marital status: Single    Spouse name: Not on file   Number of children: 0   Years of education: Not on file   Highest education level: Not on file  Occupational History   Occupation: dining services/chef    Employer: [employer]  Tobacco Use   Smoking status: Former    Packs/day: 0.25    Years: 4.00    Pack years: 1.00    Types: Cigarettes    Quit date: 10/28/2013    Years  since quitting: 7.4   Smokeless tobacco: Never  Vaping Use   Vaping Use: Never used  Substance and Sexual Activity   Alcohol use: Yes    Alcohol/week: 3.0 - 4.0 standard drinks    Types: 1 - 2 Cans of beer, 2 Standard drinks or equivalent per week    Comment: occasional   Drug use: No   Sexual activity: Not Currently    Partners: Female    Birth control/protection: None  Other Topics Concern   Not on file  Social History Narrative   Diet: junk food, water, some fruits and veggies, minimum calcium.   Social Determinants of Health   Financial Resource Strain: Not on file  Food Insecurity: Not on file  Transportation Needs: Not on file  Physical Activity: Not on file  Stress: Not on file  Social Connections: Not on file    Allergies:   Allergies  Allergen Reactions   Other Dermatitis    dermabond    Metabolic Disorder Labs: No results found for: HGBA1C, MPG No results found for: PROLACTIN Lab Results  Component Value Date   CHOL 154 05/17/2019   TRIG 101.0 05/17/2019   HDL 52.00 05/17/2019   CHOLHDL 3 05/17/2019   VLDL 20.2 05/17/2019   LDLCALC 81 05/17/2019   LDLCALC 99 01/16/2018   No results found for: TSH  Therapeutic Level Labs: No results found for: LITHIUM No results found for: CBMZ No results found for: VALPROATE  Current Medications: Current Outpatient Medications  Medication Sig Dispense Refill   Cabergoline (DOSTINEX PO) Take by mouth. 1/2 tablet twice a week     escitalopram (LEXAPRO) 10 MG tablet Take 1 tablet (10 mg total) by mouth daily. 90 tablet 0   pantoprazole (PROTONIX) 40 MG tablet TAKE 1 TABLET BY MOUTH EVERY DAY 90 tablet 0   No current facility-administered medications for this visit.     Psychiatric Specialty Exam: Review of Systems  Cardiovascular:  Negative for chest pain.  Psychiatric/Behavioral:  Negative for substance abuse and suicidal ideas.    There were no vitals taken for this visit.There is no height or weight on  file to calculate BMI.  General Appearance: well groomed  Eye Contact: good  Speech:  Slow  Volume:  Normal  Mood: fair  Affect:  Congruent  Thought Process:  Goal Directed  Orientation:  Full (Time, Place, and Person)  Thought Content:  Logical  Suicidal Thoughts:  No  Homicidal Thoughts:  No  Memory:  Immediate;   Fair Recent;   Fair  Judgement:  Fair  Insight:  Fair  Psychomotor Activity:  Normal  Concentration:  Concentration: Fair and Attention Span: Fair  Recall:  [employer] of Knowledge:Good  Language: Good  Akathisia:  No  Handed:  Right  AIMS (if indicated):  not done  Assets:  Desire for Improvement Physical Health  ADL's:  Intact  Cognition: WNL  Sleep:  Fair   Screenings: GAD-7    Flowsheet Row Office Visit from 06/17/2019 in [place] at [facility]  Total GAD-7 Score 10      PHQ2-9    [place] Office Visit from 06/17/2019 in [place] at [place] from 01/20/2018 in [place] at [facility] from 01/16/2017 in [facility] from 05/31/2016 in [place] at [facility]  PHQ-2 Total Score 1 0 3 1  PHQ-9 Total Score 4 -- -- 10      Flowsheet Row Video Visit from 04/23/2021 in [facility] Video Visit from 01/30/2021 in [facility] No Risk No Risk       Assessment and Plan: as follows MDD moderate: recurrent : Doing reasonable continue Lexapro  GAD: Doing fair continue Lexapro   She is moving to [place] can be discharged home with her son and 90-day supply    Merian Capron, MD 6/27/202210:39 AM

## 2021-04-24 ENCOUNTER — Other Ambulatory Visit: Payer: Self-pay | Admitting: Internal Medicine

## 2021-05-16 ENCOUNTER — Telehealth (HOSPITAL_COMMUNITY): Payer: BC Managed Care – PPO | Admitting: Psychiatry

## 2021-05-25 ENCOUNTER — Telehealth: Payer: Self-pay | Admitting: Gastroenterology

## 2021-05-25 NOTE — Telephone Encounter (Signed)
Caryl Pina from [facility] is wanting to coordinate a procedure for TIF with Dr. Redmond Pulling please call her back at [phone].

## 2021-05-30 ENCOUNTER — Other Ambulatory Visit: Payer: Self-pay

## 2021-05-30 DIAGNOSIS — K449 Diaphragmatic hernia without obstruction or gangrene: Secondary | ICD-10-CM

## 2021-05-30 DIAGNOSIS — K2100 Gastro-esophageal reflux disease with esophagitis, without bleeding: Secondary | ICD-10-CM

## 2021-05-30 NOTE — Telephone Encounter (Signed)
Spoke with Caryl Pina at [facility]. We have scheduled patient for a cTIF on Thursday, 06/28/21. Dr. Redmond Pulling will start at 3 PM and Dr. Bryan Lemma will follow at 4 PM.   Patient has been scheduled for a 2-week post TIF follow up with Dr. Bryan Lemma on Thursday, 07/12/21 at 1:40 PM.  My Chart message has been sent to patient with post TIF diet and a copy will be mailed as well.   Lm on vm for patient to return call to discuss.

## 2021-05-31 NOTE — Telephone Encounter (Signed)
Spoke with patient to review information below. She states that she has moved to a different state and is in the process of having all of her care transferred to [place]. Patient would like to cancel cTIF procedure.   Lm on vm for [place] at [facility].

## 2021-06-01 DIAGNOSIS — Z114 Encounter for screening for human immunodeficiency virus [HIV]: Secondary | ICD-10-CM | POA: Diagnosis not present

## 2021-06-01 DIAGNOSIS — K219 Gastro-esophageal reflux disease without esophagitis: Secondary | ICD-10-CM | POA: Diagnosis not present

## 2021-06-01 DIAGNOSIS — F3342 Major depressive disorder, recurrent, in full remission: Secondary | ICD-10-CM | POA: Diagnosis not present

## 2021-06-01 DIAGNOSIS — Z0000 Encounter for general adult medical examination without abnormal findings: Secondary | ICD-10-CM | POA: Diagnosis not present

## 2021-06-01 DIAGNOSIS — K449 Diaphragmatic hernia without obstruction or gangrene: Secondary | ICD-10-CM | POA: Diagnosis not present

## 2021-06-01 DIAGNOSIS — Z6833 Body mass index (BMI) 33.0-33.9, adult: Secondary | ICD-10-CM | POA: Diagnosis not present

## 2021-06-05 DIAGNOSIS — R5383 Other fatigue: Secondary | ICD-10-CM | POA: Diagnosis not present

## 2021-06-05 DIAGNOSIS — Z114 Encounter for screening for human immunodeficiency virus [HIV]: Secondary | ICD-10-CM | POA: Diagnosis not present

## 2021-06-05 DIAGNOSIS — Z0000 Encounter for general adult medical examination without abnormal findings: Secondary | ICD-10-CM | POA: Diagnosis not present

## 2021-06-05 DIAGNOSIS — R631 Polydipsia: Secondary | ICD-10-CM | POA: Diagnosis not present

## 2021-06-28 ENCOUNTER — Ambulatory Visit: Admit: 2021-06-28 | Payer: BC Managed Care – PPO | Admitting: General Surgery

## 2021-06-28 SURGERY — REPAIR, HERNIA, HIATAL, LAPAROSCOPIC
Anesthesia: General

## 2021-07-03 DIAGNOSIS — K449 Diaphragmatic hernia without obstruction or gangrene: Secondary | ICD-10-CM | POA: Diagnosis not present

## 2021-07-03 DIAGNOSIS — K219 Gastro-esophageal reflux disease without esophagitis: Secondary | ICD-10-CM | POA: Diagnosis not present

## 2021-07-10 DIAGNOSIS — Z01419 Encounter for gynecological examination (general) (routine) without abnormal findings: Secondary | ICD-10-CM | POA: Diagnosis not present

## 2021-07-10 DIAGNOSIS — N760 Acute vaginitis: Secondary | ICD-10-CM | POA: Diagnosis not present

## 2021-07-10 DIAGNOSIS — Z1151 Encounter for screening for human papillomavirus (HPV): Secondary | ICD-10-CM | POA: Diagnosis not present

## 2021-07-10 DIAGNOSIS — Z113 Encounter for screening for infections with a predominantly sexual mode of transmission: Secondary | ICD-10-CM | POA: Diagnosis not present

## 2021-07-12 ENCOUNTER — Ambulatory Visit: Payer: BC Managed Care – PPO | Admitting: Gastroenterology

## 2021-07-14 DIAGNOSIS — Z113 Encounter for screening for infections with a predominantly sexual mode of transmission: Secondary | ICD-10-CM | POA: Diagnosis not present

## 2021-07-14 DIAGNOSIS — D259 Leiomyoma of uterus, unspecified: Secondary | ICD-10-CM | POA: Diagnosis not present

## 2021-07-14 DIAGNOSIS — R102 Pelvic and perineal pain: Secondary | ICD-10-CM | POA: Diagnosis not present

## 2021-07-16 DIAGNOSIS — K449 Diaphragmatic hernia without obstruction or gangrene: Secondary | ICD-10-CM | POA: Diagnosis not present

## 2021-07-16 DIAGNOSIS — K219 Gastro-esophageal reflux disease without esophagitis: Secondary | ICD-10-CM | POA: Diagnosis not present

## 2021-08-03 DIAGNOSIS — K219 Gastro-esophageal reflux disease without esophagitis: Secondary | ICD-10-CM | POA: Diagnosis not present

## 2021-08-03 DIAGNOSIS — K449 Diaphragmatic hernia without obstruction or gangrene: Secondary | ICD-10-CM | POA: Diagnosis not present

## 2021-08-08 DIAGNOSIS — J9811 Atelectasis: Secondary | ICD-10-CM | POA: Diagnosis not present

## 2021-08-08 DIAGNOSIS — K449 Diaphragmatic hernia without obstruction or gangrene: Secondary | ICD-10-CM | POA: Diagnosis not present

## 2021-08-08 DIAGNOSIS — K219 Gastro-esophageal reflux disease without esophagitis: Secondary | ICD-10-CM | POA: Diagnosis not present

## 2021-08-08 DIAGNOSIS — E669 Obesity, unspecified: Secondary | ICD-10-CM | POA: Diagnosis not present

## 2021-08-08 DIAGNOSIS — Z6834 Body mass index (BMI) 34.0-34.9, adult: Secondary | ICD-10-CM | POA: Diagnosis not present

## 2021-08-08 DIAGNOSIS — F418 Other specified anxiety disorders: Secondary | ICD-10-CM | POA: Diagnosis not present

## 2021-08-08 DIAGNOSIS — R918 Other nonspecific abnormal finding of lung field: Secondary | ICD-10-CM | POA: Diagnosis not present

## 2021-08-08 DIAGNOSIS — J984 Other disorders of lung: Secondary | ICD-10-CM | POA: Diagnosis not present

## 2021-08-13 DIAGNOSIS — N839 Noninflammatory disorder of ovary, fallopian tube and broad ligament, unspecified: Secondary | ICD-10-CM | POA: Diagnosis not present

## 2021-08-13 DIAGNOSIS — D252 Subserosal leiomyoma of uterus: Secondary | ICD-10-CM | POA: Diagnosis not present

## 2021-08-21 DIAGNOSIS — N644 Mastodynia: Secondary | ICD-10-CM | POA: Diagnosis not present

## 2021-08-21 DIAGNOSIS — N6452 Nipple discharge: Secondary | ICD-10-CM | POA: Diagnosis not present

## 2021-08-23 DIAGNOSIS — K449 Diaphragmatic hernia without obstruction or gangrene: Secondary | ICD-10-CM | POA: Diagnosis not present

## 2021-09-03 DIAGNOSIS — N644 Mastodynia: Secondary | ICD-10-CM | POA: Diagnosis not present

## 2021-09-03 DIAGNOSIS — N6452 Nipple discharge: Secondary | ICD-10-CM | POA: Diagnosis not present

## 2021-12-13 DIAGNOSIS — F419 Anxiety disorder, unspecified: Secondary | ICD-10-CM | POA: Diagnosis not present

## 2021-12-13 DIAGNOSIS — F3289 Other specified depressive episodes: Secondary | ICD-10-CM | POA: Diagnosis not present

## 2022-01-22 DIAGNOSIS — F331 Major depressive disorder, recurrent, moderate: Secondary | ICD-10-CM | POA: Diagnosis not present

## 2022-02-05 DIAGNOSIS — F331 Major depressive disorder, recurrent, moderate: Secondary | ICD-10-CM | POA: Diagnosis not present

## 2022-02-11 DIAGNOSIS — F324 Major depressive disorder, single episode, in partial remission: Secondary | ICD-10-CM | POA: Diagnosis not present

## 2022-02-11 DIAGNOSIS — F411 Generalized anxiety disorder: Secondary | ICD-10-CM | POA: Diagnosis not present

## 2022-02-19 DIAGNOSIS — F331 Major depressive disorder, recurrent, moderate: Secondary | ICD-10-CM | POA: Diagnosis not present

## 2022-03-06 DIAGNOSIS — F331 Major depressive disorder, recurrent, moderate: Secondary | ICD-10-CM | POA: Diagnosis not present

## 2022-03-21 DIAGNOSIS — F331 Major depressive disorder, recurrent, moderate: Secondary | ICD-10-CM | POA: Diagnosis not present

## 2022-04-04 DIAGNOSIS — F331 Major depressive disorder, recurrent, moderate: Secondary | ICD-10-CM | POA: Diagnosis not present

## 2022-04-08 DIAGNOSIS — F1010 Alcohol abuse, uncomplicated: Secondary | ICD-10-CM | POA: Diagnosis not present

## 2022-04-08 DIAGNOSIS — F325 Major depressive disorder, single episode, in full remission: Secondary | ICD-10-CM | POA: Diagnosis not present

## 2022-04-08 DIAGNOSIS — F411 Generalized anxiety disorder: Secondary | ICD-10-CM | POA: Diagnosis not present

## 2022-04-18 DIAGNOSIS — F331 Major depressive disorder, recurrent, moderate: Secondary | ICD-10-CM | POA: Diagnosis not present

## 2022-04-23 DIAGNOSIS — D259 Leiomyoma of uterus, unspecified: Secondary | ICD-10-CM | POA: Diagnosis not present

## 2022-04-23 DIAGNOSIS — K1379 Other lesions of oral mucosa: Secondary | ICD-10-CM | POA: Diagnosis not present

## 2022-04-23 DIAGNOSIS — R102 Pelvic and perineal pain: Secondary | ICD-10-CM | POA: Diagnosis not present

## 2022-04-25 DIAGNOSIS — R234 Changes in skin texture: Secondary | ICD-10-CM | POA: Diagnosis not present

## 2022-04-25 DIAGNOSIS — L308 Other specified dermatitis: Secondary | ICD-10-CM | POA: Diagnosis not present

## 2022-04-25 DIAGNOSIS — L98499 Non-pressure chronic ulcer of skin of other sites with unspecified severity: Secondary | ICD-10-CM | POA: Diagnosis not present

## 2022-04-25 DIAGNOSIS — L905 Scar conditions and fibrosis of skin: Secondary | ICD-10-CM | POA: Diagnosis not present

## 2022-04-28 DIAGNOSIS — L03115 Cellulitis of right lower limb: Secondary | ICD-10-CM | POA: Diagnosis not present

## 2022-05-02 DIAGNOSIS — F331 Major depressive disorder, recurrent, moderate: Secondary | ICD-10-CM | POA: Diagnosis not present

## 2022-05-06 DIAGNOSIS — R102 Pelvic and perineal pain: Secondary | ICD-10-CM | POA: Diagnosis not present

## 2022-05-06 DIAGNOSIS — D259 Leiomyoma of uterus, unspecified: Secondary | ICD-10-CM | POA: Diagnosis not present

## 2022-05-30 DIAGNOSIS — F331 Major depressive disorder, recurrent, moderate: Secondary | ICD-10-CM | POA: Diagnosis not present

## 2022-05-30 DIAGNOSIS — D251 Intramural leiomyoma of uterus: Secondary | ICD-10-CM | POA: Diagnosis not present

## 2022-05-30 DIAGNOSIS — R102 Pelvic and perineal pain: Secondary | ICD-10-CM | POA: Diagnosis not present

## 2022-06-27 DIAGNOSIS — F331 Major depressive disorder, recurrent, moderate: Secondary | ICD-10-CM | POA: Diagnosis not present

## 2022-06-29 IMAGING — RF DG ESOPHAGUS
7 of 8 series · 21 of 24 positions shown · non-contrast
Comparison: 07/03/2014 chest radiograph. Endoscopy report of
02/21/2020

CLINICAL DATA: Rule out motility disorder.  Severe acid reflux.

EXAM:
ESOPHOGRAM/BARIUM SWALLOW
TECHNIQUE: Combined double contrast and single contrast examination performed
using effervescent crystals, thick barium liquid, and thin barium
liquid.
FLUOROSCOPY TIME:  Fluoroscopy Time:  2 minutes and 36 seconds
Radiation Exposure Index (if provided by the fluoroscopic device):
27.4 mGy
Number of Acquired Spot Images: 0

[Series 1: cp_standard · 0.34mm/px · 3 of 27 frames shown (1 of 7)]
[frame 1/27]
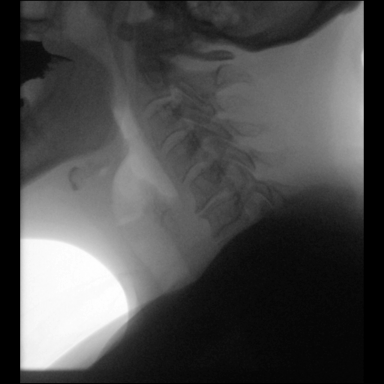
[frame 5/27]
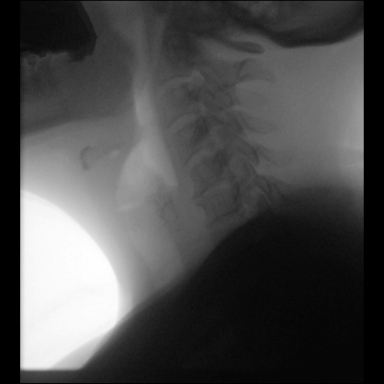
[frame 14/27]
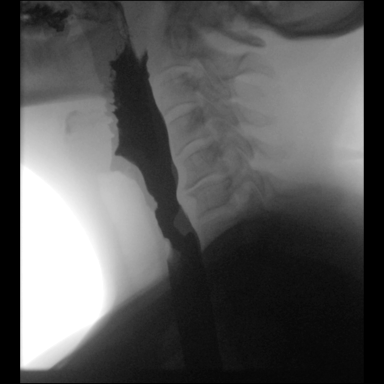

[Series 2: cp_standard · 0.51mm/px · 3 of 132 frames shown (2 of 7)]
[frame 20/132]
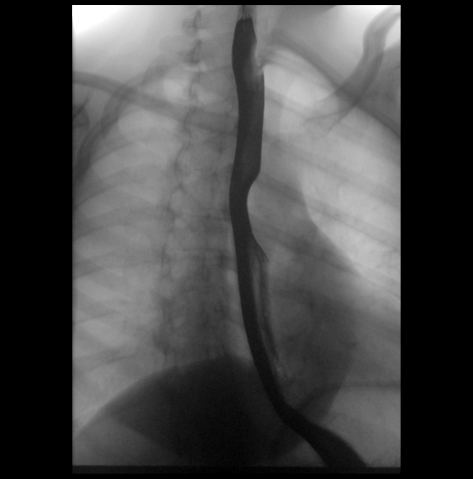
[frame 32/132]
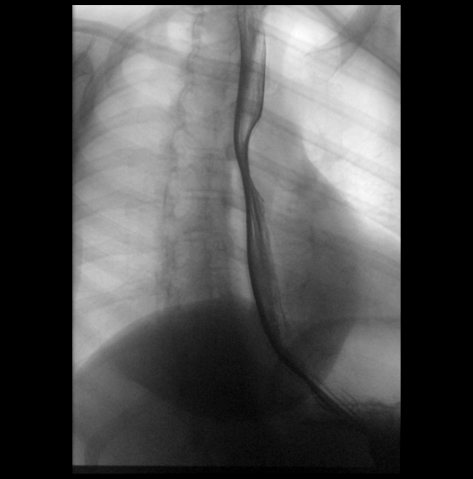
[frame 113/132]
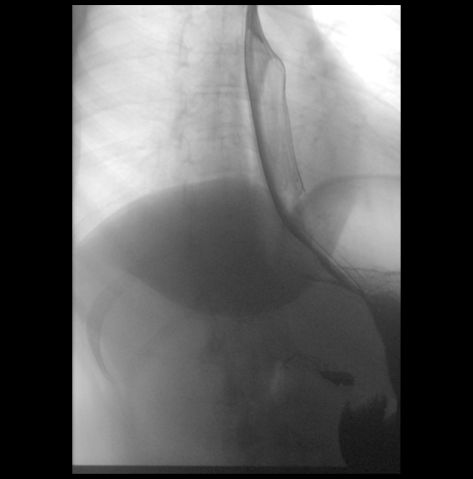

[Series 3: cp_standard · 0.53mm/px · 4 of 70 frames shown (3 of 7)]
[frame 11/70]
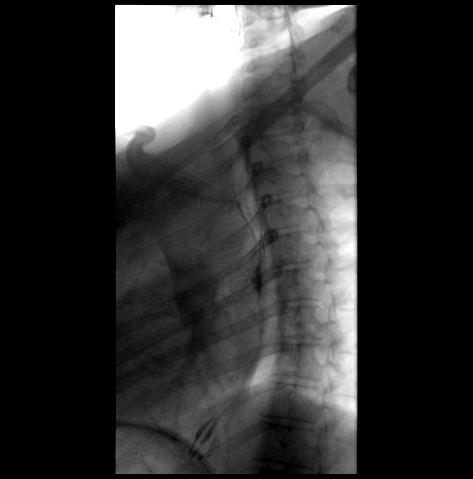
[frame 36/70]
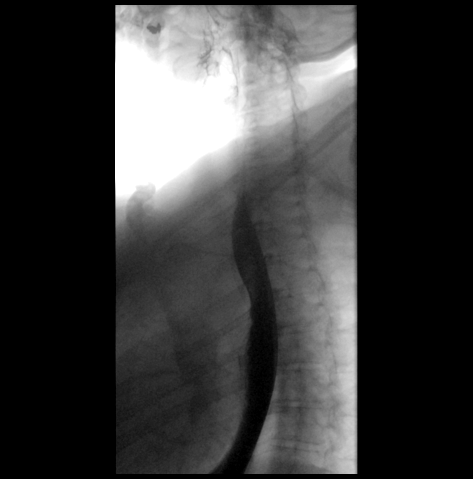
[frame 60/70]
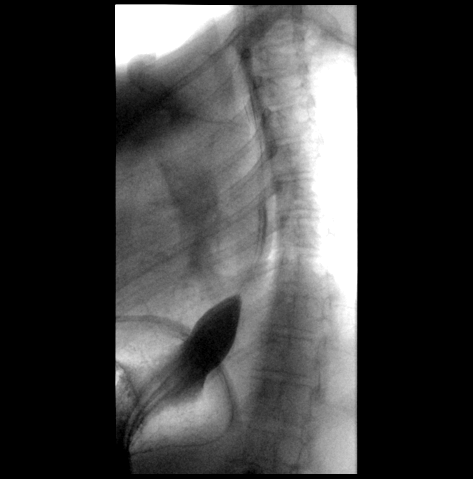
[frame 64/70]
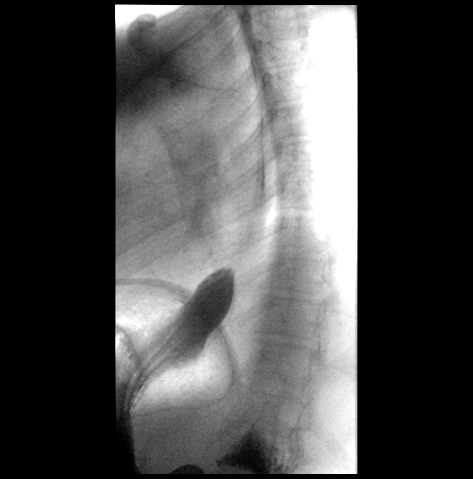

[Series 4: cp_standard · 0.52mm/px · 3 of 83 frames shown (4 of 7)]
[frame 42/83]
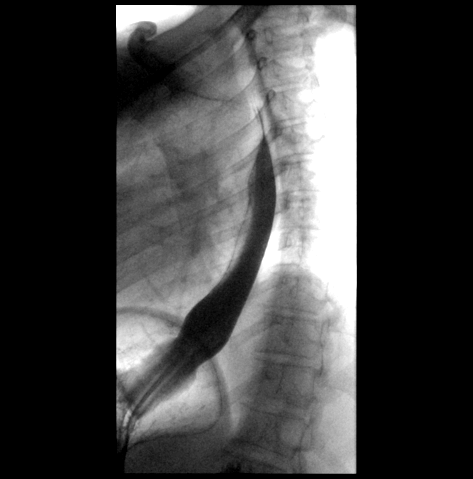
[frame 71/83]
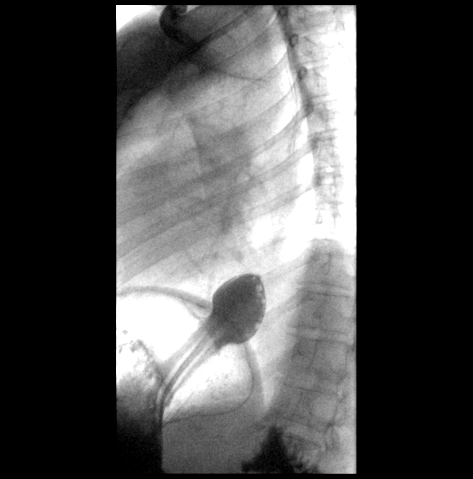
[frame 81/83]
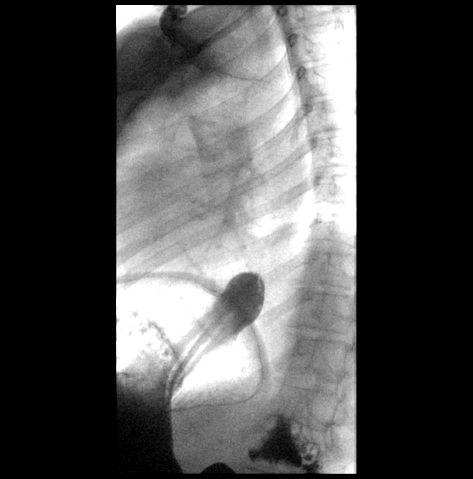

[Series 5: cp_standard · 0.52mm/px · 3 of 170 frames shown (5 of 7)]
[frame 26/170]
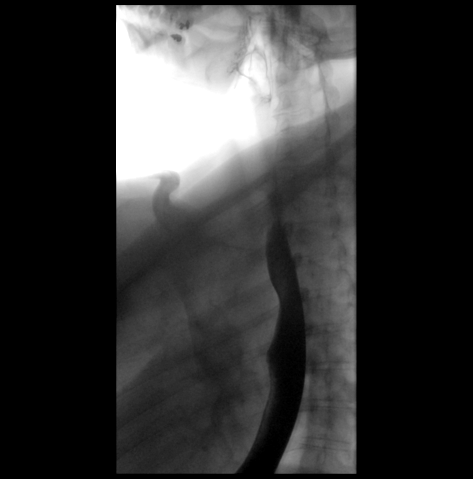
[frame 86/170]
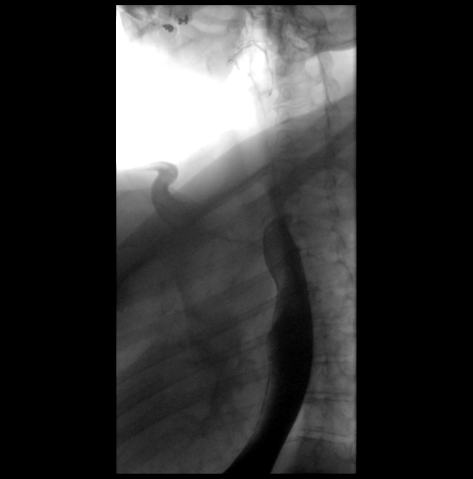
[frame 145/170]
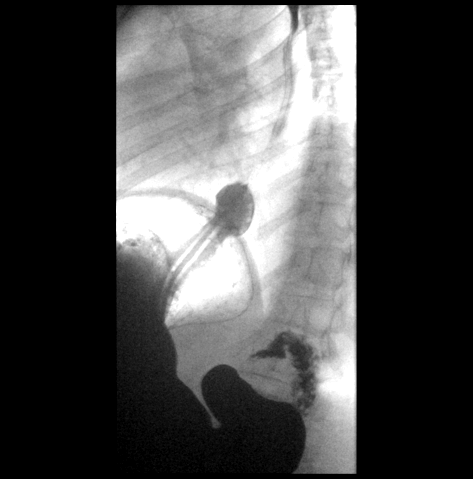

[Series 6: cp_standard · 0.26mm/px · 1 of 1 slices shown (6 of 7)]
[im 1/1]
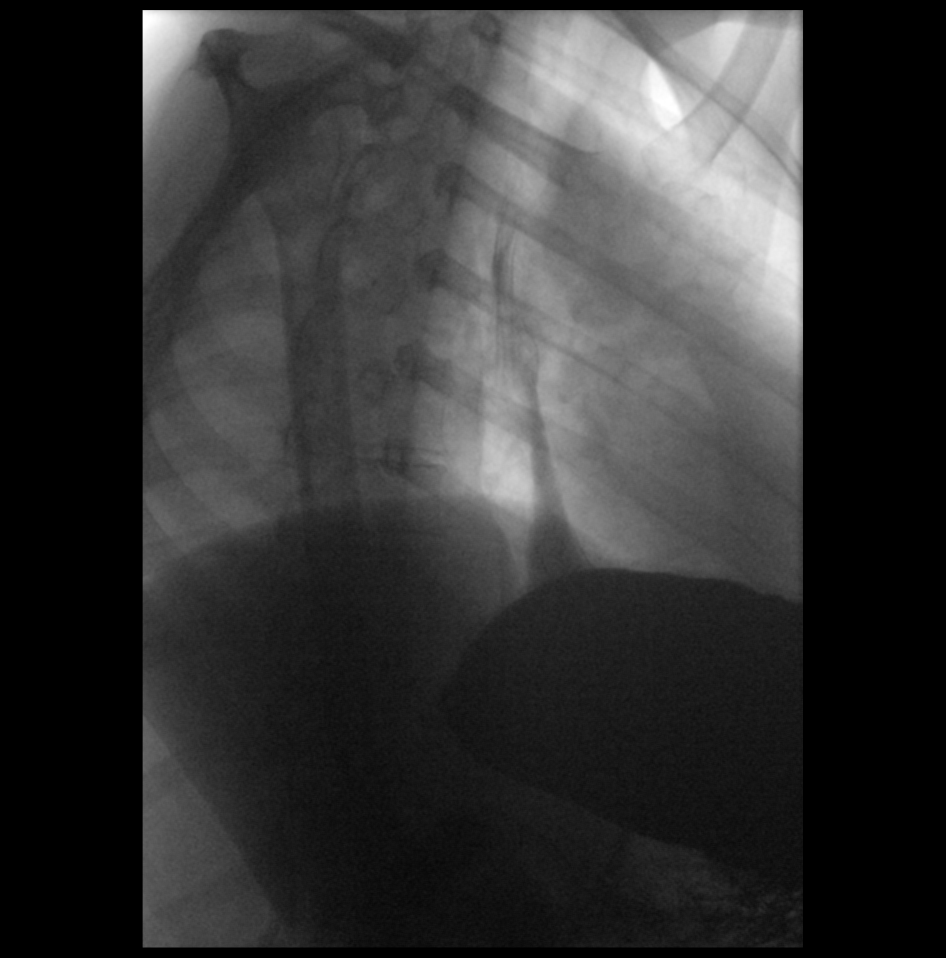

[Series 8: cp_standard · 0.53mm/px · 4 of 92 frames shown (7 of 7)]
[frame 14/92]
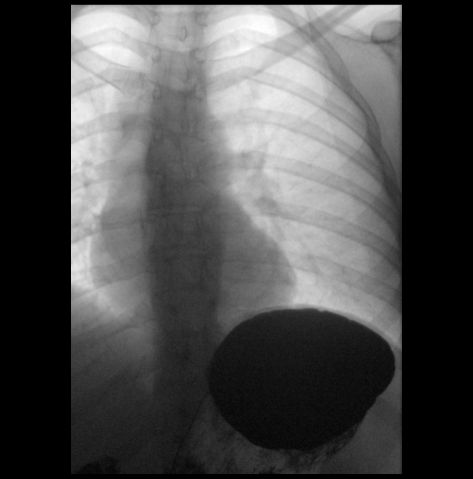
[frame 47/92]
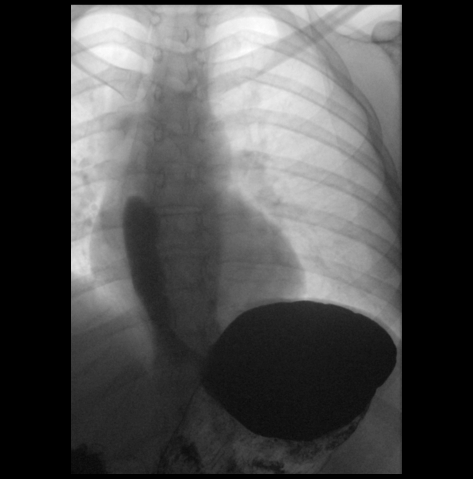
[frame 77/92]
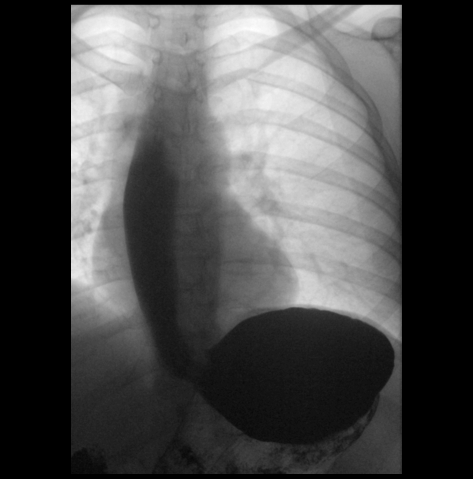
[frame 79/92]
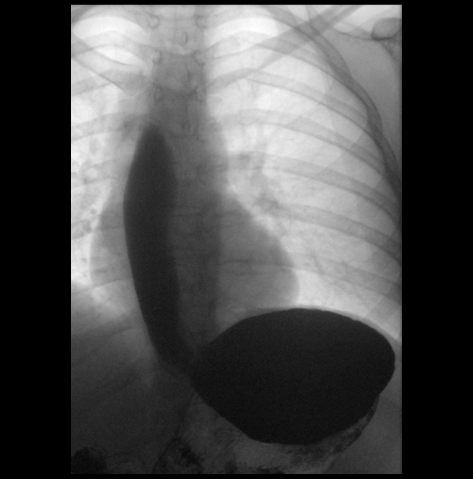

[21 of 24 positions shown; findings below may reference images not displayed]

FINDINGS: Evaluation of the hypopharynx demonstrates a mildly prominent
cricopharyngeus muscle.

Double contrast evaluation of the esophagus demonstrates no mucosal
abnormality.

Evaluation of primary peristalsis demonstrates a normal primary
peristaltic wave on each swallow.

Full column evaluation of the esophagus demonstrates no persistent
narrowing or stricture. No significant hiatal hernia.

With water swallows, there is gastroesophageal reflux including 2
above the craniocaudal level of the carina, including on series 8.
IMPRESSION: Gastroesophageal reflux, especially with water swallows.

Otherwise, normal esophagram. No hiatal hernia or esophageal
stricture identified.

Normal esophageal motility.

## 2022-07-05 DIAGNOSIS — Z8659 Personal history of other mental and behavioral disorders: Secondary | ICD-10-CM | POA: Diagnosis not present

## 2022-07-05 DIAGNOSIS — F411 Generalized anxiety disorder: Secondary | ICD-10-CM | POA: Diagnosis not present

## 2022-07-11 DIAGNOSIS — F331 Major depressive disorder, recurrent, moderate: Secondary | ICD-10-CM | POA: Diagnosis not present

## 2022-07-18 DIAGNOSIS — Z131 Encounter for screening for diabetes mellitus: Secondary | ICD-10-CM | POA: Diagnosis not present

## 2022-07-18 DIAGNOSIS — Z6833 Body mass index (BMI) 33.0-33.9, adult: Secondary | ICD-10-CM | POA: Diagnosis not present

## 2022-07-18 DIAGNOSIS — Z13220 Encounter for screening for lipoid disorders: Secondary | ICD-10-CM | POA: Diagnosis not present

## 2022-07-18 DIAGNOSIS — R002 Palpitations: Secondary | ICD-10-CM | POA: Diagnosis not present

## 2022-07-25 DIAGNOSIS — F331 Major depressive disorder, recurrent, moderate: Secondary | ICD-10-CM | POA: Diagnosis not present

## 2022-07-29 DIAGNOSIS — R400 Somnolence: Secondary | ICD-10-CM | POA: Diagnosis not present

## 2022-07-29 DIAGNOSIS — R0683 Snoring: Secondary | ICD-10-CM | POA: Diagnosis not present

## 2022-07-29 DIAGNOSIS — Z0000 Encounter for general adult medical examination without abnormal findings: Secondary | ICD-10-CM | POA: Diagnosis not present

## 2022-07-29 DIAGNOSIS — R5382 Chronic fatigue, unspecified: Secondary | ICD-10-CM | POA: Diagnosis not present

## 2022-07-30 DIAGNOSIS — R5382 Chronic fatigue, unspecified: Secondary | ICD-10-CM | POA: Diagnosis not present

## 2022-07-30 DIAGNOSIS — N920 Excessive and frequent menstruation with regular cycle: Secondary | ICD-10-CM | POA: Diagnosis not present

## 2022-07-30 DIAGNOSIS — Z79899 Other long term (current) drug therapy: Secondary | ICD-10-CM | POA: Diagnosis not present

## 2022-07-30 DIAGNOSIS — E7801 Familial hypercholesterolemia: Secondary | ICD-10-CM | POA: Diagnosis not present

## 2022-08-08 DIAGNOSIS — F331 Major depressive disorder, recurrent, moderate: Secondary | ICD-10-CM | POA: Diagnosis not present

## 2022-08-16 DIAGNOSIS — Z01812 Encounter for preprocedural laboratory examination: Secondary | ICD-10-CM | POA: Diagnosis not present

## 2022-08-22 DIAGNOSIS — F331 Major depressive disorder, recurrent, moderate: Secondary | ICD-10-CM | POA: Diagnosis not present

## 2022-08-23 DIAGNOSIS — D251 Intramural leiomyoma of uterus: Secondary | ICD-10-CM | POA: Diagnosis not present

## 2022-08-23 DIAGNOSIS — N711 Chronic inflammatory disease of uterus: Secondary | ICD-10-CM | POA: Diagnosis not present

## 2022-08-23 DIAGNOSIS — N946 Dysmenorrhea, unspecified: Secondary | ICD-10-CM | POA: Diagnosis not present

## 2022-08-23 DIAGNOSIS — R102 Pelvic and perineal pain: Secondary | ICD-10-CM | POA: Diagnosis not present

## 2022-09-05 DIAGNOSIS — F331 Major depressive disorder, recurrent, moderate: Secondary | ICD-10-CM | POA: Diagnosis not present

## 2022-09-16 DIAGNOSIS — Z01812 Encounter for preprocedural laboratory examination: Secondary | ICD-10-CM | POA: Diagnosis not present

## 2022-09-17 DIAGNOSIS — D251 Intramural leiomyoma of uterus: Secondary | ICD-10-CM | POA: Diagnosis not present

## 2022-09-17 DIAGNOSIS — N8000 Endometriosis of the uterus, unspecified: Secondary | ICD-10-CM | POA: Diagnosis not present

## 2022-09-17 DIAGNOSIS — N946 Dysmenorrhea, unspecified: Secondary | ICD-10-CM | POA: Diagnosis not present

## 2022-09-17 DIAGNOSIS — N736 Female pelvic peritoneal adhesions (postinfective): Secondary | ICD-10-CM | POA: Diagnosis not present

## 2022-09-17 DIAGNOSIS — D252 Subserosal leiomyoma of uterus: Secondary | ICD-10-CM | POA: Diagnosis not present

## 2022-09-17 DIAGNOSIS — D271 Benign neoplasm of left ovary: Secondary | ICD-10-CM | POA: Diagnosis not present

## 2022-09-17 DIAGNOSIS — K219 Gastro-esophageal reflux disease without esophagitis: Secondary | ICD-10-CM | POA: Diagnosis not present

## 2022-09-17 DIAGNOSIS — N80222 Deep endometriosis of left fallopian tube: Secondary | ICD-10-CM | POA: Diagnosis not present

## 2022-09-17 DIAGNOSIS — D259 Leiomyoma of uterus, unspecified: Secondary | ICD-10-CM | POA: Diagnosis not present

## 2022-09-18 DIAGNOSIS — N8000 Endometriosis of the uterus, unspecified: Secondary | ICD-10-CM | POA: Diagnosis not present

## 2022-09-18 DIAGNOSIS — D271 Benign neoplasm of left ovary: Secondary | ICD-10-CM | POA: Diagnosis not present

## 2022-09-18 DIAGNOSIS — N946 Dysmenorrhea, unspecified: Secondary | ICD-10-CM | POA: Diagnosis not present

## 2022-09-18 DIAGNOSIS — D251 Intramural leiomyoma of uterus: Secondary | ICD-10-CM | POA: Diagnosis not present

## 2022-09-18 DIAGNOSIS — D259 Leiomyoma of uterus, unspecified: Secondary | ICD-10-CM | POA: Diagnosis not present

## 2022-09-18 DIAGNOSIS — K219 Gastro-esophageal reflux disease without esophagitis: Secondary | ICD-10-CM | POA: Diagnosis not present

## 2022-09-18 DIAGNOSIS — D252 Subserosal leiomyoma of uterus: Secondary | ICD-10-CM | POA: Diagnosis not present

## 2022-09-18 DIAGNOSIS — N80222 Deep endometriosis of left fallopian tube: Secondary | ICD-10-CM | POA: Diagnosis not present

## 2022-09-18 DIAGNOSIS — N736 Female pelvic peritoneal adhesions (postinfective): Secondary | ICD-10-CM | POA: Diagnosis not present

## 2022-09-24 DIAGNOSIS — F331 Major depressive disorder, recurrent, moderate: Secondary | ICD-10-CM | POA: Diagnosis not present

## 2022-10-01 DIAGNOSIS — R6883 Chills (without fever): Secondary | ICD-10-CM | POA: Diagnosis not present

## 2022-10-01 DIAGNOSIS — I440 Atrioventricular block, first degree: Secondary | ICD-10-CM | POA: Diagnosis not present

## 2022-10-01 DIAGNOSIS — R109 Unspecified abdominal pain: Secondary | ICD-10-CM | POA: Diagnosis not present

## 2022-10-01 DIAGNOSIS — R112 Nausea with vomiting, unspecified: Secondary | ICD-10-CM | POA: Diagnosis not present

## 2022-10-01 DIAGNOSIS — R1033 Periumbilical pain: Secondary | ICD-10-CM | POA: Diagnosis not present

## 2022-10-01 DIAGNOSIS — A419 Sepsis, unspecified organism: Secondary | ICD-10-CM | POA: Diagnosis not present

## 2022-10-01 DIAGNOSIS — N83201 Unspecified ovarian cyst, right side: Secondary | ICD-10-CM | POA: Diagnosis not present

## 2022-10-01 DIAGNOSIS — R1013 Epigastric pain: Secondary | ICD-10-CM | POA: Diagnosis not present

## 2022-10-17 DIAGNOSIS — F331 Major depressive disorder, recurrent, moderate: Secondary | ICD-10-CM | POA: Diagnosis not present
